# Patient Record
Sex: Male | Born: 1940 | Race: White | Hispanic: No | Smoking: Former smoker
Health system: Southern US, Community
[De-identification: ages and names within clinical notes are randomized; demographics above are authoritative.]

## PROBLEM LIST (undated history)

## (undated) DIAGNOSIS — I2781 Cor pulmonale (chronic): Secondary | ICD-10-CM

## (undated) DIAGNOSIS — I272 Pulmonary hypertension, unspecified: Secondary | ICD-10-CM

## (undated) DIAGNOSIS — K560 Paralytic ileus: Secondary | ICD-10-CM

## (undated) DIAGNOSIS — I4891 Unspecified atrial fibrillation: Secondary | ICD-10-CM

## (undated) DIAGNOSIS — K922 Gastrointestinal hemorrhage, unspecified: Secondary | ICD-10-CM

## (undated) DIAGNOSIS — G4730 Sleep apnea, unspecified: Secondary | ICD-10-CM

## (undated) DIAGNOSIS — Z7901 Long term (current) use of anticoagulants: Secondary | ICD-10-CM

## (undated) DIAGNOSIS — G4733 Obstructive sleep apnea (adult) (pediatric): Principal | ICD-10-CM

## (undated) DIAGNOSIS — K635 Polyp of colon: Secondary | ICD-10-CM

## (undated) DIAGNOSIS — E7800 Pure hypercholesterolemia, unspecified: Secondary | ICD-10-CM

## (undated) DIAGNOSIS — R0602 Shortness of breath: Secondary | ICD-10-CM

## (undated) DIAGNOSIS — I8393 Asymptomatic varicose veins of bilateral lower extremities: Secondary | ICD-10-CM

## (undated) DIAGNOSIS — K8020 Calculus of gallbladder without cholecystitis without obstruction: Secondary | ICD-10-CM

## (undated) DIAGNOSIS — M1990 Unspecified osteoarthritis, unspecified site: Secondary | ICD-10-CM

## (undated) DIAGNOSIS — F419 Anxiety disorder, unspecified: Secondary | ICD-10-CM

## (undated) DIAGNOSIS — I10 Essential (primary) hypertension: Secondary | ICD-10-CM

## (undated) DIAGNOSIS — K259 Gastric ulcer, unspecified as acute or chronic, without hemorrhage or perforation: Secondary | ICD-10-CM

## (undated) DIAGNOSIS — I50812 Chronic right heart failure: Secondary | ICD-10-CM

## (undated) DIAGNOSIS — K219 Gastro-esophageal reflux disease without esophagitis: Secondary | ICD-10-CM

## (undated) HISTORY — DX: Gastro-esophageal reflux disease without esophagitis: K21.9

## (undated) HISTORY — DX: Sleep apnea, unspecified: G47.30

## (undated) HISTORY — DX: Polyp of colon: K63.5

## (undated) HISTORY — PX: COLONOSCOPY: SHX174

## (undated) HISTORY — DX: Calculus of gallbladder without cholecystitis without obstruction: K80.20

## (undated) HISTORY — DX: Obstructive sleep apnea (adult) (pediatric): G47.33

## (undated) HISTORY — PX: MOUTH SURGERY: SHX715

## (undated) HISTORY — DX: Gastrointestinal hemorrhage, unspecified: K92.2

## (undated) HISTORY — PX: OTHER SURGICAL HISTORY: SHX169

## (undated) HISTORY — DX: Gastric ulcer, unspecified as acute or chronic, without hemorrhage or perforation: K25.9

## (undated) HISTORY — PX: CARDIOVASCULAR STRESS TEST: SHX262

## (undated) HISTORY — DX: Paralytic ileus: K56.0

---

## 2000-01-23 ENCOUNTER — Emergency Department (HOSPITAL_COMMUNITY): Admission: EM | Admit: 2000-01-23 | Discharge: 2000-01-23 | Payer: Self-pay | Admitting: Emergency Medicine

## 2000-02-23 ENCOUNTER — Encounter: Payer: Self-pay | Admitting: Emergency Medicine

## 2000-02-24 ENCOUNTER — Inpatient Hospital Stay (HOSPITAL_COMMUNITY): Admission: EM | Admit: 2000-02-24 | Discharge: 2000-02-27 | Payer: Self-pay | Admitting: Emergency Medicine

## 2000-02-24 ENCOUNTER — Encounter: Payer: Self-pay | Admitting: Surgery

## 2000-02-25 ENCOUNTER — Encounter: Payer: Self-pay | Admitting: Surgery

## 2003-04-25 ENCOUNTER — Emergency Department (HOSPITAL_COMMUNITY): Admission: EM | Admit: 2003-04-25 | Discharge: 2003-04-25 | Payer: Self-pay | Admitting: *Deleted

## 2003-04-25 ENCOUNTER — Encounter: Payer: Self-pay | Admitting: Emergency Medicine

## 2006-11-16 ENCOUNTER — Emergency Department (HOSPITAL_COMMUNITY): Admission: EM | Admit: 2006-11-16 | Discharge: 2006-11-17 | Payer: Self-pay | Admitting: Emergency Medicine

## 2006-11-16 ENCOUNTER — Ambulatory Visit: Payer: Self-pay | Admitting: Psychiatry

## 2006-11-17 ENCOUNTER — Inpatient Hospital Stay (HOSPITAL_COMMUNITY): Admission: AD | Admit: 2006-11-17 | Discharge: 2006-11-23 | Payer: Self-pay | Admitting: Psychiatry

## 2006-11-26 ENCOUNTER — Other Ambulatory Visit (HOSPITAL_COMMUNITY): Admission: RE | Admit: 2006-11-26 | Discharge: 2007-02-24 | Payer: Self-pay | Admitting: Psychiatry

## 2011-06-15 ENCOUNTER — Other Ambulatory Visit: Payer: Self-pay | Admitting: Cardiology

## 2011-06-15 DIAGNOSIS — I872 Venous insufficiency (chronic) (peripheral): Secondary | ICD-10-CM

## 2011-06-15 DIAGNOSIS — I83899 Varicose veins of unspecified lower extremities with other complications: Secondary | ICD-10-CM

## 2011-06-15 DIAGNOSIS — I8390 Asymptomatic varicose veins of unspecified lower extremity: Secondary | ICD-10-CM

## 2011-06-27 ENCOUNTER — Other Ambulatory Visit: Payer: Self-pay

## 2011-06-28 ENCOUNTER — Ambulatory Visit
Admission: RE | Admit: 2011-06-28 | Discharge: 2011-06-28 | Disposition: A | Discharge status: Home / Self Care | Payer: Medicare Other | Source: Ambulatory Visit | Attending: Cardiology | Admitting: Cardiology

## 2011-06-28 DIAGNOSIS — I83899 Varicose veins of unspecified lower extremities with other complications: Secondary | ICD-10-CM

## 2011-06-28 DIAGNOSIS — I8390 Asymptomatic varicose veins of unspecified lower extremity: Secondary | ICD-10-CM

## 2011-06-28 DIAGNOSIS — I872 Venous insufficiency (chronic) (peripheral): Secondary | ICD-10-CM

## 2011-06-28 HISTORY — DX: Morbid (severe) obesity due to excess calories: E66.01

## 2011-06-28 HISTORY — DX: Asymptomatic varicose veins of bilateral lower extremities: I83.93

## 2011-06-28 HISTORY — DX: Long term (current) use of anticoagulants: Z79.01

## 2011-06-28 HISTORY — DX: Pure hypercholesterolemia, unspecified: E78.00

## 2011-06-28 HISTORY — DX: Essential (primary) hypertension: I10

## 2011-06-28 HISTORY — DX: Unspecified atrial fibrillation: I48.91

## 2011-06-28 NOTE — Progress Notes (Signed)
Bilateral edema of lower extremities, Right greater than Left.  Denies pain.  Burning sensation.  Skin color changes.  Thickened nails & skin of lower extremities.    Right foot discomfort during ambulation.  Not currently exercising.

## 2012-05-16 HISTORY — PX: TRANSTHORACIC ECHOCARDIOGRAM: SHX275

## 2012-10-24 ENCOUNTER — Encounter: Payer: Medicare Other | Attending: Family Medicine | Admitting: Dietician

## 2012-10-24 VITALS — Ht 72.0 in | Wt 328.7 lb

## 2012-10-24 DIAGNOSIS — E119 Type 2 diabetes mellitus without complications: Secondary | ICD-10-CM | POA: Insufficient documentation

## 2012-10-24 DIAGNOSIS — Z713 Dietary counseling and surveillance: Secondary | ICD-10-CM | POA: Insufficient documentation

## 2012-10-26 ENCOUNTER — Encounter: Payer: Self-pay | Admitting: Dietician

## 2012-10-26 NOTE — Progress Notes (Signed)
  Patient was seen on 10/24/2012 for the first of a series of three diabetes self-management courses at the [facility]. The following learning objectives were met by the patient during this course: Current A1C: 6.0  10/02/2012  Defines the role of glucose and insulin  Identifies type of diabetes and pathophysiology  Defines the diagnostic criteria for diabetes and prediabetes  States the risk factors for Type 2 Diabetes  States the symptoms of Type 2 Diabetes  Defines Type 2 Diabetes treatment goals  Defines Type 2 Diabetes treatment options  States the rationale for glucose monitoring  Identifies A1C, glucose targets, and testing times  Identifies proper sharps disposal  Defines the purpose of a diabetes food plan  Identifies carbohydrate food groups  Defines effects of carbohydrate foods on glucose levels  Identifies carbohydrate choices/grams/food labels  States benefits of physical activity and effect on glucose  Review of suggested activity guidelines  Handouts given during class include:  Type 2 Diabetes: Basics Book  My Food Plan Book  Food and Activity Log  Patient has established the following initial goals:  Follow a diabetes Meal Plan  Increase exercise  Lose weight  Follow-Up Plan: Take DM Core Classes 2 & 3

## 2012-10-31 ENCOUNTER — Encounter: Payer: Medicare Other | Admitting: Dietician

## 2012-10-31 DIAGNOSIS — E119 Type 2 diabetes mellitus without complications: Secondary | ICD-10-CM

## 2012-10-31 NOTE — Progress Notes (Signed)
  Patient was seen on 10/31/2012 for the second of a series of three diabetes self-management courses at the [facility]. The following learning objectives were met by the patient during this course:   Explain basic nutrition maintenance and quality assurance  Describe causes, symptoms and treatment of hypoglycemia and hyperglycemia  Explain how to manage diabetes during illness  Describe the importance of good nutrition for health and healthy eating strategies  List strategies to follow meal plan when dining out  Describe the effects of alcohol on glucose and how to use it safely  Describe problem solving skills for day-to-day glucose challenges  Describe strategies to use when treatment plan needs to change  Identify important factors involved in successful weight loss  Describe ways to remain physically active  Describe the impact of regular activity on insulin resistance  Handouts given in class:  Refrigerator magnet for Sick Day Guidelines  [facility] Oral medication/insulin handout  Nutritional Strategies for Weight Loss with Diabetes  Follow-Up Plan: Patient will attend the final class of the ADA Diabetes Self-Care Education.

## 2012-11-14 ENCOUNTER — Encounter: Payer: Medicare Other | Admitting: *Deleted

## 2012-11-14 DIAGNOSIS — E119 Type 2 diabetes mellitus without complications: Secondary | ICD-10-CM

## 2012-11-14 NOTE — Patient Instructions (Signed)
Goals:  Follow Diabetes Meal Plan as instructed  Eat 3 meals and 2 snacks, every 3-5 hrs  Limit carbohydrate intake to 60 grams carbohydrate/meal  Limit carbohydrate intake to 30 grams carbohydrate/snack  Add lean protein foods to meals/snacks  Monitor glucose levels as instructed by your doctor  Aim for 30 mins of physical activity daily  Bring food record and glucose log to your next nutrition visit   

## 2012-11-14 NOTE — Progress Notes (Signed)
  Patient was seen on 11/14/12 for the third of a series of three diabetes self-management courses at the [facility]. The following learning objectives were met by the patient during this course:    Describe how diabetes changes over time   Identify diabetes complications and ways to prevent them   Describe strategies that can promote heart health including lowering blood pressure and cholesterol   Describe strategies to lower dietary fat and sodium in the diet   Identify physical activities that benefit cardiovascular health   Evaluate success in meeting personal goal   Describe the belief that they can live successfully with diabetes day to day   Establish 2-3 goals that they will plan to diligently work on until they return for the free 90-month follow-up visit  The following handouts were given in class:  3 Month Follow Up Visit handout  Goal setting handout  Class evaluation form  Your patient has established the following 3 month goals for diabetes self-care:  Count carbohydrates at most meals and snacks  Set a plan for exercise  Start testing glucose and look for patterns in record at least 8 days a month  Follow-Up Plan: Patient will attend a 3 month follow-up visit for diabetes self-management education.

## 2013-02-11 ENCOUNTER — Ambulatory Visit: Payer: Medicare Other | Admitting: Dietician

## 2013-02-13 ENCOUNTER — Ambulatory Visit: Payer: Medicare Other | Admitting: *Deleted

## 2013-02-18 ENCOUNTER — Ambulatory Visit: Payer: Medicare Other | Admitting: Dietician

## 2013-03-25 ENCOUNTER — Other Ambulatory Visit: Payer: Self-pay | Admitting: Orthopedic Surgery

## 2013-04-10 ENCOUNTER — Encounter (HOSPITAL_BASED_OUTPATIENT_CLINIC_OR_DEPARTMENT_OTHER): Payer: Self-pay | Admitting: *Deleted

## 2013-04-10 NOTE — Progress Notes (Signed)
REVIEWED PT CHART AND NOTE FROM CARDIOLOGIST, DR Jacinto Halim, FAXED FROM DR Simonne Come. ALSO, STARTED HX INTERVIEW VIA PHONE, PT IS 5'11" AND 360LB (PT STATES THIS WT DONE AT PCP THIS WEEK).  PT IS OVER OUR WT LIMIT PER POLICY AND SIG. CARDIAC ISSUES PT NEEDS TO BE DONE AT MAIN OR. CALL AND SPOKE W/ KAREN , OR SCHEDULER FOR DR APLINGTON, INFORMED OF THIS AND THAT PT STATED HE DID NOT HAVE LOVENOX RX AS DR Jacinto Halim HAD RECOMMENDED, KAREN STATES SHE WILL TAKE CARE OF THIS AND CALL PT BY THE END OF THE DAY.

## 2013-04-16 ENCOUNTER — Encounter (HOSPITAL_COMMUNITY): Payer: Self-pay | Admitting: Pharmacy Technician

## 2013-04-17 NOTE — Patient Instructions (Signed)
Edwin [name]  04/17/2013   Your procedure is scheduled on:  04/23/13    Report to [facility] at  1045  AM.  Call this number if you have problems the morning of surgery: [phone]   Remember:   Do not eat food or drink liquids after midnight.   Take these medicines the morning of surgery with A SIP OF WATER:    Do not wear jewelry,  Do not wear lotions, powders, or perfumes.  . Men may shave face and neck.  Do not bring valuables to the hospital.  Contacts, dentures or bridgework may not be worn into surgery.       Patients discharged the day of surgery will not be allowed to drive  home.  Name and phone number of your driver:    SEE CHG INSTRUCTION SHEET    Please read over the following fact sheets that you were given: MRSA Information, coughing and deep breathing exercises, leg exercises               Failure to comply with these instructions may result in cancellation of your surgery.                Patient Signature ____________________________              Nurse Signature _____________________________

## 2013-04-18 ENCOUNTER — Encounter (HOSPITAL_COMMUNITY): Payer: Self-pay

## 2013-04-18 ENCOUNTER — Ambulatory Visit (HOSPITAL_COMMUNITY)
Admission: RE | Admit: 2013-04-18 | Discharge: 2013-04-18 | Disposition: A | Discharge status: Home / Self Care | Payer: MEDICARE | Source: Ambulatory Visit | Attending: Orthopedic Surgery | Admitting: Orthopedic Surgery

## 2013-04-18 ENCOUNTER — Encounter (HOSPITAL_COMMUNITY)
Admission: RE | Admit: 2013-04-18 | Discharge: 2013-04-18 | Disposition: A | Discharge status: Home / Self Care | Payer: MEDICARE | Source: Ambulatory Visit | Attending: Orthopedic Surgery | Admitting: Orthopedic Surgery

## 2013-04-18 ENCOUNTER — Ambulatory Visit (HOSPITAL_BASED_OUTPATIENT_CLINIC_OR_DEPARTMENT_OTHER): Admit: 2013-04-18 | Payer: Medicare Other | Admitting: Orthopedic Surgery

## 2013-04-18 ENCOUNTER — Encounter (HOSPITAL_BASED_OUTPATIENT_CLINIC_OR_DEPARTMENT_OTHER): Payer: Self-pay

## 2013-04-18 DIAGNOSIS — I10 Essential (primary) hypertension: Secondary | ICD-10-CM | POA: Insufficient documentation

## 2013-04-18 DIAGNOSIS — R0602 Shortness of breath: Secondary | ICD-10-CM | POA: Insufficient documentation

## 2013-04-18 DIAGNOSIS — I517 Cardiomegaly: Secondary | ICD-10-CM | POA: Insufficient documentation

## 2013-04-18 DIAGNOSIS — E119 Type 2 diabetes mellitus without complications: Secondary | ICD-10-CM | POA: Insufficient documentation

## 2013-04-18 DIAGNOSIS — Z01818 Encounter for other preprocedural examination: Secondary | ICD-10-CM | POA: Insufficient documentation

## 2013-04-18 HISTORY — DX: Anxiety disorder, unspecified: F41.9

## 2013-04-18 HISTORY — DX: Unspecified osteoarthritis, unspecified site: M19.90

## 2013-04-18 HISTORY — DX: Chronic right heart failure: I50.812

## 2013-04-18 HISTORY — DX: Pulmonary hypertension, unspecified: I27.20

## 2013-04-18 HISTORY — DX: Cor pulmonale (chronic): I27.81

## 2013-04-18 HISTORY — DX: Shortness of breath: R06.02

## 2013-04-18 LAB — CBC
Hemoglobin: 12.9 g/dL — ABNORMAL LOW (ref 13.0–17.0)
MCH: 31.5 pg (ref 26.0–34.0)
MCHC: 33.8 g/dL (ref 30.0–36.0)
MCV: 93.4 fL (ref 78.0–100.0)

## 2013-04-18 LAB — BASIC METABOLIC PANEL
BUN: 9 mg/dL (ref 6–23)
Calcium: 8.9 mg/dL (ref 8.4–10.5)
Creatinine, Ser: 0.96 mg/dL (ref 0.50–1.35)
GFR calc non Af Amer: 82 mL/min — ABNORMAL LOW (ref >90)
Glucose, Bld: 196 mg/dL — ABNORMAL HIGH (ref 70–99)
Sodium: 134 mEq/L — ABNORMAL LOW (ref 135–145)

## 2013-04-18 LAB — PROTIME-INR: INR: 1.44 (ref 0.00–1.49)

## 2013-04-18 LAB — SURGICAL PCR SCREEN: Staphylococcus aureus: NEGATIVE

## 2013-04-18 SURGERY — ARTHROSCOPY, KNEE, WITH MEDIAL MENISCECTOMY
Anesthesia: General | Site: Knee | Laterality: Left

## 2013-04-18 NOTE — Progress Notes (Signed)
05/29/11 Carotid study on chart ECHO 05/16/12 on chart  Stress 07/19/12 on chart  01/13/13 Last office visit note with Dr Jacinto Halim on chart  EKG 01/13/13 on chart

## 2013-04-18 NOTE — Progress Notes (Signed)
Your patient has screened at an elevated risk for Obstructive Sleep Apnea using the STOP-Bang tool during a presurgical visit.  A score of 4 or greater is considered an elevated risk.  

## 2013-04-23 ENCOUNTER — Encounter (HOSPITAL_COMMUNITY)
Admission: RE | Disposition: A | Discharge status: Home / Self Care | Payer: Self-pay | Source: Ambulatory Visit | Attending: Orthopedic Surgery

## 2013-04-23 ENCOUNTER — Ambulatory Visit (HOSPITAL_COMMUNITY)
Admission: RE | Admit: 2013-04-23 | Discharge: 2013-04-23 | Disposition: A | Discharge status: Home / Self Care | Payer: MEDICARE | Source: Ambulatory Visit | Attending: Orthopedic Surgery | Admitting: Orthopedic Surgery

## 2013-04-23 ENCOUNTER — Ambulatory Visit (HOSPITAL_COMMUNITY): Payer: MEDICARE | Admitting: Anesthesiology

## 2013-04-23 ENCOUNTER — Encounter (HOSPITAL_COMMUNITY): Payer: Self-pay | Admitting: Anesthesiology

## 2013-04-23 ENCOUNTER — Encounter (HOSPITAL_COMMUNITY): Payer: Self-pay | Admitting: *Deleted

## 2013-04-23 DIAGNOSIS — Z7901 Long term (current) use of anticoagulants: Secondary | ICD-10-CM | POA: Insufficient documentation

## 2013-04-23 DIAGNOSIS — I10 Essential (primary) hypertension: Secondary | ICD-10-CM | POA: Insufficient documentation

## 2013-04-23 DIAGNOSIS — I509 Heart failure, unspecified: Secondary | ICD-10-CM | POA: Insufficient documentation

## 2013-04-23 DIAGNOSIS — Z9889 Other specified postprocedural states: Secondary | ICD-10-CM

## 2013-04-23 DIAGNOSIS — G4730 Sleep apnea, unspecified: Secondary | ICD-10-CM | POA: Insufficient documentation

## 2013-04-23 DIAGNOSIS — IMO0002 Reserved for concepts with insufficient information to code with codable children: Secondary | ICD-10-CM | POA: Insufficient documentation

## 2013-04-23 DIAGNOSIS — Z79899 Other long term (current) drug therapy: Secondary | ICD-10-CM | POA: Insufficient documentation

## 2013-04-23 DIAGNOSIS — X58XXXA Exposure to other specified factors, initial encounter: Secondary | ICD-10-CM | POA: Insufficient documentation

## 2013-04-23 DIAGNOSIS — E119 Type 2 diabetes mellitus without complications: Secondary | ICD-10-CM | POA: Insufficient documentation

## 2013-04-23 DIAGNOSIS — I4891 Unspecified atrial fibrillation: Secondary | ICD-10-CM | POA: Insufficient documentation

## 2013-04-23 HISTORY — PX: KNEE ARTHROSCOPY WITH MEDIAL MENISECTOMY: SHX5651

## 2013-04-23 LAB — GLUCOSE, CAPILLARY

## 2013-04-23 SURGERY — ARTHROSCOPY, KNEE, WITH MEDIAL MENISCECTOMY
Anesthesia: General | Site: Knee | Laterality: Left | Wound class: Clean

## 2013-04-23 MED ORDER — LACTATED RINGERS IR SOLN
Status: DC | PRN
  Administered 2013-04-23: 6000 mL

## 2013-04-23 MED ORDER — MORPHINE SULFATE 4 MG/ML IJ SOLN
INTRAMUSCULAR | Status: AC
  Filled 2013-04-23: qty 1

## 2013-04-23 MED ORDER — PROMETHAZINE HCL 25 MG/ML IJ SOLN: 6.2500 mg | INTRAMUSCULAR | Status: DC | PRN

## 2013-04-23 MED ORDER — POVIDONE-IODINE 7.5 % EX SOLN: Freq: Once | CUTANEOUS | Status: DC

## 2013-04-23 MED ORDER — NEOSTIGMINE METHYLSULFATE 1 MG/ML IJ SOLN
INTRAMUSCULAR | Status: DC | PRN
  Administered 2013-04-23: 3 mg via INTRAVENOUS

## 2013-04-23 MED ORDER — EPINEPHRINE HCL 1 MG/ML IJ SOLN
INTRAMUSCULAR | Status: AC
  Filled 2013-04-23: qty 1

## 2013-04-23 MED ORDER — EPINEPHRINE HCL 1 MG/ML IJ SOLN
INTRAMUSCULAR | Status: DC | PRN
  Administered 2013-04-23 (×2): 1 mg

## 2013-04-23 MED ORDER — PROPOFOL 10 MG/ML IV BOLUS
INTRAVENOUS | Status: DC | PRN
  Administered 2013-04-23: 310 mg via INTRAVENOUS

## 2013-04-23 MED ORDER — BUPIVACAINE-EPINEPHRINE 0.5% -1:200000 IJ SOLN
INTRAMUSCULAR | Status: AC
  Filled 2013-04-23: qty 1

## 2013-04-23 MED ORDER — KETOROLAC TROMETHAMINE 30 MG/ML IJ SOLN
INTRAMUSCULAR | Status: DC | PRN
  Administered 2013-04-23: 30 mg via INTRAVENOUS

## 2013-04-23 MED ORDER — MORPHINE SULFATE 4 MG/ML IJ SOLN
INTRAMUSCULAR | Status: DC | PRN
  Administered 2013-04-23: 4 mg via SUBCUTANEOUS

## 2013-04-23 MED ORDER — ROCURONIUM BROMIDE 100 MG/10ML IV SOLN
INTRAVENOUS | Status: DC | PRN
  Administered 2013-04-23: 20 mg via INTRAVENOUS

## 2013-04-23 MED ORDER — FENTANYL CITRATE 0.05 MG/ML IJ SOLN: 25.0000 ug | INTRAMUSCULAR | Status: DC | PRN

## 2013-04-23 MED ORDER — 0.9 % SODIUM CHLORIDE (POUR BTL) OPTIME
TOPICAL | Status: DC | PRN
  Administered 2013-04-23: 1000 mL

## 2013-04-23 MED ORDER — LIDOCAINE HCL (CARDIAC) 20 MG/ML IV SOLN
INTRAVENOUS | Status: DC | PRN
  Administered 2013-04-23: 100 mg via INTRAVENOUS

## 2013-04-23 MED ORDER — KETOROLAC TROMETHAMINE 30 MG/ML IJ SOLN: 15.0000 mg | Freq: Once | INTRAMUSCULAR | Status: DC | PRN

## 2013-04-23 MED ORDER — BUPIVACAINE-EPINEPHRINE 0.5% -1:200000 IJ SOLN
INTRAMUSCULAR | Status: DC | PRN
  Administered 2013-04-23: 20 mL

## 2013-04-23 MED ORDER — ONDANSETRON HCL 4 MG/2ML IJ SOLN
INTRAMUSCULAR | Status: DC | PRN
  Administered 2013-04-23: 4 mg via INTRAVENOUS

## 2013-04-23 MED ORDER — FENTANYL CITRATE 0.05 MG/ML IJ SOLN
INTRAMUSCULAR | Status: DC | PRN
  Administered 2013-04-23 (×2): 50 ug via INTRAVENOUS

## 2013-04-23 MED ORDER — HYDROCODONE-ACETAMINOPHEN 10-325 MG PO TABS
1.0000 | ORAL_TABLET | Freq: Four times a day (QID) | ORAL | Status: DC | PRN
  Administered 2013-04-23: 1 via ORAL
  Filled 2013-04-23: qty 1

## 2013-04-23 MED ORDER — HYDROCODONE-ACETAMINOPHEN 10-325 MG PO TABS: 1.0000 | ORAL_TABLET | Freq: Four times a day (QID) | ORAL | Status: DC | PRN

## 2013-04-23 MED ORDER — MIDAZOLAM HCL 5 MG/5ML IJ SOLN
INTRAMUSCULAR | Status: DC | PRN
  Administered 2013-04-23: 1 mg via INTRAVENOUS

## 2013-04-23 MED ORDER — LACTATED RINGERS IV SOLN
INTRAVENOUS | Status: DC
  Administered 2013-04-23: 1000 mL via INTRAVENOUS

## 2013-04-23 MED ORDER — GLYCOPYRROLATE 0.2 MG/ML IJ SOLN
INTRAMUSCULAR | Status: DC | PRN
  Administered 2013-04-23: .4 mg via INTRAVENOUS

## 2013-04-23 MED ORDER — SUCCINYLCHOLINE CHLORIDE 20 MG/ML IJ SOLN
INTRAMUSCULAR | Status: DC | PRN
  Administered 2013-04-23: 200 mg via INTRAVENOUS

## 2013-04-23 MED ORDER — DEXAMETHASONE SODIUM PHOSPHATE 10 MG/ML IJ SOLN
INTRAMUSCULAR | Status: DC | PRN
  Administered 2013-04-23: 10 mg via INTRAVENOUS

## 2013-04-23 SURGICAL SUPPLY — 34 items
BANDAGE ELASTIC 6 VELCRO ST LF (GAUZE/BANDAGES/DRESSINGS) ×2 IMPLANT
BANDAGE GAUZE ELAST BULKY 4 IN (GAUZE/BANDAGES/DRESSINGS) ×2 IMPLANT
BLADE 4.2CUDA (BLADE) IMPLANT
BLADE CUDA SHAVER 3.5 (BLADE) ×4 IMPLANT
CLOTH BEACON ORANGE TIMEOUT ST (SAFETY) ×2 IMPLANT
CUFF TOURN SGL QUICK 34 (TOURNIQUET CUFF) ×2
CUFF TRNQT CYL 34X4X40X1 (TOURNIQUET CUFF) ×1 IMPLANT
DRSG EMULSION OIL 3X3 NADH (GAUZE/BANDAGES/DRESSINGS) ×2 IMPLANT
DRSG PAD ABDOMINAL 8X10 ST (GAUZE/BANDAGES/DRESSINGS) ×2 IMPLANT
DURAPREP 26ML APPLICATOR (WOUND CARE) ×2 IMPLANT
ELECT REM PT RETURN 9FT ADLT (ELECTROSURGICAL) ×2
ELECTRODE REM PT RTRN 9FT ADLT (ELECTROSURGICAL) ×1 IMPLANT
GLOVE BIO SURGEON STRL SZ7.5 (GLOVE) ×2 IMPLANT
GLOVE BIO SURGEON STRL SZ8 (GLOVE) ×2 IMPLANT
GLOVE ECLIPSE 8.0 STRL XLNG CF (GLOVE) ×6 IMPLANT
GLOVE INDICATOR 8.0 STRL GRN (GLOVE) ×4 IMPLANT
GLOVE SURG SS PI 7.5 STRL IVOR (GLOVE) ×1 IMPLANT
GLOVE SURG SS PI 8.5 STRL IVOR (GLOVE) ×1
GLOVE SURG SS PI 8.5 STRL STRW (GLOVE) IMPLANT
GOWN STRL REIN 2XL XLG LVL4 (GOWN DISPOSABLE) ×1 IMPLANT
IV LACTATED RINGER IRRG 3000ML (IV SOLUTION) ×4
IV LR IRRIG 3000ML ARTHROMATIC (IV SOLUTION) IMPLANT
MANIFOLD NEPTUNE II (INSTRUMENTS) ×3 IMPLANT
NS IRRIG 1000ML POUR BTL (IV SOLUTION) ×1 IMPLANT
PACK ARTHROSCOPY WL (CUSTOM PROCEDURE TRAY) ×2 IMPLANT
PAD CAST 4YDX4 CTTN HI CHSV (CAST SUPPLIES) ×1 IMPLANT
PADDING CAST COTTON 4X4 STRL (CAST SUPPLIES) ×2
POSITIONER SURGICAL ARM (MISCELLANEOUS) ×2 IMPLANT
SET ARTHROSCOPY TUBING (MISCELLANEOUS) ×2
SET ARTHROSCOPY TUBING LN (MISCELLANEOUS) ×1 IMPLANT
SPONGE GAUZE 4X4 12PLY (GAUZE/BANDAGES/DRESSINGS) ×1 IMPLANT
SUT ETHILON 4 0 PS 2 18 (SUTURE) ×2 IMPLANT
WAND 90 DEG TURBOVAC W/CORD (SURGICAL WAND) ×1 IMPLANT
WRAP KNEE MAXI GEL POST OP (GAUZE/BANDAGES/DRESSINGS) ×2 IMPLANT

## 2013-04-23 NOTE — Anesthesia Preprocedure Evaluation (Addendum)
Anesthesia Evaluation  Patient identified by MRN, date of birth, ID band Patient awake    Reviewed: Allergy & Precautions, H&P , NPO status , Patient's Chart, lab work & pertinent test results  History of Anesthesia Complications (+) AWARENESS UNDER ANESTHESIA  Airway Mallampati: III TM Distance: <3 FB Neck ROM: Limited    Dental no notable dental hx.    Pulmonary sleep apnea ,  breath sounds clear to auscultation  + decreased breath sounds      Cardiovascular hypertension, Pt. on medications +CHF + dysrhythmias Atrial Fibrillation Rhythm:Irregular Rate:Normal     Neuro/Psych negative neurological ROS  negative psych ROS   GI/Hepatic negative GI ROS, Neg liver ROS,   Endo/Other  diabetes, Oral Hypoglycemic AgentsMorbid obesity  Renal/GU negative Renal ROS  negative genitourinary   Musculoskeletal negative musculoskeletal ROS (+)   Abdominal   Peds negative pediatric ROS (+)  Hematology negative hematology ROS (+)   Anesthesia Other Findings   Reproductive/Obstetrics negative OB ROS                        Anesthesia Physical Anesthesia Plan  ASA: III  Anesthesia Plan: General   Post-op Pain Management:    Induction: Intravenous  Airway Management Planned: Oral ETT  Additional Equipment:   Intra-op Plan:   Post-operative Plan: Extubation in OR  Informed Consent: I have reviewed the patients History and Physical, chart, labs and discussed the procedure including the risks, benefits and alternatives for the proposed anesthesia with the patient or authorized representative who has indicated his/her understanding and acceptance.   Dental advisory given  Plan Discussed with: CRNA and Surgeon  Anesthesia Plan Comments: (Fluid restrict)       Anesthesia Quick Evaluation

## 2013-04-23 NOTE — Anesthesia Postprocedure Evaluation (Signed)
  Anesthesia Post-op Note  Patient: Edwin [name]  Procedure(s) Performed: Procedure(s) (LRB): LEFT KNEE ARTHROSCOPY WITH PARTIAL MEDIAL MENISECTOMY (Left)  Patient Location: PACU  Anesthesia Type: General  Level of Consciousness: awake and alert   Airway and Oxygen Therapy: Patient Spontanous Breathing  Post-op Pain: mild  Post-op Assessment: Post-op Vital signs reviewed, Patient's Cardiovascular Status Stable, Respiratory Function Stable, Patent Airway and No signs of Nausea or vomiting  Last Vitals:  Filed Vitals:   04/23/13 1710  BP: 144/77  Pulse: 58  Temp: 36.7 C  Resp: 16    Post-op Vital Signs: stable   Complications: No apparent anesthesia complications

## 2013-04-23 NOTE — H&P (Signed)
Edwin [name] is an 72 y.o. male.   Chief Complaintpainful lt knee HPI:MRI demonstrates a torn medial meniscus  Past Medical History  Diagnosis Date  . Hypertension     benign essential hypertension  . Diabetes mellitus     NIDDM  . Morbid obesity   . Pure hypercholesterolemia   . Long-term (current) use of anticoagulants   . Varicose veins of legs   . Pulmonary hypertension MODERATE  . Cor pulmonale, chronic   . Chronic right-sided CHF (congestive heart failure)   . Atrial fibrillation CARDIOLOGIST-- DR Jacinto Halim  . Anxiety   . Shortness of breath     with exertion   . Arthritis     Past Surgical History  Procedure Laterality Date  . Cardiovascular stress test  07-19-2012  DR Jacinto Halim    PROMINENT DIAPHRAGMATIC ATTENUATION/ NORMAL LVEF/ LOW RISK STUDY  . Transthoracic echocardiogram  05-16-2012    LOW NORMAL LVEF/ MOD. RV/ MILD HYPOKINESIS/ MOD. PULMONARY HTN/ CHRONIC COR PUMONALE/ NO SIG. CHANGE FROM 12-01-2010  . Mouth surgery    . Benign mole removal from nose       Family History  Problem Relation Age of Onset  . Cancer Paternal Grandfather   . Hypertension Paternal Grandfather   . Stroke Paternal Grandfather    Social History:  reports that he quit smoking about 28 years ago. His smoking use included Cigarettes. He smoked 1.00 pack per day. He has never used smokeless tobacco. He reports that  drinks alcohol. He reports that he does not use illicit drugs.  Allergies:  Allergies  Allergen Reactions  . Lisinopril Swelling    Tongue swelling    Medications Prior to Admission  Medication Sig Dispense Refill  . furosemide (LASIX) 40 MG tablet Take 80 mg by mouth 2 (two) times daily.       Marland Kitchen losartan (COZAAR) 50 MG tablet Take 50 mg by mouth every morning.       . metFORMIN (GLUCOPHAGE) 500 MG tablet Take 500 mg by mouth daily after supper.       . metoprolol succinate (TOPROL-XL) 25 MG 24 hr tablet Take 12.5 mg by mouth 2 (two) times daily.       . potassium chloride  SA (K-DUR,KLOR-CON) 20 MEQ tablet Take 20 mEq by mouth every morning.       . pravastatin (PRAVACHOL) 20 MG tablet Take 20 mg by mouth daily after supper.       Marland Kitchen spironolactone (ALDACTONE) 25 MG tablet Take 25 mg by mouth daily as needed (For fluid retention.).       Marland Kitchen acetaminophen (TYLENOL) 325 MG tablet Take 325 mg by mouth every 6 (six) hours as needed for pain.      Marland Kitchen enoxaparin (LOVENOX) 150 MG/ML injection Inject 150 mg into the skin daily.      . sildenafil (VIAGRA) 100 MG tablet Take 100 mg by mouth daily as needed for erectile dysfunction.       Marland Kitchen warfarin (COUMADIN) 5 MG tablet Take 2.5-5 mg by mouth daily after supper. He takes one tablet on Thursday and Sunday and half a tablet the rest of the week.        Results for orders placed during the hospital encounter of 04/23/13 (from the past 48 hour(s))  GLUCOSE, CAPILLARY     Status: Abnormal   Collection Time    04/23/13 11:18 AM      Result Value Range   Glucose-Capillary 182 (*) 70 - 99 mg/dL  No results found.  ROS  Blood pressure 170/79, pulse 63, temperature 97.4 F (36.3 C), temperature source Oral, resp. rate 18, SpO2 96.00%. Physical Exam  Constitutional: He is oriented to person, place, and time. He appears well-developed and well-nourished.  HENT:  Head: Normocephalic and atraumatic.  Right Ear: External ear normal.  Left Ear: External ear normal.  Nose: Nose normal.  Mouth/Throat: Oropharynx is clear and moist.  Eyes: Conjunctivae and EOM are normal. Pupils are equal, round, and reactive to light.  Neck: Normal range of motion. Neck supple.  Cardiovascular: Normal rate, regular rhythm, normal heart sounds and intact distal pulses.   Respiratory: Effort normal and breath sounds normal.  GI: Soft. Bowel sounds are normal.  Musculoskeletal: Normal range of motion. He exhibits tenderness.  Tender medial joint line left knee  Neurological: He is alert and oriented to person, place, and time. He has normal  reflexes.  Skin: Skin is warm and dry.  Psychiatric: He has a normal mood and affect. His behavior is normal. Judgment and thought content normal.     Assessment/Plan Torn medial meniscus lt knee Lt knee arthroscopy with partial medial meniscectomy  Edwin [name],[place] P 04/23/2013, 2:06 PM

## 2013-04-23 NOTE — Preoperative (Signed)
Beta Blockers   Reason not to administer Beta Blockers:Metroprolol taken at 0600 04-23-13

## 2013-04-23 NOTE — Transfer of Care (Signed)
Immediate Anesthesia Transfer of Care Note  Patient: Edwin [name]  Procedure(s) Performed: Procedure(s): LEFT KNEE ARTHROSCOPY WITH PARTIAL MEDIAL MENISECTOMY (Left)  Patient Location: PACU  Anesthesia Type:General  Level of Consciousness: awake, alert , oriented and patient cooperative  Airway & Oxygen Therapy: Patient Spontanous Breathing and Patient connected to face mask oxygen  Post-op Assessment: Report given to PACU RN, Post -op Vital signs reviewed and stable and Patient moving all extremities  Post vital signs: Reviewed and stable  Complications: No apparent anesthesia complications

## 2013-04-24 ENCOUNTER — Encounter (HOSPITAL_COMMUNITY): Payer: Self-pay | Admitting: Orthopedic Surgery

## 2013-04-24 NOTE — Op Note (Signed)
NAMELAMONTA, CYPRESS NO.:  [id number]  MEDICAL RECORD NO.:  [id number]  LOCATION:  [facility]                         FACILITY:  [facility]  PHYSICIAN:  Marlowe Kays, M.D.  DATE OF BIRTH:  October 09, 1941  DATE OF PROCEDURE:  04/23/2013 DATE OF DISCHARGE:  04/23/2013                              OPERATIVE REPORT   PREOPERATIVE DIAGNOSES: 1. Torn medial meniscus, left knee. 2. Morbid obesity.  POSTOPERATIVE DIAGNOSES: 1. Torn medial meniscus, left knee. 2. Morbid obesity.  OPERATION:  Left knee arthroscopy with partial medial meniscectomy.  SURGEON:  Marlowe Kays, M.D.  ASSISTANT:  Nurse.  ANESTHESIA:  General.  PATHOLOGY AND JUSTIFICATION FOR PROCEDURE:  He has had pain strictly medially.  MRI had shown a stress fracture of the medial femoral condyle, which asymptomatically has gone on to heal.  He has had continued pain over the medial joint line.  He has been cleared by the cardiologist and he will go back on his Coumadin tonight and has been on Lovenox until the last 24 hours.  At surgery, he had tears of both the anterior third of the medial meniscus and the posterior curve, which I shaved the anterior third and resected the posterior tear.  Remainder of his knee joint looked normal with minimal wear on the medial femoral condyle.  PROCEDURE:  Satisfactory general anesthesia.  Ace wrap, and knee support to right lower extremity.  Left lower extremity, pneumatic tourniquet applied and the leg Esmarch out non-sterilely and tourniquet inflated to 400 mmHg.  Time-out was performed.  Thigh stabilizer applied.  The left leg was then prepped with DuraPrep from stabilizer to ankle and draped in sterile field.  Superior medial saline inflow.  First, through an anterolateral portal, medial compartment knee joint was evaluated.  In the anterior joint, he had a good bit of reactive synovitis, which I started cleaning up with 3.5 shaver and found that he had scuffed  down the anterior third of the medial meniscus, which I shaved and trimmed back with arthroscopic scissors.  ACL was intact.  He had minimal wear of the medial femoral condyle.  Looking posteriorly, which was done with some difficulty because of the size.  The tear of the posterior curve medial meniscus was identified and resected back to stable rim with a combination of baskets and 3.5 shaver.  Looking at the medial gutter and suprapatellar area, he had minimal wear of the patella and nothing need to be treated there.  I then reversed portals, lateral joint looked normal.  Knee joint was then irrigated to clear and all fluids were possibly removed.  I closed the two entry portals with 4-0 nylon and then injected 20 mL of 0.5% Marcaine with adrenaline, 4 mg of morphine through the inflow apparatus, which was removed.  This portal was closed with 4-0 nylon as well.  Betadine, Adaptic, and dry sterile dressing were applied.  Tourniquet was released.  He tolerated the procedure well and was taken to the recovery room in satisfied condition with no known complications.          ______________________________ Marlowe Kays, M.D.     JA/MEDQ  D:  04/23/2013  T:  04/24/2013  Job:  [id number]

## 2013-06-23 ENCOUNTER — Inpatient Hospital Stay (HOSPITAL_COMMUNITY)
Admission: EM | Admit: 2013-06-23 | Discharge: 2013-07-08 | DRG: 871 | Disposition: A | Discharge status: Home / Self Care | Payer: MEDICARE | Attending: Internal Medicine | Admitting: Internal Medicine

## 2013-06-23 ENCOUNTER — Emergency Department (HOSPITAL_COMMUNITY): Payer: MEDICARE

## 2013-06-23 ENCOUNTER — Encounter (HOSPITAL_COMMUNITY): Payer: Self-pay | Admitting: Emergency Medicine

## 2013-06-23 DIAGNOSIS — N179 Acute kidney failure, unspecified: Secondary | ICD-10-CM | POA: Diagnosis not present

## 2013-06-23 DIAGNOSIS — I4891 Unspecified atrial fibrillation: Secondary | ICD-10-CM

## 2013-06-23 DIAGNOSIS — Z79899 Other long term (current) drug therapy: Secondary | ICD-10-CM

## 2013-06-23 DIAGNOSIS — E876 Hypokalemia: Secondary | ICD-10-CM | POA: Diagnosis present

## 2013-06-23 DIAGNOSIS — I498 Other specified cardiac arrhythmias: Secondary | ICD-10-CM | POA: Diagnosis present

## 2013-06-23 DIAGNOSIS — I509 Heart failure, unspecified: Secondary | ICD-10-CM

## 2013-06-23 DIAGNOSIS — E119 Type 2 diabetes mellitus without complications: Secondary | ICD-10-CM | POA: Diagnosis present

## 2013-06-23 DIAGNOSIS — I5033 Acute on chronic diastolic (congestive) heart failure: Secondary | ICD-10-CM | POA: Diagnosis present

## 2013-06-23 DIAGNOSIS — L0390 Cellulitis, unspecified: Secondary | ICD-10-CM

## 2013-06-23 DIAGNOSIS — R509 Fever, unspecified: Secondary | ICD-10-CM

## 2013-06-23 DIAGNOSIS — L0291 Cutaneous abscess, unspecified: Secondary | ICD-10-CM

## 2013-06-23 DIAGNOSIS — L02419 Cutaneous abscess of limb, unspecified: Secondary | ICD-10-CM | POA: Diagnosis present

## 2013-06-23 DIAGNOSIS — I5032 Chronic diastolic (congestive) heart failure: Secondary | ICD-10-CM

## 2013-06-23 DIAGNOSIS — K56609 Unspecified intestinal obstruction, unspecified as to partial versus complete obstruction: Secondary | ICD-10-CM | POA: Diagnosis present

## 2013-06-23 DIAGNOSIS — R6510 Systemic inflammatory response syndrome (SIRS) of non-infectious origin without acute organ dysfunction: Secondary | ICD-10-CM | POA: Diagnosis present

## 2013-06-23 DIAGNOSIS — R197 Diarrhea, unspecified: Secondary | ICD-10-CM

## 2013-06-23 DIAGNOSIS — K560 Paralytic ileus: Secondary | ICD-10-CM | POA: Diagnosis not present

## 2013-06-23 DIAGNOSIS — I2789 Other specified pulmonary heart diseases: Secondary | ICD-10-CM | POA: Diagnosis present

## 2013-06-23 DIAGNOSIS — E785 Hyperlipidemia, unspecified: Secondary | ICD-10-CM | POA: Diagnosis present

## 2013-06-23 DIAGNOSIS — L03119 Cellulitis of unspecified part of limb: Secondary | ICD-10-CM | POA: Diagnosis present

## 2013-06-23 DIAGNOSIS — Z7901 Long term (current) use of anticoagulants: Secondary | ICD-10-CM

## 2013-06-23 DIAGNOSIS — R933 Abnormal findings on diagnostic imaging of other parts of digestive tract: Secondary | ICD-10-CM | POA: Diagnosis present

## 2013-06-23 DIAGNOSIS — E7800 Pure hypercholesterolemia, unspecified: Secondary | ICD-10-CM | POA: Diagnosis present

## 2013-06-23 DIAGNOSIS — I10 Essential (primary) hypertension: Secondary | ICD-10-CM | POA: Diagnosis present

## 2013-06-23 DIAGNOSIS — A419 Sepsis, unspecified organism: Principal | ICD-10-CM | POA: Diagnosis present

## 2013-06-23 LAB — PRO B NATRIURETIC PEPTIDE: Pro B Natriuretic peptide (BNP): 2292 pg/mL — ABNORMAL HIGH (ref 0–125)

## 2013-06-23 LAB — URINALYSIS, ROUTINE W REFLEX MICROSCOPIC
Bilirubin Urine: NEGATIVE
Leukocytes, UA: NEGATIVE
Nitrite: NEGATIVE
Specific Gravity, Urine: 1.02 (ref 1.005–1.030)
pH: 5 (ref 5.0–8.0)

## 2013-06-23 LAB — PROTIME-INR: INR: 1.5 — ABNORMAL HIGH (ref 0.00–1.49)

## 2013-06-23 LAB — CBC WITH DIFFERENTIAL/PLATELET
Basophils Absolute: 0 10*3/uL (ref 0.0–0.1)
HCT: 36.6 % — ABNORMAL LOW (ref 39.0–52.0)
Hemoglobin: 12 g/dL — ABNORMAL LOW (ref 13.0–17.0)
Lymphocytes Relative: 3 % — ABNORMAL LOW (ref 12–46)
Monocytes Absolute: 0.9 10*3/uL (ref 0.1–1.0)
Monocytes Relative: 4 % (ref 3–12)
Neutro Abs: 21.3 10*3/uL — ABNORMAL HIGH (ref 1.7–7.7)
Neutrophils Relative %: 93 % — ABNORMAL HIGH (ref 43–77)
RDW: 14.8 % (ref 11.5–15.5)
WBC: 22.9 10*3/uL — ABNORMAL HIGH (ref 4.0–10.5)

## 2013-06-23 LAB — COMPREHENSIVE METABOLIC PANEL
ALT: 26 U/L (ref 0–53)
AST: 47 U/L — ABNORMAL HIGH (ref 0–37)
CO2: 26 mEq/L (ref 19–32)
Calcium: 8.8 mg/dL (ref 8.4–10.5)
Creatinine, Ser: 0.94 mg/dL (ref 0.50–1.35)
GFR calc non Af Amer: 82 mL/min — ABNORMAL LOW (ref >90)
Sodium: 132 mEq/L — ABNORMAL LOW (ref 135–145)
Total Protein: 7.4 g/dL (ref 6.0–8.3)

## 2013-06-23 LAB — URINE MICROSCOPIC-ADD ON

## 2013-06-23 LAB — ETHANOL: Alcohol, Ethyl (B): <11 mg/dL (ref 0–11)

## 2013-06-23 MED ORDER — HYDROCODONE-ACETAMINOPHEN 10-325 MG PO TABS
1.0000 | ORAL_TABLET | Freq: Four times a day (QID) | ORAL | Status: DC | PRN
  Administered 2013-06-25 – 2013-07-07 (×13): 1 via ORAL
  Filled 2013-06-23 (×14): qty 1

## 2013-06-23 MED ORDER — PIPERACILLIN-TAZOBACTAM 3.375 G IVPB 30 MIN
3.3750 g | Freq: Once | INTRAVENOUS | Status: AC
  Administered 2013-06-23: 3.375 g via INTRAVENOUS
  Filled 2013-06-23: qty 50

## 2013-06-23 MED ORDER — PIPERACILLIN-TAZOBACTAM 3.375 G IVPB
3.3750 g | Freq: Three times a day (TID) | INTRAVENOUS | Status: DC
  Administered 2013-06-24 – 2013-06-26 (×6): 3.375 g via INTRAVENOUS
  Filled 2013-06-23 (×8): qty 50

## 2013-06-23 MED ORDER — SODIUM CHLORIDE 0.9 % IJ SOLN
3.0000 mL | Freq: Two times a day (BID) | INTRAMUSCULAR | Status: DC
  Administered 2013-07-06: 3 mL via INTRAVENOUS

## 2013-06-23 MED ORDER — VANCOMYCIN HCL 10 G IV SOLR
2000.0000 mg | Freq: Once | INTRAVENOUS | Status: AC
  Administered 2013-06-23: 2000 mg via INTRAVENOUS
  Filled 2013-06-23: qty 2000

## 2013-06-23 MED ORDER — INSULIN ASPART 100 UNIT/ML ~~LOC~~ SOLN
0.0000 [IU] | Freq: Three times a day (TID) | SUBCUTANEOUS | Status: DC
  Administered 2013-06-24: 2 [IU] via SUBCUTANEOUS

## 2013-06-23 MED ORDER — ONDANSETRON HCL 4 MG/2ML IJ SOLN
4.0000 mg | Freq: Four times a day (QID) | INTRAMUSCULAR | Status: DC | PRN
  Administered 2013-07-01: 4 mg via INTRAVENOUS
  Filled 2013-06-23: qty 2

## 2013-06-23 MED ORDER — ACETAMINOPHEN 650 MG RE SUPP: 650.0000 mg | Freq: Four times a day (QID) | RECTAL | Status: DC | PRN

## 2013-06-23 MED ORDER — ACETAMINOPHEN 325 MG PO TABS
650.0000 mg | ORAL_TABLET | Freq: Four times a day (QID) | ORAL | Status: DC | PRN
  Administered 2013-06-30 – 2013-07-01 (×2): 650 mg via ORAL
  Filled 2013-06-23 (×2): qty 2

## 2013-06-23 MED ORDER — VANCOMYCIN HCL 10 G IV SOLR
1500.0000 mg | Freq: Two times a day (BID) | INTRAVENOUS | Status: DC
  Administered 2013-06-24 – 2013-06-28 (×8): 1500 mg via INTRAVENOUS
  Filled 2013-06-23 (×10): qty 1500

## 2013-06-23 MED ORDER — LOSARTAN POTASSIUM 50 MG PO TABS
50.0000 mg | ORAL_TABLET | Freq: Every morning | ORAL | Status: DC
  Administered 2013-06-24 – 2013-06-30 (×7): 50 mg via ORAL
  Filled 2013-06-23 (×8): qty 1

## 2013-06-23 MED ORDER — SIMVASTATIN 10 MG PO TABS
10.0000 mg | ORAL_TABLET | Freq: Every day | ORAL | Status: DC
  Administered 2013-06-24 – 2013-06-26 (×3): 10 mg via ORAL
  Filled 2013-06-23 (×4): qty 1

## 2013-06-23 MED ORDER — SODIUM CHLORIDE 0.9 % IV BOLUS (SEPSIS)
2000.0000 mL | Freq: Once | INTRAVENOUS | Status: AC
  Administered 2013-06-23: 2000 mL via INTRAVENOUS

## 2013-06-23 MED ORDER — ONDANSETRON HCL 4 MG PO TABS
4.0000 mg | ORAL_TABLET | Freq: Four times a day (QID) | ORAL | Status: DC | PRN
  Administered 2013-06-30: 4 mg via ORAL
  Filled 2013-06-23: qty 1

## 2013-06-23 MED ORDER — METOPROLOL SUCCINATE 12.5 MG HALF TABLET
12.5000 mg | ORAL_TABLET | Freq: Two times a day (BID) | ORAL | Status: DC
  Administered 2013-06-24 – 2013-06-27 (×8): 12.5 mg via ORAL
  Filled 2013-06-23 (×10): qty 1

## 2013-06-23 MED ORDER — SODIUM CHLORIDE 0.9 % IJ SOLN
3.0000 mL | Freq: Two times a day (BID) | INTRAMUSCULAR | Status: DC
  Administered 2013-06-24 – 2013-07-08 (×14): 3 mL via INTRAVENOUS

## 2013-06-23 NOTE — ED Notes (Signed)
[facility] EDP made aware of CG4-Lactic results. 

## 2013-06-23 NOTE — Progress Notes (Signed)
ANTIBIOTIC CONSULT NOTE - INITIAL  Pharmacy Consult for Vancomycin and Zosyn Indication: rule out sepsis  Allergies  Allergen Reactions  . Lisinopril Swelling    Tongue swelling    Patient Measurements: Height: 5\' 11"  (180.3 cm) Weight: 363 lb (164.656 kg) IBW/kg (Calculated) : 75.3 Adjusted Body Weight: 103 kg  Vital Signs: Temp: 101.9 F (38.8 C) (08/04 1828) Temp src: Oral (08/04 1828) BP: 116/81 mmHg (08/04 1828) Pulse Rate: 79 (08/04 1915) Intake/Output from previous day:   Intake/Output from this shift:    Labs:  Recent Labs  06/23/13 1848  WBC 22.9*  HGB 12.0*  PLT 192   Estimated Creatinine Clearance: 110.9 ml/min (by C-G formula based on Cr of 0.96). No results found for this basename: VANCOTROUGH, VANCOPEAK, VANCORANDOM, GENTTROUGH, GENTPEAK, GENTRANDOM, TOBRATROUGH, TOBRAPEAK, TOBRARND, AMIKACINPEAK, AMIKACINTROU, AMIKACIN,  in the last 72 hours   Microbiology: No results found for this or any previous visit (from the past 720 hour(s)).  Medical History: Past Medical History  Diagnosis Date  . Hypertension     benign essential hypertension  . Diabetes mellitus     NIDDM  . Morbid obesity   . Pure hypercholesterolemia   . Long-term (current) use of anticoagulants   . Varicose veins of legs   . Pulmonary hypertension MODERATE  . Cor pulmonale, chronic   . Chronic right-sided CHF (congestive heart failure)   . Atrial fibrillation CARDIOLOGIST-- DR Jacinto Halim  . Anxiety   . Shortness of breath     with exertion   . Arthritis     Medications:  Anti-infectives   Start     Dose/Rate Route Frequency Ordered Stop   06/24/13 1000  vancomycin (VANCOCIN) 1,500 mg in sodium chloride 0.9 % 500 mL IVPB     1,500 mg 250 mL/hr over 120 Minutes Intravenous Every 12 hours 06/23/13 1959     06/24/13 0600  piperacillin-tazobactam (ZOSYN) IVPB 3.375 g     3.375 g 12.5 mL/hr over 240 Minutes Intravenous 3 times per day 06/23/13 1949     06/23/13 2200   vancomycin (VANCOCIN) 2,000 mg in sodium chloride 0.9 % 500 mL IVPB     2,000 mg 250 mL/hr over 120 Minutes Intravenous  Once 06/23/13 1959     06/23/13 2000  piperacillin-tazobactam (ZOSYN) IVPB 3.375 g     3.375 g 100 mL/hr over 30 Minutes Intravenous  Once 06/23/13 1949       Assessment: 72yo obese M admitted from home with with fever, SOB(h/o CHF), and body aches. Pharmacy is asked to dose empiric abx for possible sepsis. SCr wnl, CrCl 19ml/min/1.73m2.  Goal of Therapy:  Vancomycin trough level 15-20 mcg/ml  Plan:   Vancomycin 2g loading dose then 1500mg  IV q12h.  Zosyn 3.375g IV Q8H infused over 4hrs.  Measure Vanc trough at steady state.  Follow up renal fxn and culture results.  Charolotte Eke, PharmD, pager [phone]. 06/23/2013,8:00 PM.

## 2013-06-23 NOTE — ED Notes (Signed)
Blood cultures collected above IV site per EDP. IV fluids cut off for ten minutes prior to collection.

## 2013-06-23 NOTE — H&P (Signed)
[facility] History and Physical  Edwin [name] ZOX:[id number] DOB: 17-Dec-1940 DOA: 06/23/2013  Referring physician: ER physician. PCP: Aura Dials, MD  Specialists: Dr. Jacinto Halim. Cardiologist.  Chief Complaint: Fever.  HPI: Edwin [name] is a 72 y.o. male history of CHF, diabetes mellitus type 2, hyperlipidemia, A. fib on Coumadin presented with complaints of having fever chills and feeling weak and short of breath. Patient has been having these symptoms for last 2 days. Denies any chest pain or productive cough. Last 2 days patient has had diarrhea 3-4 episodes. Denies any vomiting. In the ER patient was found to have right lower extremity erythema actually up to mid thigh. Patient was found to be febrile and lactic acid was found to be elevated with elevated leukocyte counts. Patient was initially given 2 L normal saline bolus and started on vancomycin and Zosyn for cellulitis. Patient admitted with for further management.  Review of Systems: As presented in the history of presenting illness, rest negative.  Past Medical History  Diagnosis Date  . Hypertension     benign essential hypertension  . Diabetes mellitus     NIDDM  . Morbid obesity   . Pure hypercholesterolemia   . Long-term (current) use of anticoagulants   . Varicose veins of legs   . Pulmonary hypertension MODERATE  . Cor pulmonale, chronic   . Chronic right-sided CHF (congestive heart failure)   . Atrial fibrillation CARDIOLOGIST-- DR Jacinto Halim  . Anxiety   . Shortness of breath     with exertion   . Arthritis    Past Surgical History  Procedure Laterality Date  . Cardiovascular stress test  07-19-2012  DR Jacinto Halim    PROMINENT DIAPHRAGMATIC ATTENUATION/ NORMAL LVEF/ LOW RISK STUDY  . Transthoracic echocardiogram  05-16-2012    LOW NORMAL LVEF/ MOD. RV/ MILD HYPOKINESIS/ MOD. PULMONARY HTN/ CHRONIC COR PUMONALE/ NO SIG. CHANGE FROM 12-01-2010  . Mouth surgery    . Benign mole removal from nose     . Knee  arthroscopy with medial menisectomy Left 04/23/2013    Procedure: LEFT KNEE ARTHROSCOPY WITH PARTIAL MEDIAL MENISECTOMY;  Surgeon: Drucilla Schmidt, MD;  Location: [facility];  Service: Orthopedics;  Laterality: Left;   Social History:  reports that he quit smoking about 29 years ago. His smoking use included Cigarettes. He smoked 1.00 pack per day. He has never used smokeless tobacco. He reports that  drinks alcohol. He reports that he does not use illicit drugs. Home. where does patient live-- Can do ADLs. Can patient participate in ADLs?  Allergies  Allergen Reactions  . Lisinopril Swelling    Tongue swelling    Family History  Problem Relation Age of Onset  . Cancer Paternal Grandfather   . Hypertension Paternal Grandfather   . Stroke Paternal Grandfather       Prior to Admission medications   Medication Sig Start Date End Date Taking? Authorizing Provider  furosemide (LASIX) 40 MG tablet Take 80 mg by mouth 2 (two) times daily.    Yes Historical Provider, MD  losartan (COZAAR) 50 MG tablet Take 50 mg by mouth every morning.    Yes Historical Provider, MD  metFORMIN (GLUCOPHAGE) 500 MG tablet Take 500 mg by mouth daily after supper.    Yes Historical Provider, MD  metoprolol succinate (TOPROL-XL) 25 MG 24 hr tablet Take 12.5 mg by mouth 2 (two) times daily.    Yes Historical Provider, MD  potassium chloride SA (K-DUR,KLOR-CON) 20 MEQ tablet Take 20 mEq by mouth every morning.  Yes Historical Provider, MD  pravastatin (PRAVACHOL) 20 MG tablet Take 20 mg by mouth daily after supper.    Yes Historical Provider, MD  spironolactone (ALDACTONE) 25 MG tablet Take 25 mg by mouth daily as needed (For fluid retention.).    Yes Historical Provider, MD  warfarin (COUMADIN) 5 MG tablet Take 2.5-5 mg by mouth 2 (two) times daily. Take 2.5 mg daily for tues, thurs,sat   And  5mg  sun, mon,wed,fri   Yes Historical Provider, MD  acetaminophen (TYLENOL) 160 MG/5ML liquid Take 15 mg/kg by mouth every 4  (four) hours as needed for fever.    Historical Provider, MD  acetaminophen (TYLENOL) 325 MG tablet Take 325 mg by mouth every 6 (six) hours as needed for pain.    Historical Provider, MD  HYDROcodone-acetaminophen (NORCO) 10-325 MG per tablet Take 1 tablet by mouth every 6 (six) hours as needed for pain. 04/23/13   Illene Labrador Aplington, MD  sildenafil (VIAGRA) 100 MG tablet Take 100 mg by mouth daily as needed for erectile dysfunction.     Historical Provider, MD   Physical Exam: Filed Vitals:   06/23/13 2000 06/23/13 2100 06/23/13 2250 06/23/13 2251  BP: 111/56 113/62 101/57 101/57  Pulse: 75 76 71 77  Temp:    100.4 F (38 C)  TempSrc:    Oral  Resp: 29 22 26 20   Height:      Weight:      SpO2: 95% 95% 98% 100%     General:  Well-developed well-nourished.  Eyes: Anicteric no pallor.  ENT: No discharge from the ears eyes nose or mouth.  Neck: No mass felt.  Cardiovascular: S1-S2 heard.  Respiratory: No rhonchi or crepitations.  Abdomen: Soft nontender bowel sounds present.  Skin: Erythema extending from the right lower extremity up to mid thigh. Chronic skin changes in the lower extremity.  Musculoskeletal: Bilateral lower extremity edema. Appears chronic.  Psychiatric: Appears normal.  Neurologic: Alert awake oriented to time place and person. Moves all extremities.  Labs on Admission:  Basic Metabolic Panel:  Recent Labs Lab 06/23/13 1848  NA 132*  K 3.7  CL 90*  CO2 26  GLUCOSE 270*  BUN 11  CREATININE 0.94  CALCIUM 8.8   Liver Function Tests:  Recent Labs Lab 06/23/13 1848  AST 47*  ALT 26  ALKPHOS 58  BILITOT 1.4*  PROT 7.4  ALBUMIN 3.1*   No results found for this basename: LIPASE, AMYLASE,  in the last 168 hours No results found for this basename: AMMONIA,  in the last 168 hours CBC:  Recent Labs Lab 06/23/13 1848  WBC 22.9*  NEUTROABS 21.3*  HGB 12.0*  HCT 36.6*  MCV 96.8  PLT 192   Cardiac Enzymes:  Recent Labs Lab  06/23/13 1848  TROPONINI <0.30    BNP (last 3 results)  Recent Labs  06/23/13 1832  PROBNP 2292.0*   CBG: No results found for this basename: GLUCAP,  in the last 168 hours  Radiological Exams on Admission: Dg Chest 2 View  06/23/2013   *RADIOLOGY REPORT*  Clinical Data: Shortness of breath, weakness, fever  CHEST - 2 VIEW  Comparison: 04/18/2013  Findings: Cardiomegaly evident with central vascular congestion. No superimposed pneumonia or edema.  No effusion or pneumothorax. Trachea is midline.  Mild thoracic degenerative change.  No significant interval change.  IMPRESSION: Stable cardiomegaly without CHF or pneumonia   Original Report Authenticated By: Judie Petit. Miles Costain, M.D.     Assessment/Plan Principal Problem:   Fever  Active Problems:   Cellulitis   CHF (congestive heart failure)   Atrial fibrillation   Diarrhea   HTN (hypertension)   1. SIRS - probable source is right lower extremity cellulitis. Patient also has had diarrhea so check for C. difficile. Continue with vancomycin and Zosyn. Patient did receive 2 L normal saline bolus. Since patient has CHF and is on large doses of diuretics we will hold off further fluids but at the same time hold off diuretics. Closely observe intake output and metabolic panel. Recheck lactic acid levels in a.m. Follow cultures. Check Dopplers to rule out DVT. 2. CHF - presently holding off diuretics due to #1. 3. Atrial fibrillation - rate controlled. Continue Coumadin per pharmacy. 4. Diabetes mellitus type 2 - closely follow CBGs with sliding-scale. 5. Hypertension - continue home medications except diuretics.    Code Status: Full code.  Family Communication: Patient's son at the bedside.  Disposition Plan: Admit to inpatient.    Vittoria Noreen N. [facility] Pager [phone].  If 7PM-7AM, please contact night-coverage [email or web address] Password [facility] 06/23/2013, 11:46 PM

## 2013-06-23 NOTE — ED Notes (Addendum)
Per EMS-pt c/o of fever (103.9 tymp), generalized body aches, chills, weakness that started yesterday. Hx CHF. Associated with diarrhea. Chronic ETOH

## 2013-06-23 NOTE — ED Notes (Signed)
Unable to given report.  Rn to return call.

## 2013-06-23 NOTE — ED Notes (Signed)
ZOX:WRUE<AV> Expected date:<BR> Expected time:<BR> Means of arrival:<BR> Comments:<BR> SOB-CHF

## 2013-06-23 NOTE — ED Provider Notes (Signed)
CSN: [id number]     Arrival date & time 06/23/13  1818 History     First MD Initiated Contact with Patient 06/23/13 1824     Chief Complaint  Patient presents with  . Fever  . Shortness of Breath  . Generalized Body Aches   (Consider location/radiation/quality/duration/timing/severity/associated sxs/prior Treatment) Patient is a 72 y.o. male presenting with fever and shortness of breath.  Fever Shortness of Breath Associated symptoms: fever    Pt with multiple medical problems brought to the ED via EMS from home. Reports last night he began to run a fever and feel more SOB. He has had some nausea today but no vomiting. Occasional diarrhea and increased urination but no dysuria or hematuria. He denies cough or CP. No recent URI symptoms. He has been in his normal state of health recently.   Past Medical History  Diagnosis Date  . Hypertension     benign essential hypertension  . Diabetes mellitus     NIDDM  . Morbid obesity   . Pure hypercholesterolemia   . Long-term (current) use of anticoagulants   . Varicose veins of legs   . Pulmonary hypertension MODERATE  . Cor pulmonale, chronic   . Chronic right-sided CHF (congestive heart failure)   . Atrial fibrillation CARDIOLOGIST-- DR Jacinto Halim  . Anxiety   . Shortness of breath     with exertion   . Arthritis    Past Surgical History  Procedure Laterality Date  . Cardiovascular stress test  07-19-2012  DR Jacinto Halim    PROMINENT DIAPHRAGMATIC ATTENUATION/ NORMAL LVEF/ LOW RISK STUDY  . Transthoracic echocardiogram  05-16-2012    LOW NORMAL LVEF/ MOD. RV/ MILD HYPOKINESIS/ MOD. PULMONARY HTN/ CHRONIC COR PUMONALE/ NO SIG. CHANGE FROM 12-01-2010  . Mouth surgery    . Benign mole removal from nose     . Knee arthroscopy with medial menisectomy Left 04/23/2013    Procedure: LEFT KNEE ARTHROSCOPY WITH PARTIAL MEDIAL MENISECTOMY;  Surgeon: Drucilla Schmidt, MD;  Location: [facility];  Service: Orthopedics;  Laterality: Left;   Family History   Problem Relation Age of Onset  . Cancer Paternal Grandfather   . Hypertension Paternal Grandfather   . Stroke Paternal Grandfather    History  Substance Use Topics  . Smoking status: Former Smoker -- 1.00 packs/day    Types: Cigarettes    Quit date: 06/27/1984  . Smokeless tobacco: Never Used  . Alcohol Use: Yes     Comment: 28 drinks of vodka per week     Review of Systems  Constitutional: Positive for fever.  Respiratory: Positive for shortness of breath.    All other systems reviewed and are negative except as noted in HPI.   Allergies  Lisinopril  Home Medications   Current Outpatient Rx  Name  Route  Sig  Dispense  Refill  . acetaminophen (TYLENOL) 325 MG tablet   Oral   Take 325 mg by mouth every 6 (six) hours as needed for pain.         Marland Kitchen enoxaparin (LOVENOX) 150 MG/ML injection   Subcutaneous   Inject 150 mg into the skin daily.         . furosemide (LASIX) 40 MG tablet   Oral   Take 80 mg by mouth 2 (two) times daily.          Marland Kitchen HYDROcodone-acetaminophen (NORCO) 10-325 MG per tablet   Oral   Take 1 tablet by mouth every 6 (six) hours as needed for pain.  30 tablet   0   . losartan (COZAAR) 50 MG tablet   Oral   Take 50 mg by mouth every morning.          . metFORMIN (GLUCOPHAGE) 500 MG tablet   Oral   Take 500 mg by mouth daily after supper.          . metoprolol succinate (TOPROL-XL) 25 MG 24 hr tablet   Oral   Take 12.5 mg by mouth 2 (two) times daily.          . potassium chloride SA (K-DUR,KLOR-CON) 20 MEQ tablet   Oral   Take 20 mEq by mouth every morning.          . pravastatin (PRAVACHOL) 20 MG tablet   Oral   Take 20 mg by mouth daily after supper.          . sildenafil (VIAGRA) 100 MG tablet   Oral   Take 100 mg by mouth daily as needed for erectile dysfunction.          Marland Kitchen spironolactone (ALDACTONE) 25 MG tablet   Oral   Take 25 mg by mouth daily as needed (For fluid retention.).          Marland Kitchen warfarin  (COUMADIN) 5 MG tablet   Oral   Take 2.5-5 mg by mouth daily after supper. He takes one tablet on Thursday and Sunday and half a tablet the rest of the week.          BP 116/81  Pulse 90  Temp(Src) 101.9 F (38.8 C) (Oral)  Resp 29  SpO2 95% Physical Exam  Nursing note and vitals reviewed. Constitutional: He is oriented to person, place, and time. He appears well-developed and well-nourished.  HENT:  Head: Normocephalic and atraumatic.  Eyes: EOM are normal. Pupils are equal, round, and reactive to light.  Neck: Normal range of motion. Neck supple.  Cardiovascular: Normal rate, normal heart sounds and intact distal pulses.   Pulmonary/Chest: Effort normal. He has no wheezes. He has rales (diffusely).  Abdominal: Bowel sounds are normal. He exhibits no distension. There is no tenderness.  Musculoskeletal: He exhibits edema (woody edema of bilateral LE, unchanged per patient). He exhibits no tenderness.  Neurological: He is alert and oriented to person, place, and time. He has normal strength. No cranial nerve deficit or sensory deficit.  Skin: Skin is warm and dry. No rash noted.  Psychiatric: He has a normal mood and affect.    ED Course   Procedures (including critical care time)  Labs Reviewed  CBC WITH DIFFERENTIAL - Abnormal; Notable for the following:    WBC 22.9 (*)    RBC 3.78 (*)    Hemoglobin 12.0 (*)    HCT 36.6 (*)    Neutrophils Relative % 93 (*)    Neutro Abs 21.3 (*)    Lymphocytes Relative 3 (*)    All other components within normal limits  URINALYSIS, ROUTINE W REFLEX MICROSCOPIC - Abnormal; Notable for the following:    Color, Urine AMBER (*)    Glucose, UA 250 (*)    Hgb urine dipstick TRACE (*)    All other components within normal limits  PROTIME-INR - Abnormal; Notable for the following:    Prothrombin Time 17.7 (*)    INR 1.50 (*)    All other components within normal limits  PRO B NATRIURETIC PEPTIDE - Abnormal; Notable for the following:     Pro B Natriuretic peptide (BNP) 2292.0 (*)  All other components within normal limits  COMPREHENSIVE METABOLIC PANEL - Abnormal; Notable for the following:    Sodium 132 (*)    Chloride 90 (*)    Glucose, Bld 270 (*)    Albumin 3.1 (*)    AST 47 (*)    Total Bilirubin 1.4 (*)    GFR calc non Af Amer 82 (*)    All other components within normal limits  COMPREHENSIVE METABOLIC PANEL - Abnormal; Notable for the following:    Sodium 131 (*)    Potassium 2.7 (*)    Chloride 93 (*)    Glucose, Bld 222 (*)    Calcium 8.0 (*)    Albumin 2.7 (*)    Total Bilirubin 1.3 (*)    GFR calc non Af Amer 84 (*)    All other components within normal limits  CBC WITH DIFFERENTIAL - Abnormal; Notable for the following:    WBC 18.7 (*)    RBC 3.51 (*)    Hemoglobin 11.4 (*)    HCT 34.2 (*)    Neutrophils Relative % 91 (*)    Neutro Abs 17.0 (*)    Lymphocytes Relative 4 (*)    All other components within normal limits  GLUCOSE, CAPILLARY - Abnormal; Notable for the following:    Glucose-Capillary 183 (*)    All other components within normal limits  GLUCOSE, CAPILLARY - Abnormal; Notable for the following:    Glucose-Capillary 198 (*)    All other components within normal limits  HEMOGLOBIN A1C - Abnormal; Notable for the following:    Hemoglobin A1C 7.9 (*)    Mean Plasma Glucose 180 (*)    All other components within normal limits  GLUCOSE, CAPILLARY - Abnormal; Notable for the following:    Glucose-Capillary 207 (*)    All other components within normal limits  CG4 I-STAT (LACTIC ACID) - Abnormal; Notable for the following:    Lactic Acid, Venous 4.96 (*)    All other components within normal limits  CLOSTRIDIUM DIFFICILE BY PCR  URINE CULTURE  CULTURE, BLOOD (ROUTINE X 2)  CULTURE, BLOOD (ROUTINE X 2)  TROPONIN I  APTT  ETHANOL  URINE MICROSCOPIC-ADD ON  LACTIC ACID, PLASMA  TROPONIN I  TSH  PROTIME-INR  BASIC METABOLIC PANEL  CBC   Dg Chest 2 View  06/23/2013    *RADIOLOGY REPORT*  Clinical Data: Shortness of breath, weakness, fever  CHEST - 2 VIEW  Comparison: 04/18/2013  Findings: Cardiomegaly evident with central vascular congestion. No superimposed pneumonia or edema.  No effusion or pneumothorax. Trachea is midline.  Mild thoracic degenerative change.  No significant interval change.  IMPRESSION: Stable cardiomegaly without CHF or pneumonia   Original Report Authenticated By: Judie Petit. Miles Costain, M.D.   Dg Chest Port 1 View  06/24/2013   *RADIOLOGY REPORT*  Clinical Data: Dyspnea and wheezing  PORTABLE CHEST - 1 VIEW  Comparison: 06/23/2013  Findings: The cardiac silhouette is mildly enlarged. No mediastinal or hilar masses are noted.  The lungs are clear.  No pleural effusion or pneumothorax.  The bony thorax is intact.  IMPRESSION: No acute cardiopulmonary disease.  No change from the previous day's study.   Original Report Authenticated By: Amie Portland, M.D.   1. Cellulitis   2. Severe sepsis   3. Fever   4. Atrial fibrillation   5. Diarrhea   6. Diabetes mellitus   7. CHF (congestive heart failure), chronic, diastolic     MDM  No definite source for fever on  labs and imaging. Has leukocytosis and lactic acidosis but no hypotension or tachycardia.  Pt given gentle hydration due to CHF history and reported hypoxia by EMS. He has some erythema on RLE which is new per family, this may be a newly developing cellulitis to account for his fever. Admit for Abx and further eval.   CRITICAL CARE Performed by: Pollyann Savoy. Total critical care time: 40 Critical care time was exclusive of separately billable procedures and treating other patients. Critical care was necessary to treat or prevent imminent or life-threatening deterioration. Critical care was time spent personally by me on the following activities: development of treatment plan with patient and/or surrogate as well as nursing, discussions with consultants, evaluation of patient's response to  treatment, examination of patient, obtaining history from patient or surrogate, ordering and performing treatments and interventions, ordering and review of laboratory studies, ordering and review of radiographic studies, pulse oximetry and re-evaluation of patient's condition.   Charles B. Bernette Mayers, MD 06/24/13 1940

## 2013-06-24 ENCOUNTER — Inpatient Hospital Stay (HOSPITAL_COMMUNITY): Payer: MEDICARE

## 2013-06-24 ENCOUNTER — Encounter (HOSPITAL_COMMUNITY): Payer: Self-pay

## 2013-06-24 DIAGNOSIS — E785 Hyperlipidemia, unspecified: Secondary | ICD-10-CM | POA: Diagnosis present

## 2013-06-24 DIAGNOSIS — E119 Type 2 diabetes mellitus without complications: Secondary | ICD-10-CM

## 2013-06-24 DIAGNOSIS — I5032 Chronic diastolic (congestive) heart failure: Secondary | ICD-10-CM

## 2013-06-24 DIAGNOSIS — R0602 Shortness of breath: Secondary | ICD-10-CM

## 2013-06-24 LAB — CBC WITH DIFFERENTIAL/PLATELET
Basophils Relative: 0 % (ref 0–1)
Eosinophils Absolute: 0 10*3/uL (ref 0.0–0.7)
Hemoglobin: 11.4 g/dL — ABNORMAL LOW (ref 13.0–17.0)
Lymphs Abs: 0.8 10*3/uL (ref 0.7–4.0)
Monocytes Relative: 5 % (ref 3–12)
Neutro Abs: 17 10*3/uL — ABNORMAL HIGH (ref 1.7–7.7)
Neutrophils Relative %: 91 % — ABNORMAL HIGH (ref 43–77)
Platelets: 170 10*3/uL (ref 150–400)
RBC: 3.51 MIL/uL — ABNORMAL LOW (ref 4.22–5.81)
WBC: 18.7 10*3/uL — ABNORMAL HIGH (ref 4.0–10.5)

## 2013-06-24 LAB — COMPREHENSIVE METABOLIC PANEL
BUN: 11 mg/dL (ref 6–23)
CO2: 29 mEq/L (ref 19–32)
Calcium: 8 mg/dL — ABNORMAL LOW (ref 8.4–10.5)
Chloride: 93 mEq/L — ABNORMAL LOW (ref 96–112)
Creatinine, Ser: 0.89 mg/dL (ref 0.50–1.35)
GFR calc Af Amer: >90 mL/min (ref >90)
GFR calc non Af Amer: 84 mL/min — ABNORMAL LOW (ref >90)
Glucose, Bld: 222 mg/dL — ABNORMAL HIGH (ref 70–99)
Total Bilirubin: 1.3 mg/dL — ABNORMAL HIGH (ref 0.3–1.2)

## 2013-06-24 LAB — LACTIC ACID, PLASMA: Lactic Acid, Venous: 1.8 mmol/L (ref 0.5–2.2)

## 2013-06-24 LAB — GLUCOSE, CAPILLARY: Glucose-Capillary: 180 mg/dL — ABNORMAL HIGH (ref 70–99)

## 2013-06-24 LAB — TROPONIN I: Troponin I: <0.3 ng/mL (ref <0.30)

## 2013-06-24 LAB — TSH: TSH: 0.933 u[IU]/mL (ref 0.350–4.500)

## 2013-06-24 MED ORDER — INSULIN ASPART 100 UNIT/ML ~~LOC~~ SOLN
0.0000 [IU] | Freq: Three times a day (TID) | SUBCUTANEOUS | Status: DC
Start: 2013-06-24 — End: 2013-07-08
  Administered 2013-06-24: 5 [IU] via SUBCUTANEOUS
  Administered 2013-06-24 – 2013-06-26 (×5): 3 [IU] via SUBCUTANEOUS
  Administered 2013-06-26: 12:00:00 via SUBCUTANEOUS
  Administered 2013-06-26: 3 [IU] via SUBCUTANEOUS
  Administered 2013-06-27 (×2): 5 [IU] via SUBCUTANEOUS
  Administered 2013-06-27 – 2013-06-28 (×2): 3 [IU] via SUBCUTANEOUS
  Administered 2013-06-28: 2 [IU] via SUBCUTANEOUS
  Administered 2013-06-28 – 2013-06-29 (×2): 3 [IU] via SUBCUTANEOUS
  Administered 2013-06-29 – 2013-06-30 (×4): 2 [IU] via SUBCUTANEOUS
  Administered 2013-06-30: 3 [IU] via SUBCUTANEOUS
  Administered 2013-07-01 (×3): 5 [IU] via SUBCUTANEOUS
  Administered 2013-07-02: 3 [IU] via SUBCUTANEOUS
  Administered 2013-07-02: 5 [IU] via SUBCUTANEOUS
  Administered 2013-07-02: 3 [IU] via SUBCUTANEOUS
  Administered 2013-07-03 – 2013-07-04 (×5): 2 [IU] via SUBCUTANEOUS
  Administered 2013-07-05 (×2): 3 [IU] via SUBCUTANEOUS
  Administered 2013-07-05: 2 [IU] via SUBCUTANEOUS
  Administered 2013-07-06: 3 [IU] via SUBCUTANEOUS
  Administered 2013-07-06: 2 [IU] via SUBCUTANEOUS
  Administered 2013-07-06: 3 [IU] via SUBCUTANEOUS
  Administered 2013-07-07: 2 [IU] via SUBCUTANEOUS
  Administered 2013-07-07: 3 [IU] via SUBCUTANEOUS
  Administered 2013-07-07 – 2013-07-08 (×2): 2 [IU] via SUBCUTANEOUS

## 2013-06-24 MED ORDER — WARFARIN - PHARMACIST DOSING INPATIENT: Freq: Every day | Status: DC

## 2013-06-24 MED ORDER — ALBUTEROL SULFATE (5 MG/ML) 0.5% IN NEBU
2.5000 mg | INHALATION_SOLUTION | RESPIRATORY_TRACT | Status: DC | PRN
  Administered 2013-06-24: 2.5 mg via RESPIRATORY_TRACT
  Filled 2013-06-24: qty 0.5

## 2013-06-24 MED ORDER — ZOLPIDEM TARTRATE 5 MG PO TABS
5.0000 mg | ORAL_TABLET | Freq: Once | ORAL | Status: AC
  Administered 2013-06-25: 5 mg via ORAL
  Filled 2013-06-24: qty 1

## 2013-06-24 MED ORDER — POTASSIUM CHLORIDE 10 MEQ/100ML IV SOLN
10.0000 meq | INTRAVENOUS | Status: AC
  Administered 2013-06-24 (×4): 10 meq via INTRAVENOUS
  Filled 2013-06-24 (×4): qty 100

## 2013-06-24 MED ORDER — ZOLPIDEM TARTRATE 5 MG PO TABS
5.0000 mg | ORAL_TABLET | Freq: Once | ORAL | Status: AC
  Administered 2013-06-24: 5 mg via ORAL
  Filled 2013-06-24: qty 1

## 2013-06-24 MED ORDER — POTASSIUM CHLORIDE CRYS ER 20 MEQ PO TBCR
60.0000 meq | EXTENDED_RELEASE_TABLET | Freq: Once | ORAL | Status: AC
  Administered 2013-06-24: 60 meq via ORAL
  Filled 2013-06-24: qty 3

## 2013-06-24 MED ORDER — FUROSEMIDE 80 MG PO TABS
80.0000 mg | ORAL_TABLET | Freq: Two times a day (BID) | ORAL | Status: DC
  Administered 2013-06-24 – 2013-06-25 (×3): 80 mg via ORAL
  Filled 2013-06-24 (×5): qty 1

## 2013-06-24 MED ORDER — WARFARIN SODIUM 5 MG PO TABS
5.0000 mg | ORAL_TABLET | Freq: Once | ORAL | Status: AC
  Administered 2013-06-24: 5 mg via ORAL
  Filled 2013-06-24: qty 1

## 2013-06-24 MED ORDER — WARFARIN SODIUM 2.5 MG PO TABS
2.5000 mg | ORAL_TABLET | Freq: Once | ORAL | Status: AC
  Administered 2013-06-24: 2.5 mg via ORAL
  Filled 2013-06-24: qty 1

## 2013-06-24 MED ORDER — POTASSIUM CHLORIDE CRYS ER 20 MEQ PO TBCR
40.0000 meq | EXTENDED_RELEASE_TABLET | Freq: Two times a day (BID) | ORAL | Status: DC
  Administered 2013-06-24: 40 meq via ORAL
  Filled 2013-06-24 (×3): qty 2

## 2013-06-24 NOTE — Progress Notes (Signed)
ANTICOAGULATION CONSULT NOTE - Initial Consult  Pharmacy Consult for Warfarin Indication: atrial fibrillation  Allergies  Allergen Reactions  . Lisinopril Swelling    Tongue swelling    Patient Measurements: Height: 5\' 11"  (180.3 cm) Weight: 359 lb 4.8 oz (162.977 kg) IBW/kg (Calculated) : 75.3   Vital Signs: Temp: 99.6 F (37.6 C) (08/05 0030) Temp src: Oral (08/05 0030) BP: 136/70 mmHg (08/05 0030) Pulse Rate: 79 (08/05 0030)  Labs:  Recent Labs  06/23/13 1848  HGB 12.0*  HCT 36.6*  PLT 192  APTT 34  LABPROT 17.7*  INR 1.50*  CREATININE 0.94  TROPONINI <0.30    Estimated Creatinine Clearance: 112.6 ml/min (by C-G formula based on Cr of 0.94).   Medical History: Past Medical History  Diagnosis Date  . Hypertension     benign essential hypertension  . Diabetes mellitus     NIDDM  . Morbid obesity   . Pure hypercholesterolemia   . Long-term (current) use of anticoagulants   . Varicose veins of legs   . Pulmonary hypertension MODERATE  . Cor pulmonale, chronic   . Chronic right-sided CHF (congestive heart failure)   . Atrial fibrillation CARDIOLOGIST-- DR Jacinto Halim  . Anxiety   . Shortness of breath     with exertion   . Arthritis     Medications:  Scheduled:  . insulin aspart  0-9 Units Subcutaneous TID WC  . losartan  50 mg Oral q morning - 10a  . metoprolol succinate  12.5 mg Oral BID  . piperacillin-tazobactam (ZOSYN)  IV  3.375 g Intravenous Q8H  . simvastatin  10 mg Oral q1800  . sodium chloride  3 mL Intravenous Q12H  . sodium chloride  3 mL Intravenous Q12H  . vancomycin  1,500 mg Intravenous Q12H  . warfarin  5 mg Oral Once  . Warfarin - Pharmacist Dosing Inpatient   Does not apply q1800   Infusions:    Assessment: 72 yo admitted with fever/probable sepsis on chronic coumadin for A-fib.   Goal of Therapy:  INR 2-3    Plan:   Warfarin 5mg  x1 this am.  Daily PT/INR  Education  Susanne Greenhouse R 06/24/2013,1:40 AM

## 2013-06-24 NOTE — Progress Notes (Signed)
Patient was not using the urinal to void.  Educated patient on importance of monitoring intake and output and encouraged patient to use the urinal when voiding.  Patient also audibly wheezing even when at rest with some dyspnea.  Dr. Jomarie Longs notified, stat CXR and nebulizer treatments ordered. Will continue to monitor.

## 2013-06-24 NOTE — Progress Notes (Addendum)
[facility] PROGRESS NOTE  Edwin [name] ZOX:[id number] DOB: October 21, 1941 DOA: 06/23/2013 PCP: Aura Dials, MD  Assessment/Plan: 1. SIRS/Right lower extremity cellulitis.  -suspect diarrhea is from SIRS -agree with checking Cdiff -Continue Vancomycin and Zosyn -received fluid bolus in ER x2L will stop IVF -FU blood Cx -dopplers negative for DVT  2. CHF - likely diastolic -clinically compensated but given 2-3plus lower extremity edema, will resume Po Lasix at home dose  3. Atrial fibrillation - rate controlled. Continue Coumadin per pharmacy.  4. Diabetes mellitus type 2 - closely follow CBGs with sliding-scale, hold metformin, check AIC  5. Hypertension - continue home medications  6.  Hypokalemia-replace       -bmet in am  7.   Morbid obesity  DVT proph: on coumadin   Code Status: FuLL Family Communication: none at bedside Disposition Plan:home in 2-3days if stable   HPI/Subjective: Feels ok, no further fevers Objective: Filed Vitals:   06/24/13 0441  BP: 140/57  Pulse: 76  Temp: 99.5 F (37.5 C)  Resp: 24    Intake/Output Summary (Last 24 hours) at 06/24/13 1150 Last data filed at 06/24/13 0747  Gross per 24 hour  Intake    320 ml  Output      0 ml  Net    320 ml   Filed Weights   06/23/13 1949 06/24/13 0030 06/24/13 0441  Weight: 164.656 kg (363 lb) 162.977 kg (359 lb 4.8 oz) 161.5 kg (356 lb 0.7 oz)    Exam:   General: AAOx3, morbidly obese  Cardiovascular: S1S2/RRR  Respiratory:CTAB  Abdomen: soft, Nt, BS present  Musculoskeletal: erythema and tenderness of RLE tracking up medical thigh  2plus edema with chronic venous stasis changes superimposed  Data Reviewed: Basic Metabolic Panel:  Recent Labs Lab 06/23/13 1848 06/24/13 0410  NA 132* 131*  K 3.7 2.7*  CL 90* 93*  CO2 26 29  GLUCOSE 270* 222*  BUN 11 11  CREATININE 0.94 0.89  CALCIUM 8.8 8.0*   Liver Function Tests:  Recent Labs Lab 06/23/13 1848 06/24/13 0410   AST 47* 32  ALT 26 21  ALKPHOS 58 46  BILITOT 1.4* 1.3*  PROT 7.4 6.2  ALBUMIN 3.1* 2.7*   No results found for this basename: LIPASE, AMYLASE,  in the last 168 hours No results found for this basename: AMMONIA,  in the last 168 hours CBC:  Recent Labs Lab 06/23/13 1848 06/24/13 0410  WBC 22.9* 18.7*  NEUTROABS 21.3* 17.0*  HGB 12.0* 11.4*  HCT 36.6* 34.2*  MCV 96.8 97.4  PLT 192 170   Cardiac Enzymes:  Recent Labs Lab 06/23/13 1848 06/24/13 0410  TROPONINI <0.30 <0.30   BNP (last 3 results)  Recent Labs  06/23/13 1832  PROBNP 2292.0*   CBG:  Recent Labs Lab 06/24/13 0656 06/24/13 1139  GLUCAP 183* 198*    Recent Results (from the past 240 hour(s))  CLOSTRIDIUM DIFFICILE BY PCR     Status: None   Collection Time    06/24/13  1:44 AM      Result Value Range Status   C difficile by pcr NEGATIVE  NEGATIVE Final   Comment: Performed at [facility]     Studies: Dg Chest 2 View  06/23/2013   *RADIOLOGY REPORT*  Clinical Data: Shortness of breath, weakness, fever  CHEST - 2 VIEW  Comparison: 04/18/2013  Findings: Cardiomegaly evident with central vascular congestion. No superimposed pneumonia or edema.  No effusion or pneumothorax. Trachea is midline.  Mild thoracic  degenerative change.  No significant interval change.  IMPRESSION: Stable cardiomegaly without CHF or pneumonia   Original Report Authenticated By: Judie Petit. Shick, M.D.    Scheduled Meds: . furosemide  80 mg Oral BID  . insulin aspart  0-9 Units Subcutaneous TID WC  . losartan  50 mg Oral q morning - 10a  . metoprolol succinate  12.5 mg Oral BID  . piperacillin-tazobactam (ZOSYN)  IV  3.375 g Intravenous Q8H  . potassium chloride  10 mEq Intravenous Q1 Hr x 4  . potassium chloride  40 mEq Oral BID  . simvastatin  10 mg Oral q1800  . sodium chloride  3 mL Intravenous Q12H  . sodium chloride  3 mL Intravenous Q12H  . vancomycin  1,500 mg Intravenous Q12H  . warfarin  2.5 mg Oral ONCE-1800   . Warfarin - Pharmacist Dosing Inpatient   Does not apply q1800   Continuous Infusions:   Principal Problem:   Fever Active Problems:   Cellulitis   CHF (congestive heart failure)   Atrial fibrillation   Diarrhea   HTN (hypertension)   Diabetes mellitus   Hyperlipidemia    Time spent:    [facility]  [facility] Pager [phone]. If 7PM-7AM, please contact night-coverage at [email or web address], password [facility] 06/24/2013, 11:50 AM  LOS: 1 day

## 2013-06-24 NOTE — Progress Notes (Signed)
critiCRITICAL VALUE ALERT  Critical value received: K 2.7  Date of notification:  06/24/13  Time of notification:  0530  Critical value read back:yes  Nurse who received alert:  Joya Gaskins RN  MD notified (1st page):  K Schorr  Time of first page:  0532  MD notified (2nd page):  Time of second page:  Responding MD: KSchorr  Time MD responded:  [phone]

## 2013-06-24 NOTE — Progress Notes (Signed)
Inpatient Diabetes Program Recommendations  AACE/ADA: New Consensus Statement on Inpatient Glycemic Control (2013)  Target Ranges:  Prepandial:   less than 140 mg/dL      Peak postprandial:   less than 180 mg/dL (1-2 hours)      Critically ill patients:  140 - 180 mg/dL   Reason for Visit: Hyperglycemia  Results for TIEN, SPOONER (MRN [id number]) as of 06/24/2013 10:27  Ref. Range 06/23/2013 18:48 06/23/2013 18:56 06/24/2013 04:10  Sodium Latest Range: 135-145 mEq/L 132 (L)  131 (L)  Potassium Latest Range: 3.5-5.1 mEq/L 3.7  2.7 (LL)  Chloride Latest Range: 96-112 mEq/L 90 (L)  93 (L)  CO2 Latest Range: 19-32 mEq/L 26  29  BUN Latest Range: 6-23 mg/dL 11  11  Creatinine Latest Range: 0.50-1.35 mg/dL 5.78  4.69  Calcium Latest Range: 8.4-10.5 mg/dL 8.8  8.0 (L)  GFR calc non Af Amer Latest Range: >90 mL/min 82 (L)  84 (L)  GFR calc Af Amer Latest Range: >90 mL/min >90  >90  Glucose Latest Range: 70-99 mg/dL 629 (H)  528 (H)    Inpatient Diabetes Program Recommendations Correction (SSI): Increase Novolog to moderate tidwc and hs HgbA1C: Check HgbA1C to assess glycemic control prior to hospitalization  Note: Will follow.  Thank you. Ailene Ards, RD, LDN, CDE Inpatient Diabetes Coordinator [phone]

## 2013-06-24 NOTE — Progress Notes (Signed)
ANTICOAGULATION CONSULT NOTE - Follow up   Pharmacy Consult for Warfarin Indication: atrial fibrillation  Allergies  Allergen Reactions  . Lisinopril Swelling    Tongue swelling    Patient Measurements: Height: 5\' 11"  (180.3 cm) Weight: 356 lb 0.7 oz (161.5 kg) IBW/kg (Calculated) : 75.3   Vital Signs: Temp: 99.5 F (37.5 C) (08/05 0441) Temp src: Oral (08/05 0441) BP: 140/57 mmHg (08/05 0441) Pulse Rate: 76 (08/05 0441)  Labs:  Recent Labs  06/23/13 1848 06/24/13 0410  HGB 12.0* 11.4*  HCT 36.6* 34.2*  PLT 192 170  APTT 34  --   LABPROT 17.7*  --   INR 1.50*  --   CREATININE 0.94 0.89  TROPONINI <0.30 <0.30    Estimated Creatinine Clearance: 118.2 ml/min (by C-G formula based on Cr of 0.89).  Medications:  Scheduled:  . insulin aspart  0-9 Units Subcutaneous TID WC  . losartan  50 mg Oral q morning - 10a  . metoprolol succinate  12.5 mg Oral BID  . piperacillin-tazobactam (ZOSYN)  IV  3.375 g Intravenous Q8H  . potassium chloride  10 mEq Intravenous Q1 Hr x 4  . potassium chloride  60 mEq Oral Once  . simvastatin  10 mg Oral q1800  . sodium chloride  3 mL Intravenous Q12H  . sodium chloride  3 mL Intravenous Q12H  . vancomycin  1,500 mg Intravenous Q12H  . Warfarin - Pharmacist Dosing Inpatient   Does not apply q1800   Infusions:    Assessment: 72 yo admitted with fever/probable sepsis on chronic warfarin for A-fib.  Pharmacy asked to dose warfarin inpatient.  INR subtherapeutic at 1.5 on admission 8/4  Home warfarin dose 2.5mg  Tues/Thur/Sat and 5mg  Sun/Mon/Wed/Fri  Hgb 11.4, Plt 170  Pt given first inpatient warfarin dose ~0600 on 8/5 (missed dose on 8/4), next dose will be given on 8/5 pm.  Goal of Therapy:  INR 2-3  Plan:   Warfarin 2.5mg  PO tonight at 1800  Daily INR  Lynann Beaver PharmD, BCPS Pager [phone] 06/24/2013 7:54 AM

## 2013-06-24 NOTE — Progress Notes (Signed)
Bilateral lower extremity venous duplex:  No evidence of DVT, superficial thrombosis, or Baker's Cyst.   

## 2013-06-25 ENCOUNTER — Inpatient Hospital Stay (HOSPITAL_COMMUNITY): Payer: MEDICARE

## 2013-06-25 ENCOUNTER — Other Ambulatory Visit: Payer: Self-pay

## 2013-06-25 LAB — BASIC METABOLIC PANEL
BUN: 9 mg/dL (ref 6–23)
Chloride: 93 mEq/L — ABNORMAL LOW (ref 96–112)
GFR calc Af Amer: >90 mL/min (ref >90)
Potassium: 3 mEq/L — ABNORMAL LOW (ref 3.5–5.1)

## 2013-06-25 LAB — URINE CULTURE
Colony Count: NO GROWTH
Culture: NO GROWTH

## 2013-06-25 LAB — GLUCOSE, CAPILLARY
Glucose-Capillary: 188 mg/dL — ABNORMAL HIGH (ref 70–99)
Glucose-Capillary: 184 mg/dL — ABNORMAL HIGH (ref 70–99)

## 2013-06-25 LAB — PROTIME-INR: Prothrombin Time: 17.2 seconds — ABNORMAL HIGH (ref 11.6–15.2)

## 2013-06-25 LAB — CBC
HCT: 34.8 % — ABNORMAL LOW (ref 39.0–52.0)
Hemoglobin: 11.8 g/dL — ABNORMAL LOW (ref 13.0–17.0)
MCHC: 33.9 g/dL (ref 30.0–36.0)
RBC: 3.63 MIL/uL — ABNORMAL LOW (ref 4.22–5.81)

## 2013-06-25 MED ORDER — WARFARIN SODIUM 10 MG PO TABS
10.0000 mg | ORAL_TABLET | Freq: Once | ORAL | Status: AC
  Administered 2013-06-25: 10 mg via ORAL
  Filled 2013-06-25: qty 1

## 2013-06-25 MED ORDER — MORPHINE SULFATE 4 MG/ML IJ SOLN
4.0000 mg | Freq: Once | INTRAMUSCULAR | Status: AC
  Administered 2013-06-26: 4 mg via INTRAVENOUS
  Filled 2013-06-25: qty 1

## 2013-06-25 MED ORDER — FUROSEMIDE 10 MG/ML IJ SOLN
80.0000 mg | Freq: Two times a day (BID) | INTRAMUSCULAR | Status: DC
  Administered 2013-06-25 – 2013-06-30 (×11): 80 mg via INTRAVENOUS
  Filled 2013-06-25 (×16): qty 8

## 2013-06-25 MED ORDER — ENOXAPARIN SODIUM 150 MG/ML ~~LOC~~ SOLN
165.0000 mg | Freq: Two times a day (BID) | SUBCUTANEOUS | Status: DC
  Administered 2013-06-25 – 2013-06-28 (×7): 165 mg via SUBCUTANEOUS
  Filled 2013-06-25 (×9): qty 2

## 2013-06-25 MED ORDER — POTASSIUM CHLORIDE CRYS ER 20 MEQ PO TBCR
40.0000 meq | EXTENDED_RELEASE_TABLET | Freq: Three times a day (TID) | ORAL | Status: DC
  Administered 2013-06-25 – 2013-06-27 (×7): 40 meq via ORAL
  Filled 2013-06-25 (×9): qty 2

## 2013-06-25 MED ORDER — ZOLPIDEM TARTRATE 5 MG PO TABS
5.0000 mg | ORAL_TABLET | Freq: Every evening | ORAL | Status: DC | PRN
  Administered 2013-06-26 – 2013-07-07 (×5): 5 mg via ORAL
  Filled 2013-06-25 (×5): qty 1

## 2013-06-25 MED ORDER — SIMETHICONE 80 MG PO CHEW
80.0000 mg | CHEWABLE_TABLET | Freq: Four times a day (QID) | ORAL | Status: AC | PRN
  Administered 2013-06-25 – 2013-06-26 (×2): 80 mg via ORAL
  Filled 2013-06-25 (×2): qty 1

## 2013-06-25 NOTE — Progress Notes (Signed)
ANTICOAGULATION CONSULT NOTE  Pharmacy Consult for Lovenox, Warfarin Indication: atrial fibrillation  Allergies  Allergen Reactions  . Lisinopril Swelling    Tongue swelling    Patient Measurements: Height: 5\' 11"  (180.3 cm) Weight: 361 lb 15.9 oz (164.2 kg) IBW/kg (Calculated) : 75.3  Vital Signs: Temp: 99.5 F (37.5 C) (08/06 0505) Temp src: Oral (08/06 0505) BP: 152/78 mmHg (08/06 0505) Pulse Rate: 77 (08/06 0505)  Labs:  Recent Labs  06/23/13 1848 06/24/13 0410 06/25/13 0340  HGB 12.0* 11.4* 11.8*  HCT 36.6* 34.2* 34.8*  PLT 192 170 147*  APTT 34  --   --   LABPROT 17.7*  --  17.2*  INR 1.50*  --  1.44  CREATININE 0.94 0.89 0.90  TROPONINI <0.30 <0.30  --     Estimated Creatinine Clearance: 118.1 ml/min (by C-G formula based on Cr of 0.9).  Medications:  Scheduled:  . furosemide  80 mg Intravenous BID  . insulin aspart  0-15 Units Subcutaneous TID WC  . losartan  50 mg Oral q morning - 10a  . metoprolol succinate  12.5 mg Oral BID  . piperacillin-tazobactam (ZOSYN)  IV  3.375 g Intravenous Q8H  . potassium chloride  40 mEq Oral TID  . simvastatin  10 mg Oral q1800  . sodium chloride  3 mL Intravenous Q12H  . sodium chloride  3 mL Intravenous Q12H  . vancomycin  1,500 mg Intravenous Q12H  . Warfarin - Pharmacist Dosing Inpatient   Does not apply q1800   Infusions:    Assessment: 72 yo admitted with fever/probable sepsis on chronic warfarin for A-fib. Pharmacy asked to dose warfarin inpatient.  Pharmacy asked to add Lovenox bridge on 8/6 d/t subtherapeutic INR and active Afib. INR subtherapeutic at 1.44, despite boosted dose 8/5. Hgb 11.8, Plt decreased to147 SCr stable, CrCl > 100 ml/min   Goal of Therapy:  INR 2-3 Anti-Xa level 0.6-1.2 units/ml 4hrs after LMWH dose given Monitor platelets by anticoagulation protocol: Yes   Plan:   Lovenox 165mg  SQ BID  Warfarin 10mg  PO today at 1200.  Daily INR, CBC  Follow up Anti-Xa level as  needed.  Lynann Beaver PharmD, BCPS Pager [phone] 06/25/2013 10:02 AM

## 2013-06-25 NOTE — Progress Notes (Signed)
*  PRELIMINARY RESULTS* Echocardiogram 2D Echocardiogram has been performed.  Jeryl Columbia 06/25/2013, 1:10 PM

## 2013-06-25 NOTE — Progress Notes (Signed)
Patient complaining of abdominal distention and states he 'feels bloated all of a sudden'.  Patient's upper stomach is slightly distended. Paged Dr. Jomarie Longs twice with no response as of yet.  Patient requesting medication for gas.  Patient currently in bed for 2D Echo.  Will continue to monitor.

## 2013-06-25 NOTE — Progress Notes (Signed)
[facility] PROGRESS NOTE  Edwin [name] ZOX:[id number] DOB: 1941/05/23 DOA: 06/23/2013 PCP: Aura Dials, MD  Assessment/Plan: 1. SIRS/Right lower extremity cellulitis.  -suspect diarrhea is from SIRS, improving -Cdiff PCR negative -Continue Vancomycin and Zosyn -stop IVF  -FU blood Cx -dopplers negative for DVT  2. CHF - acute on chronic likely diastolic - IV lasix 80mg  q12, add KCl - check 2D ECHO, unable to get acurate I/Os  3. Atrial fibrillation - rate controlled. Continue Coumadin per pharmacy.  4. Diabetes mellitus type 2 - closely follow CBGs with sliding-scale, hold metformin,  AIC 7.9  5. Hypertension - continue home medications  6.  Hypokalemia-replace       -bmet in am  7.   Morbid obesity  DVT proph: on coumadin   Code Status: FuLL Family Communication: none at bedside Disposition Plan:home in 2-3days if stable   HPI/Subjective: Feels ok, no further fevers, continues to have leg swelling Objective: Filed Vitals:   06/25/13 0505  BP: 152/78  Pulse: 77  Temp: 99.5 F (37.5 C)  Resp: 22    Intake/Output Summary (Last 24 hours) at 06/25/13 1248 Last data filed at 06/25/13 1100  Gross per 24 hour  Intake   1680 ml  Output    400 ml  Net   1280 ml   Filed Weights   06/24/13 0030 06/24/13 0441 06/25/13 0505  Weight: 162.977 kg (359 lb 4.8 oz) 161.5 kg (356 lb 0.7 oz) 164.2 kg (361 lb 15.9 oz)    Exam:   General: AAOx3, morbidly obese  Cardiovascular: S1S2/RRR  Respiratory:CTAB  Abdomen: soft, Nt, BS present  Musculoskeletal: erythema and tenderness of RLE tracking up medical thigh  2plus edema with chronic venous stasis changes superimposed  Data Reviewed: Basic Metabolic Panel:  Recent Labs Lab 06/23/13 1848 06/24/13 0410 06/25/13 0340  NA 132* 131* 129*  K 3.7 2.7* 3.0*  CL 90* 93* 93*  CO2 26 29 27   GLUCOSE 270* 222* 198*  BUN 11 11 9   CREATININE 0.94 0.89 0.90  CALCIUM 8.8 8.0* 8.0*   Liver Function  Tests:  Recent Labs Lab 06/23/13 1848 06/24/13 0410  AST 47* 32  ALT 26 21  ALKPHOS 58 46  BILITOT 1.4* 1.3*  PROT 7.4 6.2  ALBUMIN 3.1* 2.7*   No results found for this basename: LIPASE, AMYLASE,  in the last 168 hours No results found for this basename: AMMONIA,  in the last 168 hours CBC:  Recent Labs Lab 06/23/13 1848 06/24/13 0410 06/25/13 0340  WBC 22.9* 18.7* 11.1*  NEUTROABS 21.3* 17.0*  --   HGB 12.0* 11.4* 11.8*  HCT 36.6* 34.2* 34.8*  MCV 96.8 97.4 95.9  PLT 192 170 147*   Cardiac Enzymes:  Recent Labs Lab 06/23/13 1848 06/24/13 0410  TROPONINI <0.30 <0.30   BNP (last 3 results)  Recent Labs  06/23/13 1832  PROBNP 2292.0*   CBG:  Recent Labs Lab 06/24/13 1139 06/24/13 1708 06/24/13 2126 06/25/13 0752 06/25/13 1231  GLUCAP 198* 207* 180* 188* 192*    Recent Results (from the past 240 hour(s))  CULTURE, BLOOD (ROUTINE X 2)     Status: None   Collection Time    06/23/13  8:45 PM      Result Value Range Status   Specimen Description BLOOD RIGHT ARM   Final   Special Requests BOTTLES DRAWN AEROBIC AND ANAEROBIC 5CC   Final   Culture  Setup Time     Final   Value: 06/24/2013 02:51  Performed at [facility]     Final   Value:        BLOOD CULTURE RECEIVED NO GROWTH TO DATE CULTURE WILL BE HELD FOR 5 DAYS BEFORE ISSUING A FINAL NEGATIVE REPORT     Performed at [facility]   Report Status PENDING   Incomplete  URINE CULTURE     Status: None   Collection Time    06/23/13  8:51 PM      Result Value Range Status   Specimen Description URINE, CLEAN CATCH   Final   Special Requests NONE   Final   Culture  Setup Time     Final   Value: 06/24/2013 03:32     Performed at [facility] Count     Final   Value: NO GROWTH     Performed at [facility]   Culture     Final   Value: NO GROWTH     Performed at [facility]   Report Status 06/25/2013 FINAL   Final  CLOSTRIDIUM  DIFFICILE BY PCR     Status: None   Collection Time    06/24/13  1:44 AM      Result Value Range Status   C difficile by pcr NEGATIVE  NEGATIVE Final   Comment: Performed at [facility]     Studies: Dg Chest 2 View  06/23/2013   *RADIOLOGY REPORT*  Clinical Data: Shortness of breath, weakness, fever  CHEST - 2 VIEW  Comparison: 04/18/2013  Findings: Cardiomegaly evident with central vascular congestion. No superimposed pneumonia or edema.  No effusion or pneumothorax. Trachea is midline.  Mild thoracic degenerative change.  No significant interval change.  IMPRESSION: Stable cardiomegaly without CHF or pneumonia   Original Report Authenticated By: Judie Petit. Miles Costain, M.D.   Dg Chest Port 1 View  06/24/2013   *RADIOLOGY REPORT*  Clinical Data: Dyspnea and wheezing  PORTABLE CHEST - 1 VIEW  Comparison: 06/23/2013  Findings: The cardiac silhouette is mildly enlarged. No mediastinal or hilar masses are noted.  The lungs are clear.  No pleural effusion or pneumothorax.  The bony thorax is intact.  IMPRESSION: No acute cardiopulmonary disease.  No change from the previous day's study.   Original Report Authenticated By: Amie Portland, M.D.    Scheduled Meds: . enoxaparin (LOVENOX) injection  165 mg Subcutaneous BID  . furosemide  80 mg Intravenous BID  . insulin aspart  0-15 Units Subcutaneous TID WC  . losartan  50 mg Oral q morning - 10a  . metoprolol succinate  12.5 mg Oral BID  . piperacillin-tazobactam (ZOSYN)  IV  3.375 g Intravenous Q8H  . potassium chloride  40 mEq Oral TID  . simvastatin  10 mg Oral q1800  . sodium chloride  3 mL Intravenous Q12H  . sodium chloride  3 mL Intravenous Q12H  . vancomycin  1,500 mg Intravenous Q12H  . warfarin  10 mg Oral Once  . Warfarin - Pharmacist Dosing Inpatient   Does not apply q1800   Continuous Infusions:   Principal Problem:   Fever Active Problems:   Cellulitis   CHF (congestive heart failure)   Atrial fibrillation   Diarrhea   HTN  (hypertension)   Diabetes mellitus   Hyperlipidemia    Time spent:    [facility]  [facility] Pager [phone]. If 7PM-7AM, please contact night-coverage at [email or web address], password [facility] 06/25/2013, 12:48 PM  LOS: 2 days

## 2013-06-26 DIAGNOSIS — R6520 Severe sepsis without septic shock: Secondary | ICD-10-CM

## 2013-06-26 DIAGNOSIS — I509 Heart failure, unspecified: Secondary | ICD-10-CM

## 2013-06-26 DIAGNOSIS — I5033 Acute on chronic diastolic (congestive) heart failure: Secondary | ICD-10-CM

## 2013-06-26 DIAGNOSIS — A419 Sepsis, unspecified organism: Principal | ICD-10-CM

## 2013-06-26 LAB — GLUCOSE, CAPILLARY
Glucose-Capillary: 195 mg/dL — ABNORMAL HIGH (ref 70–99)
Glucose-Capillary: 201 mg/dL — ABNORMAL HIGH (ref 70–99)
Glucose-Capillary: 187 mg/dL — ABNORMAL HIGH (ref 70–99)

## 2013-06-26 LAB — BASIC METABOLIC PANEL
BUN: 12 mg/dL (ref 6–23)
Calcium: 8.3 mg/dL — ABNORMAL LOW (ref 8.4–10.5)
GFR calc non Af Amer: 69 mL/min — ABNORMAL LOW (ref >90)
Glucose, Bld: 209 mg/dL — ABNORMAL HIGH (ref 70–99)
Potassium: 2.9 mEq/L — ABNORMAL LOW (ref 3.5–5.1)

## 2013-06-26 LAB — CBC
Hemoglobin: 12.8 g/dL — ABNORMAL LOW (ref 13.0–17.0)
MCH: 31.8 pg (ref 26.0–34.0)
MCHC: 33.3 g/dL (ref 30.0–36.0)

## 2013-06-26 LAB — VANCOMYCIN, TROUGH: Vancomycin Tr: 14.8 ug/mL (ref 10.0–20.0)

## 2013-06-26 MED ORDER — ALUM & MAG HYDROXIDE-SIMETH 200-200-20 MG/5ML PO SUSP
15.0000 mL | ORAL | Status: DC | PRN
  Administered 2013-06-26 – 2013-07-07 (×7): 15 mL via ORAL
  Filled 2013-06-26 (×7): qty 30

## 2013-06-26 MED ORDER — PANTOPRAZOLE SODIUM 40 MG IV SOLR
40.0000 mg | INTRAVENOUS | Status: DC
  Administered 2013-06-26 – 2013-07-05 (×10): 40 mg via INTRAVENOUS
  Filled 2013-06-26 (×11): qty 40

## 2013-06-26 MED ORDER — WARFARIN SODIUM 10 MG PO TABS
10.0000 mg | ORAL_TABLET | Freq: Once | ORAL | Status: AC
  Administered 2013-06-26: 10 mg via ORAL
  Filled 2013-06-26: qty 1

## 2013-06-26 MED ORDER — POLYETHYLENE GLYCOL 3350 17 G PO PACK
17.0000 g | PACK | Freq: Two times a day (BID) | ORAL | Status: DC
  Administered 2013-06-26 – 2013-06-27 (×2): 17 g via ORAL
  Filled 2013-06-26 (×4): qty 1

## 2013-06-26 MED ORDER — POTASSIUM CHLORIDE 10 MEQ/100ML IV SOLN
10.0000 meq | INTRAVENOUS | Status: AC
  Administered 2013-06-26 (×5): 10 meq via INTRAVENOUS
  Filled 2013-06-26 (×5): qty 100

## 2013-06-26 NOTE — Progress Notes (Signed)
Pt began to complain of increasing SOB/difficulty breathing from the tightness and pressure in his lower abdomen.  Pt taking short shallow breaths. O2 sats in mid 90's on 2L [place].  Some expiratory wheezes on auscultation of lungs.  Pt c/o pain in abdomen 7 out of 10.  Pt's sons mostly worried about pt's breathing and pain.  This RN notified NP on call, K. Schorr.  She instructed pt to avoid a lot of carbonated drinks and food- bowel rest.  Also received orders for one time Morphine dose.  Will continue to monitor pt throughout the night.

## 2013-06-26 NOTE — Progress Notes (Signed)
Pt sleeping at this time and states that pain in abdomen has improved.  Breathing less labored and shallow.  Pt's O2 sats remain in the mid to high 90's. Will continue to monitor pt.

## 2013-06-26 NOTE — Progress Notes (Signed)
Pt's BM have been watery. Pt has had 4 today.

## 2013-06-26 NOTE — Progress Notes (Addendum)
[facility] PROGRESS NOTE  Edwin [name] UJW:[id number] DOB: 1941/08/24 DOA: 06/23/2013 PCP: Aura Dials, MD Brief narrative Chief Complaint: Fever.  HPI: Edwin [name] is a 72 y.o. male history of CHF, diabetes mellitus type 2, hyperlipidemia, A. fib on Coumadin presented with complaints of having fever chills and feeling weak. In the ER patient was found to have right lower extremity erythema actually up to mid thigh. Patient was found to be febrile and lactic acid was found to be elevated with elevated leukocyte counts. Patient was initially given 2 L normal saline bolus and started on vancomycin and Zosyn for cellulitis. His R leg cellulitis has much improved, subsequently has been having issues with volume overload, currently being diuresed and today with ileus.  Assessment/Plan: 1. SIRS/Right lower extremity cellulitis.  -significant improvement now -Continue Vancomycin Day3, DC zosyn -could change to PO Abx by tomorrow -blood Cx NGTD -dopplers negative for DVT  2. CHF - acute on chronic likely diastolic - continue IV lasix 80mg  q12, add KCl - 2D ECHO done and pending - unable to get acurate I/Os  3. Atrial fibrillation - rate controlled. Continue Coumadin per pharmacy, INR sub-therapeutic, being bridged with lovenox per pharmacy  4. Diabetes mellitus type 2 - closely follow CBGs with sliding-scale, hold metformin,  AIC 7.9  5. Hypertension - continue home medications  6.  Hypokalemia-replace       -bmet in am  7.   Morbid obesity  8. Ileus: abd distension/bloating from last pm     -asymptomatic with no N/V or pain today     -likely from narcotics, hypokalemia     -last BM yesterday     -add miralax BID, replace K IV and PO     -repeat KUB in am     -had diarrhea 2 days ago, resolved, Cdiff negative     -if worsens will need to downgrade diet, NGT etc  DVT proph: on coumadin/lovenox  Code Status: FuLL Family Communication: none at bedside Disposition Plan:home  in 2-3days if stable   HPI/Subjective: Feels ok, belly feels ok, no vomiting, tolerating diet, was bloated and distended last pm,  no further fevers, continues to have leg swelling Objective: Filed Vitals:   06/26/13 0525  BP: 120/77  Pulse: 69  Temp: 97.6 F (36.4 C)  Resp: 20    Intake/Output Summary (Last 24 hours) at 06/26/13 1124 Last data filed at 06/26/13 0900  Gross per 24 hour  Intake   2540 ml  Output    100 ml  Net   2440 ml   Filed Weights   06/24/13 0441 06/25/13 0505 06/26/13 0525  Weight: 161.5 kg (356 lb 0.7 oz) 164.2 kg (361 lb 15.9 oz) 162.7 kg (358 lb 11 oz)    Exam:   General: AAOx3, morbidly obese  Cardiovascular: S1S2/RRR  Respiratory:CTAB  Abdomen: soft, Nt, BS present  Musculoskeletal: erythema and tenderness of RLE tracking up medical thigh  2plus edema with chronic venous stasis changes superimposed  Data Reviewed: Basic Metabolic Panel:  Recent Labs Lab 06/23/13 1848 06/24/13 0410 06/25/13 0340 06/26/13 0433  NA 132* 131* 129* 131*  K 3.7 2.7* 3.0* 2.9*  CL 90* 93* 93* 94*  CO2 26 29 27 26   GLUCOSE 270* 222* 198* 209*  BUN 11 11 9 12   CREATININE 0.94 0.89 0.90 1.05  CALCIUM 8.8 8.0* 8.0* 8.3*   Liver Function Tests:  Recent Labs Lab 06/23/13 1848 06/24/13 0410  AST 47* 32  ALT 26 21  ALKPHOS 58  46  BILITOT 1.4* 1.3*  PROT 7.4 6.2  ALBUMIN 3.1* 2.7*   No results found for this basename: LIPASE, AMYLASE,  in the last 168 hours No results found for this basename: AMMONIA,  in the last 168 hours CBC:  Recent Labs Lab 06/23/13 1848 06/24/13 0410 06/25/13 0340 06/26/13 0433  WBC 22.9* 18.7* 11.1* 9.6  NEUTROABS 21.3* 17.0*  --   --   HGB 12.0* 11.4* 11.8* 12.8*  HCT 36.6* 34.2* 34.8* 38.4*  MCV 96.8 97.4 95.9 95.3  PLT 192 170 147* 193   Cardiac Enzymes:  Recent Labs Lab 06/23/13 1848 06/24/13 0410  TROPONINI <0.30 <0.30   BNP (last 3 results)  Recent Labs  06/23/13 1832  PROBNP 2292.0*    CBG:  Recent Labs Lab 06/25/13 0752 06/25/13 1231 06/25/13 1647 06/25/13 2128 06/26/13 0758  GLUCAP 188* 192* 184* 204* 195*    Recent Results (from the past 240 hour(s))  CULTURE, BLOOD (ROUTINE X 2)     Status: None   Collection Time    06/23/13  8:45 PM      Result Value Range Status   Specimen Description BLOOD RIGHT ARM   Final   Special Requests BOTTLES DRAWN AEROBIC AND ANAEROBIC 5CC   Final   Culture  Setup Time     Final   Value: 06/24/2013 02:51     Performed at [facility]   Culture     Final   Value:        BLOOD CULTURE RECEIVED NO GROWTH TO DATE CULTURE WILL BE HELD FOR 5 DAYS BEFORE ISSUING A FINAL NEGATIVE REPORT     Performed at [facility]   Report Status PENDING   Incomplete  URINE CULTURE     Status: None   Collection Time    06/23/13  8:51 PM      Result Value Range Status   Specimen Description URINE, CLEAN CATCH   Final   Special Requests NONE   Final   Culture  Setup Time     Final   Value: 06/24/2013 03:32     Performed at [facility] Count     Final   Value: NO GROWTH     Performed at [facility]   Culture     Final   Value: NO GROWTH     Performed at [facility]   Report Status 06/25/2013 FINAL   Final  CLOSTRIDIUM DIFFICILE BY PCR     Status: None   Collection Time    06/24/13  1:44 AM      Result Value Range Status   C difficile by pcr NEGATIVE  NEGATIVE Final   Comment: Performed at [facility]     Studies: Dg Abd 1 View  06/25/2013   *RADIOLOGY REPORT*  Clinical Data: Abdominal pain, distension  ABDOMEN - 1 VIEW  Comparison: None.  Findings: Diffuse gaseous distension of small and large bowel. This appearance favors adynamic ileus over small bowel obstruction.  Degenerative changes of the visualized thoracolumbar spine and bilateral hips.  IMPRESSION: Diffuse gaseous distension of small and large bowel.  This appearance favors adynamic ileus over small bowel obstruction.    Original Report Authenticated By: Charline Bills, M.D.   Dg Chest Port 1 View  06/24/2013   *RADIOLOGY REPORT*  Clinical Data: Dyspnea and wheezing  PORTABLE CHEST - 1 VIEW  Comparison: 06/23/2013  Findings: The cardiac silhouette is mildly enlarged. No mediastinal or hilar  masses are noted.  The lungs are clear.  No pleural effusion or pneumothorax.  The bony thorax is intact.  IMPRESSION: No acute cardiopulmonary disease.  No change from the previous day's study.   Original Report Authenticated By: Amie Portland, M.D.    Scheduled Meds: . enoxaparin (LOVENOX) injection  165 mg Subcutaneous BID  . furosemide  80 mg Intravenous BID  . insulin aspart  0-15 Units Subcutaneous TID WC  . losartan  50 mg Oral q morning - 10a  . metoprolol succinate  12.5 mg Oral BID  . polyethylene glycol  17 g Oral BID  . potassium chloride  10 mEq Intravenous Q1 Hr x 5  . potassium chloride  40 mEq Oral TID  . simvastatin  10 mg Oral q1800  . sodium chloride  3 mL Intravenous Q12H  . sodium chloride  3 mL Intravenous Q12H  . vancomycin  1,500 mg Intravenous Q12H  . warfarin  10 mg Oral ONCE-1800  . Warfarin - Pharmacist Dosing Inpatient   Does not apply q1800   Continuous Infusions:   Principal Problem:   Fever Active Problems:   Cellulitis   CHF (congestive heart failure)   Atrial fibrillation   Diarrhea   HTN (hypertension)   Diabetes mellitus   Hyperlipidemia    Time spent:    [name]  [facility] Pager [phone]. If 7PM-7AM, please contact night-coverage at [email or web address], password [facility] 06/26/2013, 11:24 AM  LOS: 3 days

## 2013-06-26 NOTE — Progress Notes (Signed)
ANTIBIOTIC CONSULT NOTE  Pharmacy Consult for Vancomycin and Zosyn Indication: rule out sepsis, cellulitis  Allergies  Allergen Reactions  . Lisinopril Swelling    Tongue swelling    Patient Measurements: Height: 5\' 11"  (180.3 cm) Weight: 358 lb 11 oz (162.7 kg) IBW/kg (Calculated) : 75.3 Adjusted Body Weight: 103 kg  Vital Signs: Temp: 97.6 F (36.4 C) (08/07 0525) Temp src: Oral (08/07 0525) BP: 120/77 mmHg (08/07 0525) Pulse Rate: 69 (08/07 0525) Intake/Output from previous day: 08/06 0701 - 08/07 0700 In: 3020 [P.O.:1320; IV Piggyback:1700] Out: -  Labs:  Recent Labs  06/24/13 0410 06/25/13 0340 06/26/13 0433  WBC 18.7* 11.1* 9.6  HGB 11.4* 11.8* 12.8*  PLT 170 147* 193  CREATININE 0.89 0.90 1.05   Estimated Creatinine Clearance: 100.7 ml/min (by C-G formula based on Cr of 1.05).  Recent Labs  06/26/13 1055  VANCOTROUGH 14.8     Medications:  Anti-infectives   Start     Dose/Rate Route Frequency Ordered Stop   06/24/13 1000  vancomycin (VANCOCIN) 1,500 mg in sodium chloride 0.9 % 500 mL IVPB     1,500 mg 250 mL/hr over 120 Minutes Intravenous Every 12 hours 06/23/13 1959     06/24/13 0600  piperacillin-tazobactam (ZOSYN) IVPB 3.375 g  Status:  Discontinued     3.375 g 12.5 mL/hr over 240 Minutes Intravenous 3 times per day 06/23/13 1949 06/26/13 0830   06/23/13 2200  vancomycin (VANCOCIN) 2,000 mg in sodium chloride 0.9 % 500 mL IVPB     2,000 mg 250 mL/hr over 120 Minutes Intravenous  Once 06/23/13 1959 06/24/13 0005   06/23/13 2000  piperacillin-tazobactam (ZOSYN) IVPB 3.375 g     3.375 g 100 mL/hr over 30 Minutes Intravenous  Once 06/23/13 1949 06/23/13 2322     Assessment: 72 yo obese M admitted from home with with fever, SOB(h/o CHF), and body aches. Pharmacy is asked to dose empiric abx for possible sepsis, cellulitis.  Day #4 Vancomycin (Zosyn d/c after 4 days)  SCr remains stable, CrCl ~66 ml/min (N)  Vancomycin trough level (14.8) is  therapeutic.  Goal of Therapy:  Vancomycin trough level 15-20 mcg/ml  Plan:   Continue Vancomycin 1500mg  IV q12h.  Follow up renal fxn and culture results.   Lynann Beaver PharmD, BCPS Pager [phone] 06/26/2013 12:45 PM

## 2013-06-26 NOTE — Evaluation (Addendum)
Physical Therapy Evaluation Patient Details Name: Edwin [name] MRN: [id number] DOB: 04-28-1941 Today's Date: 06/26/2013 Time: 4098-1191 PT Time Calculation (min): 25 min  PT Assessment / Plan / Recommendation History of Present Illness   Edwin [name] is a 72 y.o. male history of CHF, diabetes mellitus type 2, hyperlipidemia, A. fib on Coumadin presented with complaints of having fever chills and feeling weak and short of breath. Patient has been having these symptoms for last 2 days. Denies any chest pain or productive cough. Last 2 days patient has had diarrhea 3-4 episodes. Denies any vomiting. In the ER patient was found to have right lower extremity erythema actually up to mid thigh. Patient was found to be febrile and lactic acid was found to be elevated with elevated leukocyte counts. Patient was initially given 2 L normal saline bolus and started on vancomycin and Zosyn for cellulitis. Patient admitted with for further management.  Clinical Impression  Pt will benefit from PT to address deficits below;     PT Assessment  Patient needs continued PT services    Follow Up Recommendations  No PT follow up    Does the patient have the potential to tolerate intense rehabilitation      Barriers to Discharge        Equipment Recommendations       Recommendations for Other Services     Frequency Min 3X/week    Precautions / Restrictions Precautions Precautions: Fall Restrictions Weight Bearing Restrictions: No   Pertinent Vitals/Pain Before>during >after-- amb Sats 97%-->98%-->96% on RA HR 62-->76-->76  Pt denies pain         Mobility  Bed Mobility Bed Mobility: Not assessed Details for Bed Mobility Assistance: pt sleeps in recliner at home Transfers Transfers: Sit to Stand;Stand to Sit Sit to Stand: 4: Min assist;4: Min guard;Without upper extremity assist;From chair/3-in-1 Stand to Sit: 4: Min assist;4: Min guard;To chair/3-in-1 Details for Transfer Assistance:  cues for hand placement and to control descent Ambulation/Gait Ambulation/Gait Assistance: 4: Min guard Ambulation Distance (Feet): 60 Feet Assistive device: Rolling walker Ambulation/Gait Assistance Details: cues for RW distance from self Gait Pattern: Step-through pattern;Decreased hip/knee flexion - left;Decreased hip/knee flexion - right;Decreased dorsiflexion - right;Decreased dorsiflexion - left Gait velocity: decr General Gait Details: one standing break ~30seconds to monitor sats    Exercises General Exercises - Lower Extremity Ankle Circles/Pumps: AROM;Both;15 reps Quad Sets: AROM;Both;15 reps Hip ABduction/ADduction: AROM;15 reps (isometric ADDuction)   PT Diagnosis: Difficulty walking  PT Problem List: Decreased activity tolerance;Decreased mobility;Cardiopulmonary status limiting activity PT Treatment Interventions: Gait training;Functional mobility training;Therapeutic activities;Therapeutic exercise;Patient/family education; stair training     PT Goals(Current goals can be found in the care plan section) Acute Rehab PT Goals Patient Stated Goal: none stated PT Goal Formulation: With patient Time For Goal Achievement: 07/10/13 Potential to Achieve Goals: Good  Visit Information  Last PT Received On: 06/26/13 Assistance Needed:  (safety, lines, chair ) History of Present Illness:  Edwin [name] is a 72 y.o. male history of CHF, diabetes mellitus type 2, hyperlipidemia, A. fib on Coumadin presented with complaints of having fever chills and feeling weak and short of breath. Patient has been having these symptoms for last 2 days. Denies any chest pain or productive cough. Last 2 days patient has had diarrhea 3-4 episodes. Denies any vomiting. In the ER patient was found to have right lower extremity erythema actually up to mid thigh. Patient was found to be febrile and lactic acid was found to be elevated with elevated  leukocyte counts. Patient was initially given 2 L normal  saline bolus and started on vancomycin and Zosyn for cellulitis. Patient admitted with for further management.       Prior Functioning  Home Living Family/patient expects to be discharged to:: Private residence Living Arrangements: Spouse/significant other Type of Home: House Home Access: Stairs to enter Home Layout: Two level Alternate Level Stairs-Number of Steps: sleeps in recliner  Home Equipment: Cane - single point;Walker - 2 wheels Additional Comments: pt goes up stairs once a day to shower Prior Function Level of Independence: Independent Communication Communication: No difficulties    Cognition  Cognition Arousal/Alertness: Awake/alert (sleepy) Behavior During Therapy: WFL for tasks assessed/performed Overall Cognitive Status: Within Functional Limits for tasks assessed    Extremity/Trunk Assessment Upper Extremity Assessment Upper Extremity Assessment: Overall WFL for tasks assessed Lower Extremity Assessment Lower Extremity Assessment: Overall WFL for tasks assessed (pt limited by edema/cellulitis/soft tissue approximation)   Balance    End of Session PT - End of Session Equipment Utilized During Treatment: Gait belt Activity Tolerance: Patient tolerated treatment well Patient left: in chair;with call bell/phone within reach  GP     [facility] 06/26/2013, 12:38 PM

## 2013-06-26 NOTE — Progress Notes (Signed)
ANTICOAGULATION CONSULT NOTE  Pharmacy Consult for Lovenox, Warfarin Indication: atrial fibrillation  Allergies  Allergen Reactions  . Lisinopril Swelling    Tongue swelling    Patient Measurements: Height: 5\' 11"  (180.3 cm) Weight: 358 lb 11 oz (162.7 kg) IBW/kg (Calculated) : 75.3  Vital Signs: Temp: 97.6 F (36.4 C) (08/07 0525) Temp src: Oral (08/07 0525) BP: 120/77 mmHg (08/07 0525) Pulse Rate: 69 (08/07 0525)  Labs:  Recent Labs  06/23/13 1848 06/24/13 0410 06/25/13 0340 06/26/13 0433  HGB 12.0* 11.4* 11.8* 12.8*  HCT 36.6* 34.2* 34.8* 38.4*  PLT 192 170 147* 193  APTT 34  --   --   --   LABPROT 17.7*  --  17.2* 18.6*  INR 1.50*  --  1.44 1.60*  CREATININE 0.94 0.89 0.90 1.05  TROPONINI <0.30 <0.30  --   --     Estimated Creatinine Clearance: 100.7 ml/min (by C-G formula based on Cr of 1.05).  Medications:  Scheduled:  . enoxaparin (LOVENOX) injection  165 mg Subcutaneous BID  . furosemide  80 mg Intravenous BID  . insulin aspart  0-15 Units Subcutaneous TID WC  . losartan  50 mg Oral q morning - 10a  . metoprolol succinate  12.5 mg Oral BID  . polyethylene glycol  17 g Oral BID  . potassium chloride  10 mEq Intravenous Q1 Hr x 5  . potassium chloride  40 mEq Oral TID  . simvastatin  10 mg Oral q1800  . sodium chloride  3 mL Intravenous Q12H  . sodium chloride  3 mL Intravenous Q12H  . vancomycin  1,500 mg Intravenous Q12H  . Warfarin - Pharmacist Dosing Inpatient   Does not apply q1800   Infusions:    Assessment: 72 yo admitted with fever/probable sepsis on chronic warfarin for A-fib. Pharmacy asked to dose warfarin inpatient.  Pharmacy asked to add Lovenox bridge on 8/6 d/t subtherapeutic INR and active Afib. INR subtherapeutic (1.6) but increasing.  Repeat boosted dose again today. Hgb and Plt improved.   CrCl ~ 62 ml/min (N)   Goal of Therapy:  INR 2-3 Anti-Xa level 0.6-1.2 units/ml 4hrs after LMWH dose given Monitor platelets by  anticoagulation protocol: Yes   Plan:   Repeat Warfarin 10mg  PO today at 1800.  Continue Lovenox 165mg  SQ BID.  Daily INR, CBC.  Follow up Anti-Xa level as needed.  Lynann Beaver PharmD, BCPS Pager [phone] 06/26/2013 10:24 AM

## 2013-06-26 NOTE — Progress Notes (Signed)
Inpatient Diabetes Program Recommendations  AACE/ADA: New Consensus Statement on Inpatient Glycemic Control (2013)  Target Ranges:  Prepandial:   less than 140 mg/dL      Peak postprandial:   less than 180 mg/dL (1-2 hours)      Critically ill patients:  140 - 180 mg/dL   Reason for Visit: *Hyperglycemia Results for KALMEN, LOLLAR (MRN [id number]) as of 06/26/2013 11:56  Ref. Range 06/25/2013 12:31 06/25/2013 16:47 06/25/2013 21:28 06/26/2013 07:58 06/26/2013 11:27  Glucose-Capillary Latest Range: 70-99 mg/dL 409 (H) 811 (H) 914 (H) 195 (H) 201 (H)   Recommendation:  Add Lantus 10 units QHS and uptitrate until FBS<150 mg/dL Add HS correction.  Will continue to follow.  Thank you. Ailene Ards, RD, LDN, CDE Inpatient Diabetes Coordinator [phone]

## 2013-06-27 ENCOUNTER — Inpatient Hospital Stay (HOSPITAL_COMMUNITY): Payer: MEDICARE

## 2013-06-27 DIAGNOSIS — K560 Paralytic ileus: Secondary | ICD-10-CM

## 2013-06-27 LAB — CBC
MCH: 32.3 pg (ref 26.0–34.0)
MCHC: 34.1 g/dL (ref 30.0–36.0)
Platelets: 197 10*3/uL (ref 150–400)
RBC: 4.06 MIL/uL — ABNORMAL LOW (ref 4.22–5.81)
RDW: 14.8 % (ref 11.5–15.5)

## 2013-06-27 LAB — BASIC METABOLIC PANEL
BUN: 16 mg/dL (ref 6–23)
Calcium: 8.7 mg/dL (ref 8.4–10.5)
Creatinine, Ser: 1.2 mg/dL (ref 0.50–1.35)
GFR calc non Af Amer: 59 mL/min — ABNORMAL LOW (ref >90)
Glucose, Bld: 220 mg/dL — ABNORMAL HIGH (ref 70–99)
Sodium: 131 mEq/L — ABNORMAL LOW (ref 135–145)

## 2013-06-27 LAB — GLUCOSE, CAPILLARY
Glucose-Capillary: 218 mg/dL — ABNORMAL HIGH (ref 70–99)
Glucose-Capillary: 192 mg/dL — ABNORMAL HIGH (ref 70–99)

## 2013-06-27 LAB — MAGNESIUM: Magnesium: 1.8 mg/dL (ref 1.5–2.5)

## 2013-06-27 MED ORDER — METOPROLOL TARTRATE 1 MG/ML IV SOLN
5.0000 mg | Freq: Three times a day (TID) | INTRAVENOUS | Status: DC
  Filled 2013-06-27: qty 5

## 2013-06-27 MED ORDER — MAGNESIUM SULFATE 40 MG/ML IJ SOLN
2.0000 g | Freq: Once | INTRAMUSCULAR | Status: AC
  Administered 2013-06-27: 2 g via INTRAVENOUS
  Filled 2013-06-27: qty 50

## 2013-06-27 MED ORDER — WARFARIN SODIUM 7.5 MG PO TABS
7.5000 mg | ORAL_TABLET | Freq: Once | ORAL | Status: AC
  Administered 2013-06-27: 7.5 mg via ORAL
  Filled 2013-06-27: qty 1

## 2013-06-27 MED ORDER — POTASSIUM CHLORIDE 10 MEQ/100ML IV SOLN
10.0000 meq | INTRAVENOUS | Status: AC
  Administered 2013-06-27 (×5): 10 meq via INTRAVENOUS
  Filled 2013-06-27 (×5): qty 100

## 2013-06-27 NOTE — Progress Notes (Signed)
After soap suds enema, patient went to restroom and said he had a BM. Patient had already flushed prior to me visualizing BM but he stated that it was "watery, yellow, brown color." Will continue to monitor and instructed patient to allow RN to view prior to flushing.

## 2013-06-27 NOTE — Progress Notes (Signed)
Patient had two pauses, less than 3 seconds, while sleeping. Patient HR dropped to 30s unsustained and very brief.  MD made aware. No new orders received at this time.  Will continue to monitor.

## 2013-06-27 NOTE — Progress Notes (Signed)
[facility] PROGRESS NOTE  Edwin [name] ZOX:[id number] DOB: 04-Feb-1941 DOA: 06/23/2013 PCP: Aura Dials, MD Brief narrative Chief Complaint: Fever.  HPI: Edwin [name] is a 72 y.o. male history of CHF, diabetes mellitus type 2, hyperlipidemia, A. fib on Coumadin presented with complaints of having fever chills and feeling weak. In the ER patient was found to have right lower extremity erythema actually up to mid thigh. Patient was found to be febrile and lactic acid was found to be elevated with elevated leukocyte counts. Patient was initially given 2 L normal saline bolus and started on vancomycin and Zosyn for cellulitis. His R leg cellulitis has much improved, subsequently has been having issues with volume overload, currently being diuresed and today with ileus.  Assessment/Plan: 1. SIRS/Right lower extremity cellulitis.  -significant improvement now -Continue Vancomycin Day4 -could change to PO Abx once ileus resolves. -blood Cx NGTD -dopplers negative for DVT  2. 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 No wall motion abnormalities. Left atrium is severely dilated. - unable to get acurate I/Os  3. Atrial fibrillation - rate controlled. Continue Coumadin per pharmacy, INR sub-therapeutic, being bridged with lovenox per pharmacy Will hold beta blocker today secondary to bradycardia. May need to resume a low dose if needed for heart rate.  4. Diabetes mellitus type 2 - closely follow CBGs with sliding-scale, hold metformin,  AIC 7.9  5. Hypertension - DC Lopressor secondary to bradycardia.  6.  Hypokalemia-replace       -bmet in am  7.   Morbid obesity  #8 asymptomatic bradycardia Patient not noted to have bradycardia in the 30s while sleeping however improve on awakening. Per nursing patient with some 2 second pauses. Will discontinue patient's Lopressor. Follow.  9. Ileus: abd distension/bloating from  last pm     -asymptomatic with no N/V or pain today     -likely from narcotics, hypokalemia     -Patient with some loose stools per nursing.     -d/c miralax BID, replace K IV and PO     -repeat KUB in am      Cdiff negative     -Abdominal films with no significant improvement. Will make patient n.p.o. Mobilize. Replace electrolytes. Will try an enema. Follow.  DVT proph: on coumadin/lovenox  Code Status: FuLL Family Communication: none at bedside Disposition Plan:home in 2-3days if stable   HPI/Subjective: Feels ok, belly feels ok, no vomiting, tolerating diet, was bloated and distended last pm,  no further fevers. Patient has been having some loose stool. Objective: Filed Vitals:   06/27/13 0558  BP: 133/83  Pulse: 63  Temp: 97.8 F (36.6 C)  Resp: 22    Intake/Output Summary (Last 24 hours) at 06/27/13 1050 Last data filed at 06/27/13 0900  Gross per 24 hour  Intake   1060 ml  Output    150 ml  Net    910 ml   Filed Weights   06/25/13 0505 06/26/13 0525 06/27/13 0558  Weight: 164.2 kg (361 lb 15.9 oz) 162.7 kg (358 lb 11 oz) 162.9 kg (359 lb 2.1 oz)    Exam:   General: AAOx3, morbidly obese  Cardiovascular: S1S2/RRR  Respiratory:CTAB  Abdomen:  Nt, BS present, distended mildly tight  Musculoskeletal: erythema and tenderness of RLE tracking up medical thigh  2plus edema with chronic venous stasis changes superimposed  Data Reviewed: Basic Metabolic Panel:  Recent Labs Lab 06/23/13 1848 06/24/13  0410 06/25/13 0340 06/26/13 0433 06/27/13 0420  NA 132* 131* 129* 131* 131*  K 3.7 2.7* 3.0* 2.9* 3.5  CL 90* 93* 93* 94* 97  CO2 26 29 27 26 22   GLUCOSE 270* 222* 198* 209* 220*  BUN 11 11 9 12 16   CREATININE 0.94 0.89 0.90 1.05 1.20  CALCIUM 8.8 8.0* 8.0* 8.3* 8.7  MG  --   --   --   --  1.8   Liver Function Tests:  Recent Labs Lab 06/23/13 1848 06/24/13 0410  AST 47* 32  ALT 26 21  ALKPHOS 58 46  BILITOT 1.4* 1.3*  PROT 7.4 6.2  ALBUMIN  3.1* 2.7*   No results found for this basename: LIPASE, AMYLASE,  in the last 168 hours No results found for this basename: AMMONIA,  in the last 168 hours CBC:  Recent Labs Lab 06/23/13 1848 06/24/13 0410 06/25/13 0340 06/26/13 0433 06/27/13 0420  WBC 22.9* 18.7* 11.1* 9.6 12.0*  NEUTROABS 21.3* 17.0*  --   --   --   HGB 12.0* 11.4* 11.8* 12.8* 13.1  HCT 36.6* 34.2* 34.8* 38.4* 38.4*  MCV 96.8 97.4 95.9 95.3 94.6  PLT 192 170 147* 193 197   Cardiac Enzymes:  Recent Labs Lab 06/23/13 1848 06/24/13 0410  TROPONINI <0.30 <0.30   BNP (last 3 results)  Recent Labs  06/23/13 1832  PROBNP 2292.0*   CBG:  Recent Labs Lab 06/26/13 0758 06/26/13 1127 06/26/13 1705 06/26/13 2150 06/27/13 0743  GLUCAP 195* 201* 187* 173* 209*    Recent Results (from the past 240 hour(s))  CULTURE, BLOOD (ROUTINE X 2)     Status: None   Collection Time    06/23/13  8:45 PM      Result Value Range Status   Specimen Description BLOOD RIGHT ARM   Final   Special Requests BOTTLES DRAWN AEROBIC AND ANAEROBIC 5CC   Final   Culture  Setup Time     Final   Value: 06/24/2013 02:51     Performed at [facility]   Culture     Final   Value:        BLOOD CULTURE RECEIVED NO GROWTH TO DATE CULTURE WILL BE HELD FOR 5 DAYS BEFORE ISSUING A FINAL NEGATIVE REPORT     Performed at [facility]   Report Status PENDING   Incomplete  URINE CULTURE     Status: None   Collection Time    06/23/13  8:51 PM      Result Value Range Status   Specimen Description URINE, CLEAN CATCH   Final   Special Requests NONE   Final   Culture  Setup Time     Final   Value: 06/24/2013 03:32     Performed at [facility] Count     Final   Value: NO GROWTH     Performed at [facility]   Culture     Final   Value: NO GROWTH     Performed at [facility]   Report Status 06/25/2013 FINAL   Final  CLOSTRIDIUM DIFFICILE BY PCR     Status: None   Collection Time     06/24/13  1:44 AM      Result Value Range Status   C difficile by pcr NEGATIVE  NEGATIVE Final   Comment: Performed at [facility]     Studies: Dg Abd 1 View  06/27/2013   *RADIOLOGY REPORT*  Clinical Data: Nausea, ileus  ABDOMEN - 1 VIEW  Comparison: June 25, 2013  Findings: There are air-filled dilated small and large bowel loops in the abdomen and pelvis. The caliber of the small bowel loops are slightly increased compared prior exam.  There is no free air.  IMPRESSION: Persistent dilated small large bowel loops.  The small bowel loop caliber are slightly increased compared prior exam.  Developing small bowel obstruction is not excluded.   Original Report Authenticated By: Sherian Rein, M.D.   Dg Abd 1 View  06/25/2013   *RADIOLOGY REPORT*  Clinical Data: Abdominal pain, distension  ABDOMEN - 1 VIEW  Comparison: None.  Findings: Diffuse gaseous distension of small and large bowel. This appearance favors adynamic ileus over small bowel obstruction.  Degenerative changes of the visualized thoracolumbar spine and bilateral hips.  IMPRESSION: Diffuse gaseous distension of small and large bowel.  This appearance favors adynamic ileus over small bowel obstruction.   Original Report Authenticated By: Charline Bills, M.D.    Scheduled Meds: . enoxaparin (LOVENOX) injection  165 mg Subcutaneous BID  . furosemide  80 mg Intravenous BID  . insulin aspart  0-15 Units Subcutaneous TID WC  . losartan  50 mg Oral q morning - 10a  . magnesium sulfate 1 - 4 g bolus IVPB  2 g Intravenous Once  . metoprolol succinate  12.5 mg Oral BID  . pantoprazole (PROTONIX) IV  40 mg Intravenous Q24H  . potassium chloride  10 mEq Intravenous Q1 Hr x 5  . simvastatin  10 mg Oral q1800  . sodium chloride  3 mL Intravenous Q12H  . sodium chloride  3 mL Intravenous Q12H  . vancomycin  1,500 mg Intravenous Q12H  . warfarin  7.5 mg Oral ONCE-1800  . Warfarin - Pharmacist Dosing Inpatient   Does not apply q1800    Continuous Infusions:   Principal Problem:   Fever Active Problems:   Cellulitis   CHF (congestive heart failure)   Atrial fibrillation   Diarrhea   HTN (hypertension)   Diabetes mellitus   Hyperlipidemia    Time spent:    [facility]  [facility] Pager [phone]. If 7PM-7AM, please contact night-coverage at [email or web address], password [email or web address] 06/27/2013, 10:50 AM  LOS: 4 days

## 2013-06-27 NOTE — Progress Notes (Signed)
PT Cancellation Note  ___Treatment cancelled today due to medical issues with patient which prohibited therapy  ___ Treatment cancelled today due to patient receiving   ___ Treatment cancelled today due to patient's refusal to participate   _X_ Treatment cancelled today due to pt amb in hallway earlier "the full unit" and now receiving a soap suds enema.  Felecia Shelling  PTA [facility]  Acute  Rehab Pager      [phone]

## 2013-06-27 NOTE — Progress Notes (Signed)
ANTICOAGULATION CONSULT NOTE  Pharmacy Consult for Lovenox, Warfarin Indication: atrial fibrillation  Allergies  Allergen Reactions  . Lisinopril Swelling    Tongue swelling    Patient Measurements: Height: 5\' 11"  (180.3 cm) Weight: 359 lb 2.1 oz (162.9 kg) IBW/kg (Calculated) : 75.3  Vital Signs: Temp: 97.8 F (36.6 C) (08/08 0558) Temp src: Oral (08/08 0558) BP: 133/83 mmHg (08/08 0558) Pulse Rate: 63 (08/08 0558)  Labs:  Recent Labs  06/25/13 0340 06/26/13 0433 06/27/13 0420  HGB 11.8* 12.8* 13.1  HCT 34.8* 38.4* 38.4*  PLT 147* 193 197  LABPROT 17.2* 18.6* 22.0*  INR 1.44 1.60* 1.99*  CREATININE 0.90 1.05 1.20    Estimated Creatinine Clearance: 88.1 ml/min (by C-G formula based on Cr of 1.2).  Medications:  Scheduled:  . enoxaparin (LOVENOX) injection  165 mg Subcutaneous BID  . furosemide  80 mg Intravenous BID  . insulin aspart  0-15 Units Subcutaneous TID WC  . losartan  50 mg Oral q morning - 10a  . metoprolol succinate  12.5 mg Oral BID  . pantoprazole (PROTONIX) IV  40 mg Intravenous Q24H  . polyethylene glycol  17 g Oral BID  . potassium chloride  40 mEq Oral TID  . simvastatin  10 mg Oral q1800  . sodium chloride  3 mL Intravenous Q12H  . sodium chloride  3 mL Intravenous Q12H  . vancomycin  1,500 mg Intravenous Q12H  . Warfarin - Pharmacist Dosing Inpatient   Does not apply q1800   Infusions:    Assessment: 72 yo admitted with fever/probable sepsis on chronic warfarin for A-fib. Pharmacy asked to dose warfarin inpatient.  Pharmacy asked to add Lovenox bridge on 8/6 d/t subtherapeutic INR and active Afib. INR (1.99) increasing to near therapeutic level.   Hgb and Plt improved.   CrCl ~ 57 ml/min (N)   Goal of Therapy:  INR 2-3 Anti-Xa level 0.6-1.2 units/ml 4hrs after LMWH dose given Monitor platelets by anticoagulation protocol: Yes   Plan:   Warfarin 7.5 mg PO today at 1800.  Continue Lovenox 165mg  SQ BID.  Daily INR,  CBC.  Follow up Anti-Xa level as needed.  Lynann Beaver PharmD, BCPS Pager [phone] 06/27/2013 7:44 AM

## 2013-06-27 NOTE — Progress Notes (Signed)
Patient had a very brief brady episode unsustained into the 30s while sleeping. MD paged and made aware. Orders were written for Magnesium level. Will continue to monitor.

## 2013-06-28 ENCOUNTER — Inpatient Hospital Stay (HOSPITAL_COMMUNITY): Payer: MEDICARE

## 2013-06-28 LAB — BASIC METABOLIC PANEL
BUN: 16 mg/dL (ref 6–23)
CO2: 29 mEq/L (ref 19–32)
Calcium: 8.9 mg/dL (ref 8.4–10.5)
Creatinine, Ser: 1.33 mg/dL (ref 0.50–1.35)
GFR calc non Af Amer: 52 mL/min — ABNORMAL LOW (ref >90)
Glucose, Bld: 180 mg/dL — ABNORMAL HIGH (ref 70–99)
Sodium: 135 mEq/L (ref 135–145)

## 2013-06-28 LAB — CBC
MCH: 32.2 pg (ref 26.0–34.0)
MCHC: 34.1 g/dL (ref 30.0–36.0)
MCV: 94.4 fL (ref 78.0–100.0)
Platelets: 221 10*3/uL (ref 150–400)
RBC: 4.1 MIL/uL — ABNORMAL LOW (ref 4.22–5.81)
RDW: 14.8 % (ref 11.5–15.5)

## 2013-06-28 LAB — GLUCOSE, CAPILLARY: Glucose-Capillary: 149 mg/dL — ABNORMAL HIGH (ref 70–99)

## 2013-06-28 LAB — MAGNESIUM: Magnesium: 2.2 mg/dL (ref 1.5–2.5)

## 2013-06-28 MED ORDER — WARFARIN SODIUM 2.5 MG PO TABS
2.5000 mg | ORAL_TABLET | Freq: Once | ORAL | Status: AC
  Administered 2013-06-28: 2.5 mg via ORAL
  Filled 2013-06-28: qty 1

## 2013-06-28 MED ORDER — CEFAZOLIN SODIUM 1-5 GM-% IV SOLN
1.0000 g | Freq: Three times a day (TID) | INTRAVENOUS | Status: DC
  Administered 2013-06-28 – 2013-07-04 (×19): 1 g via INTRAVENOUS
  Filled 2013-06-28 (×21): qty 50

## 2013-06-28 NOTE — Progress Notes (Signed)
ANTICOAGULATION CONSULT NOTE  Pharmacy Consult for Lovenox, Warfarin Indication: atrial fibrillation  Allergies  Allergen Reactions  . Lisinopril Swelling    Tongue swelling    Patient Measurements: Height: 5\' 11"  (180.3 cm) Weight: 357 lb 9.6 oz (162.206 kg) IBW/kg (Calculated) : 75.3  Vital Signs: Temp: 97.5 F (36.4 C) (08/09 0551) Temp src: Oral (08/09 0551) BP: 123/67 mmHg (08/09 0551) Pulse Rate: 70 (08/09 0551)  Labs:  Recent Labs  06/26/13 0433 06/27/13 0420 06/28/13 0513  HGB 12.8* 13.1 13.2  HCT 38.4* 38.4* 38.7*  PLT 193 197 221  LABPROT 18.6* 22.0* 28.3*  INR 1.60* 1.99* 2.77*  CREATININE 1.05 1.20 1.33    Estimated Creatinine Clearance: 79.3 ml/min (by C-G formula based on Cr of 1.33).  Medications:  Scheduled:  . furosemide  80 mg Intravenous BID  . insulin aspart  0-15 Units Subcutaneous TID WC  . losartan  50 mg Oral q morning - 10a  . pantoprazole (PROTONIX) IV  40 mg Intravenous Q24H  . sodium chloride  3 mL Intravenous Q12H  . sodium chloride  3 mL Intravenous Q12H  . vancomycin  1,500 mg Intravenous Q12H  . Warfarin - Pharmacist Dosing Inpatient   Does not apply q1800   Infusions:    Assessment: 72 yo chronic anticoagulation with warfarin for A-fib. Pharmacy asked to dose warfarin inpatient.  Pharmacy asked to add Lovenox bridge on 8/6 d/t subtherapeutic INR and active Afib. INR is now therapeutic with significant increase from yesterday 1.99 -- > 2.77 Hgb and Plt okay   Goal of Therapy:  INR 2-3 Monitor platelets by anticoagulation protocol: Yes   Plan:   Warfarin 2.5 mg PO today at 1800.  Discontinue Lovenox 165mg  SQ BID.  Daily PT/INR  Clydene Fake PharmD Pager #: [phone] 3:30 PM 06/28/2013

## 2013-06-28 NOTE — Progress Notes (Signed)
In to obtain consent and place PICC. Patient stated he would rather "just get an IV". 20g PIV started in R arm on 1 attempt.

## 2013-06-28 NOTE — Progress Notes (Signed)
[facility] PROGRESS NOTE  Edwin [name] EXB:[id number] DOB: December 17, 1940 DOA: 06/23/2013 PCP: Aura Dials, MD Brief narrative Chief Complaint: Fever.  HPI: Edwin [name] is a 72 y.o. male history of CHF, diabetes mellitus type 2, hyperlipidemia, A. fib on Coumadin presented with complaints of having fever chills and feeling weak. In the ER patient was found to have right lower extremity erythema actually up to mid thigh. Patient was found to be febrile and lactic acid was found to be elevated with elevated leukocyte counts. Patient was initially given 2 L normal saline bolus and started on vancomycin and Zosyn for cellulitis. His R leg cellulitis has much improved, subsequently has been having issues with volume overload, currently being diuresed and today with ileus.  Assessment/Plan: 1. SIRS/Right lower extremity cellulitis.  -significant improvement now -Continue Vancomycin Day5, change to IV ancef. -could change to PO Abx once ileus resolves. -blood Cx NGTD -dopplers negative for DVT  2. 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 No wall motion abnormalities. Left atrium is severely dilated. - unable to get acurate I/Os  3. Atrial fibrillation - rate controlled currently. Continue Coumadin per pharmacy, INR therapeutic, DC Lovenox. Beta blocker on hold secondary to bradycardia. May need to resume a low dose if needed for heart rate.  4. Diabetes mellitus type 2 - closely follow CBGs with sliding-scale, hold metformin,  AIC 7.9  5. Hypertension - DC Lopressor secondary to bradycardia. Blood pressure stable follow  6.  Hypokalemia-replace       -bmet in am  7.   Morbid obesity  #8 asymptomatic bradycardia Patient not noted to have bradycardia in the 30s while sleeping however improve on awakening. Per nursing patient with some 2 second pauses. Bradycardia improved off Lopressor. Follow.  9. Ileus: abd  distension/bloating from last pm     -asymptomatic with no N/V or pain today     -likely from narcotics, hypokalemia     -Patient with some loose stools per nursing.     -d/c miralax BID, replace K IV and PO     -repeat KUB in am      Cdiff negative     -Abdominal films with no significant improvement. Continue n.p.o. Mobilize. Replace electrolytes. Will try an enema. Follow.  DVT proph: on coumadin/lovenox  Code Status: FuLL Family Communication: Updated patient and son at bedside Disposition Plan:home in 2-3days if ileus resolves   HPI/Subjective: Feels ok, belly feels ok, no vomiting, tolerating diet, was bloated and distended last pm,  no further fevers. Patient with no significant bowel movement. Patient states saw lots of liquid. Objective: Filed Vitals:   06/28/13 0551  BP: 123/67  Pulse: 70  Temp: 97.5 F (36.4 C)  Resp: 21    Intake/Output Summary (Last 24 hours) at 06/28/13 1052 Last data filed at 06/27/13 2047  Gross per 24 hour  Intake   1050 ml  Output   1300 ml  Net   -250 ml   Filed Weights   06/26/13 0525 06/27/13 0558 06/28/13 0551  Weight: 162.7 kg (358 lb 11 oz) 162.9 kg (359 lb 2.1 oz) 162.206 kg (357 lb 9.6 oz)    Exam:   General: AAOx3, morbidly obese  Cardiovascular: S1S2/RRR  Respiratory:CTAB  Abdomen:  Nt, BS present, distended mildly tight  Musculoskeletal: erythema and tenderness of RLE tracking up medical thigh  2plus edema with chronic venous stasis changes superimposed  Data Reviewed: Basic  Metabolic Panel:  Recent Labs Lab 06/24/13 0410 06/25/13 0340 06/26/13 0433 06/27/13 0420 06/28/13 0513  NA 131* 129* 131* 131* 135  K 2.7* 3.0* 2.9* 3.5 4.2  CL 93* 93* 94* 97 98  CO2 29 27 26 22 29   GLUCOSE 222* 198* 209* 220* 180*  BUN 11 9 12 16 16   CREATININE 0.89 0.90 1.05 1.20 1.33  CALCIUM 8.0* 8.0* 8.3* 8.7 8.9  MG  --   --   --  1.8 2.2   Liver Function Tests:  Recent Labs Lab 06/23/13 1848 06/24/13 0410  AST  47* 32  ALT 26 21  ALKPHOS 58 46  BILITOT 1.4* 1.3*  PROT 7.4 6.2  ALBUMIN 3.1* 2.7*   No results found for this basename: LIPASE, AMYLASE,  in the last 168 hours No results found for this basename: AMMONIA,  in the last 168 hours CBC:  Recent Labs Lab 06/23/13 1848 06/24/13 0410 06/25/13 0340 06/26/13 0433 06/27/13 0420 06/28/13 0513  WBC 22.9* 18.7* 11.1* 9.6 12.0* 10.3  NEUTROABS 21.3* 17.0*  --   --   --   --   HGB 12.0* 11.4* 11.8* 12.8* 13.1 13.2  HCT 36.6* 34.2* 34.8* 38.4* 38.4* 38.7*  MCV 96.8 97.4 95.9 95.3 94.6 94.4  PLT 192 170 147* 193 197 221   Cardiac Enzymes:  Recent Labs Lab 06/23/13 1848 06/24/13 0410  TROPONINI <0.30 <0.30   BNP (last 3 results)  Recent Labs  06/23/13 1832  PROBNP 2292.0*   CBG:  Recent Labs Lab 06/27/13 0743 06/27/13 1105 06/27/13 1632 06/27/13 2152 06/28/13 0809  GLUCAP 209* 218* 192* 175* 166*    Recent Results (from the past 240 hour(s))  CULTURE, BLOOD (ROUTINE X 2)     Status: None   Collection Time    06/23/13  8:45 PM      Result Value Range Status   Specimen Description BLOOD RIGHT ARM   Final   Special Requests BOTTLES DRAWN AEROBIC AND ANAEROBIC 5CC   Final   Culture  Setup Time     Final   Value: 06/24/2013 02:51     Performed at [facility]   Culture     Final   Value:        BLOOD CULTURE RECEIVED NO GROWTH TO DATE CULTURE WILL BE HELD FOR 5 DAYS BEFORE ISSUING A FINAL NEGATIVE REPORT     Performed at [facility]   Report Status PENDING   Incomplete  URINE CULTURE     Status: None   Collection Time    06/23/13  8:51 PM      Result Value Range Status   Specimen Description URINE, CLEAN CATCH   Final   Special Requests NONE   Final   Culture  Setup Time     Final   Value: 06/24/2013 03:32     Performed at [facility] Count     Final   Value: NO GROWTH     Performed at [facility]   Culture     Final   Value: NO GROWTH     Performed at [facility]   Report Status 06/25/2013 FINAL   Final  CLOSTRIDIUM DIFFICILE BY PCR     Status: None   Collection Time    06/24/13  1:44 AM      Result Value Range Status   C difficile by pcr NEGATIVE  NEGATIVE Final   Comment: Performed at [facility]     Studies: Dg Abd 1 View  06/28/2013   *RADIOLOGY REPORT*  Clinical Data: Follow up ileus  ABDOMEN - 1 VIEW  Comparison: Prior radiographs 06/27/2013  Findings: Persistent diffuse gaseous distension dilatation of both small and large bowel.  No significant interval change over the last 24 hours.  Relative paucity of gas overlying the rectum.  No large free air on this supine series.  No acute osseous abnormality.  IMPRESSION: Persistent ileus pattern.   Original Report Authenticated By: Malachy Moan, M.D.   Dg Abd 1 View  06/27/2013   *RADIOLOGY REPORT*  Clinical Data: Nausea, ileus  ABDOMEN - 1 VIEW  Comparison: June 25, 2013  Findings: There are air-filled dilated small and large bowel loops in the abdomen and pelvis. The caliber of the small bowel loops are slightly increased compared prior exam.  There is no free air.  IMPRESSION: Persistent dilated small large bowel loops.  The small bowel loop caliber are slightly increased compared prior exam.  Developing small bowel obstruction is not excluded.   Original Report Authenticated By: Sherian Rein, M.D.    Scheduled Meds: . enoxaparin (LOVENOX) injection  165 mg Subcutaneous BID  . furosemide  80 mg Intravenous BID  . insulin aspart  0-15 Units Subcutaneous TID WC  . losartan  50 mg Oral q morning - 10a  . pantoprazole (PROTONIX) IV  40 mg Intravenous Q24H  . sodium chloride  3 mL Intravenous Q12H  . sodium chloride  3 mL Intravenous Q12H  . vancomycin  1,500 mg Intravenous Q12H  . Warfarin - Pharmacist Dosing Inpatient   Does not apply q1800   Continuous Infusions:   Principal Problem:   Adynamic ileus Active Problems:   Fever   Cellulitis   CHF (congestive heart  failure)   Atrial fibrillation   Diarrhea   HTN (hypertension)   Diabetes mellitus   Hyperlipidemia    Time spent:    [facility]  [facility] Pager [phone]. If 7PM-7AM, please contact night-coverage at [email or web address], password [facility] 06/28/2013, 10:52 AM  LOS: 5 days

## 2013-06-28 NOTE — Progress Notes (Signed)
Patient's HR went down to as low as 30's non sustaining while asleep, asymptomatic. HR is mostly at high 60's when he is awake. Will continue to monitor patient.

## 2013-06-28 NOTE — Progress Notes (Signed)
Unable to place peripheral IV on this patient. He missed the Vancomycin IV at 12 midnight. IV team came up to evaluate and recommended PICC line insertion for IV antibiotic therapy. Patient is frequently receiving potassium IV runs and with IV peripheral, patient always end up complaining of burning sensation on every IV site. Talked to T. Claiborne Billings NP and made him aware about this.

## 2013-06-29 ENCOUNTER — Inpatient Hospital Stay (HOSPITAL_COMMUNITY): Payer: MEDICARE

## 2013-06-29 LAB — BASIC METABOLIC PANEL
BUN: 15 mg/dL (ref 6–23)
CO2: 30 mEq/L (ref 19–32)
Chloride: 99 mEq/L (ref 96–112)
Creatinine, Ser: 1.27 mg/dL (ref 0.50–1.35)
Creatinine, Ser: 1.24 mg/dL (ref 0.50–1.35)
GFR calc Af Amer: 64 mL/min — ABNORMAL LOW (ref >90)
GFR calc Af Amer: 66 mL/min — ABNORMAL LOW (ref >90)
GFR calc non Af Amer: 57 mL/min — ABNORMAL LOW (ref >90)
Sodium: 138 mEq/L (ref 135–145)

## 2013-06-29 LAB — GLUCOSE, CAPILLARY
Glucose-Capillary: 146 mg/dL — ABNORMAL HIGH (ref 70–99)
Glucose-Capillary: 154 mg/dL — ABNORMAL HIGH (ref 70–99)
Glucose-Capillary: 133 mg/dL — ABNORMAL HIGH (ref 70–99)
Glucose-Capillary: 177 mg/dL — ABNORMAL HIGH (ref 70–99)

## 2013-06-29 LAB — PROTIME-INR
INR: 3.19 — ABNORMAL HIGH (ref 0.00–1.49)
Prothrombin Time: 31.5 seconds — ABNORMAL HIGH (ref 11.6–15.2)

## 2013-06-29 MED ORDER — SIMETHICONE 80 MG PO CHEW
160.0000 mg | CHEWABLE_TABLET | Freq: Four times a day (QID) | ORAL | Status: DC
  Administered 2013-06-29 – 2013-07-08 (×30): 160 mg via ORAL
  Filled 2013-06-29 (×39): qty 2

## 2013-06-29 MED ORDER — POTASSIUM CHLORIDE 10 MEQ/100ML IV SOLN
10.0000 meq | INTRAVENOUS | Status: AC
  Administered 2013-06-29 (×4): 10 meq via INTRAVENOUS
  Filled 2013-06-29 (×4): qty 100

## 2013-06-29 NOTE — Progress Notes (Signed)
[facility] PROGRESS NOTE  Edwin [name] ZOX:[id number] DOB: 1940-12-23 DOA: 06/23/2013 PCP: Aura Dials, MD Brief narrative Chief Complaint: Fever.  HPI: Edwin [name] is a 72 y.o. male history of CHF, diabetes mellitus type 2, hyperlipidemia, A. fib on Coumadin presented with complaints of having fever chills and feeling weak. In the ER patient was found to have right lower extremity erythema actually up to mid thigh. Patient was found to be febrile and lactic acid was found to be elevated with elevated leukocyte counts. Patient was initially given 2 L normal saline bolus and started on vancomycin and Zosyn for cellulitis. His R leg cellulitis has much improved, subsequently has been having issues with volume overload, currently being diuresed and today with ileus.  Assessment/Plan: 1. SIRS/Right lower extremity cellulitis.  -significant improvement now - Vancomycin discontinued after 5 days. Continue IV Ancef antibiotic day 6/7-10 -could change to PO Abx once ileus resolves. -blood Cx NGTD -dopplers negative for DVT  2. 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 No wall motion abnormalities. Left atrium is severely dilated. - unable to get acurate I/Os  3. Atrial fibrillation - rate controlled currently. Continue Coumadin per pharmacy, INR therapeutic. Beta blocker on hold secondary to bradycardia. May need to resume a low dose if needed for heart rate.  4. Diabetes mellitus type 2 - closely follow CBGs with sliding-scale, hold metformin,  AIC 7.9  5. Hypertension - DC Lopressor secondary to bradycardia. Blood pressure stable follow  6.  Hypokalemia-replace       -bmet in am  7.   Morbid obesity  #8 asymptomatic bradycardia Patient noted to have bradycardia in the 30s while sleeping however improve on awakening. Per nursing patient with some 2 second pauses. Bradycardia improved off Lopressor. Follow.  9.  Ileus: abd distension/bloating from last pm     -asymptomatic with no N/V or pain today     -likely from narcotics, hypokalemia     -Patient with some loose stools per nursing. KUB with improving ileus.      Cdiff negative     - Continue Mobilize. Replace electrolytes. Will try an enema. Will place on clears and follow.  DVT proph: on coumadin  Code Status: FuLL Family Communication: Updated patient at bedside Disposition Plan:home in 2-3days if ileus resolves   HPI/Subjective: Feels ok, belly feels ok, no vomiting. Patient with no significant bowel movement. Patient states saw lots of liquid. Objective: Filed Vitals:   06/29/13 0448  BP: 122/71  Pulse: 60  Temp: 97.5 F (36.4 C)  Resp: 18    Intake/Output Summary (Last 24 hours) at 06/29/13 1155 Last data filed at 06/29/13 0342  Gross per 24 hour  Intake    550 ml  Output   1950 ml  Net  -1400 ml   Filed Weights   06/27/13 0558 06/28/13 0551 06/29/13 0448  Weight: 162.9 kg (359 lb 2.1 oz) 162.206 kg (357 lb 9.6 oz) 159.8 kg (352 lb 4.7 oz)    Exam:   General: AAOx3, morbidly obese  Cardiovascular: S1S2/RRR  Respiratory:CTAB  Abdomen:  Nt, BS present, distended mildly tight  Musculoskeletal: erythema and tenderness of RLE tracking up medical thigh  2plus edema with chronic venous stasis changes superimposed  Data Reviewed: Basic Metabolic Panel:  Recent Labs Lab 06/25/13 0340 06/26/13 0433 06/27/13 0420 06/28/13 0513 06/29/13 0420  NA 129* 131* 131* 135 138  K 3.0* 2.9* 3.5 4.2  3.4*  CL 93* 94* 97 98 99  CO2 27 26 22 29 30   GLUCOSE 198* 209* 220* 180* 170*  BUN 9 12 16 16 16   CREATININE 0.90 1.05 1.20 1.33 1.27  CALCIUM 8.0* 8.3* 8.7 8.9 9.0  MG  --   --  1.8 2.2  --    Liver Function Tests:  Recent Labs Lab 06/23/13 1848 06/24/13 0410  AST 47* 32  ALT 26 21  ALKPHOS 58 46  BILITOT 1.4* 1.3*  PROT 7.4 6.2  ALBUMIN 3.1* 2.7*   No results found for this basename: LIPASE, AMYLASE,  in  the last 168 hours No results found for this basename: AMMONIA,  in the last 168 hours CBC:  Recent Labs Lab 06/23/13 1848 06/24/13 0410 06/25/13 0340 06/26/13 0433 06/27/13 0420 06/28/13 0513  WBC 22.9* 18.7* 11.1* 9.6 12.0* 10.3  NEUTROABS 21.3* 17.0*  --   --   --   --   HGB 12.0* 11.4* 11.8* 12.8* 13.1 13.2  HCT 36.6* 34.2* 34.8* 38.4* 38.4* 38.7*  MCV 96.8 97.4 95.9 95.3 94.6 94.4  PLT 192 170 147* 193 197 221   Cardiac Enzymes:  Recent Labs Lab 06/23/13 1848 06/24/13 0410  TROPONINI <0.30 <0.30   BNP (last 3 results)  Recent Labs  06/23/13 1832  PROBNP 2292.0*   CBG:  Recent Labs Lab 06/28/13 0809 06/28/13 1231 06/28/13 1719 06/28/13 2130 06/29/13 0749  GLUCAP 166* 156* 149* 171* 146*    Recent Results (from the past 240 hour(s))  CULTURE, BLOOD (ROUTINE X 2)     Status: None   Collection Time    06/23/13  8:45 PM      Result Value Range Status   Specimen Description BLOOD RIGHT ARM   Final   Special Requests BOTTLES DRAWN AEROBIC AND ANAEROBIC 5CC   Final   Culture  Setup Time     Final   Value: 06/24/2013 02:51     Performed at [facility]   Culture     Final   Value:        BLOOD CULTURE RECEIVED NO GROWTH TO DATE CULTURE WILL BE HELD FOR 5 DAYS BEFORE ISSUING A FINAL NEGATIVE REPORT     Performed at [facility]   Report Status PENDING   Incomplete  URINE CULTURE     Status: None   Collection Time    06/23/13  8:51 PM      Result Value Range Status   Specimen Description URINE, CLEAN CATCH   Final   Special Requests NONE   Final   Culture  Setup Time     Final   Value: 06/24/2013 03:32     Performed at [facility] Count     Final   Value: NO GROWTH     Performed at [facility]   Culture     Final   Value: NO GROWTH     Performed at [facility]   Report Status 06/25/2013 FINAL   Final  CLOSTRIDIUM DIFFICILE BY PCR     Status: None   Collection Time    06/24/13  1:44 AM       Result Value Range Status   C difficile by pcr NEGATIVE  NEGATIVE Final   Comment: Performed at [facility]     Studies: Dg Abd 1 View  06/29/2013   *RADIOLOGY REPORT*  Clinical Data: Follow up ileus  ABDOMEN - 1 VIEW  Comparison:  06/28/2013  Findings: Improvement in large and small bowel dilatation compared with yesterday.  Findings are most consistent with improving ileus.  Gallstones are noted.  IMPRESSION: Large and small bowel loops remain dilated but improved from the prior study.  Gallstones   Original Report Authenticated By: Janeece Riggers, M.D.   Dg Abd 1 View  06/28/2013   *RADIOLOGY REPORT*  Clinical Data: Follow up ileus  ABDOMEN - 1 VIEW  Comparison: Prior radiographs 06/27/2013  Findings: Persistent diffuse gaseous distension dilatation of both small and large bowel.  No significant interval change over the last 24 hours.  Relative paucity of gas overlying the rectum.  No large free air on this supine series.  No acute osseous abnormality.  IMPRESSION: Persistent ileus pattern.   Original Report Authenticated By: Malachy Moan, M.D.    Scheduled Meds: .  ceFAZolin (ANCEF) IV  1 g Intravenous Q8H  . furosemide  80 mg Intravenous BID  . insulin aspart  0-15 Units Subcutaneous TID WC  . losartan  50 mg Oral q morning - 10a  . pantoprazole (PROTONIX) IV  40 mg Intravenous Q24H  . potassium chloride  10 mEq Intravenous Q1 Hr x 4  . simethicone  160 mg Oral QID  . sodium chloride  3 mL Intravenous Q12H  . sodium chloride  3 mL Intravenous Q12H  . Warfarin - Pharmacist Dosing Inpatient   Does not apply q1800   Continuous Infusions:   Principal Problem:   Adynamic ileus Active Problems:   Fever   Cellulitis   CHF (congestive heart failure)   Atrial fibrillation   Diarrhea   HTN (hypertension)   Diabetes mellitus   Hyperlipidemia    Time spent:    [facility]  [facility] Pager [phone]. If 7PM-7AM, please contact night-coverage at  [email or web address], password [facility] 06/29/2013, 11:55 AM  LOS: 6 days

## 2013-06-29 NOTE — Progress Notes (Signed)
ANTICOAGULATION CONSULT NOTE  Pharmacy Consult for Lovenox, Warfarin Indication: atrial fibrillation  Allergies  Allergen Reactions  . Lisinopril Swelling    Tongue swelling    Patient Measurements: Height: 5\' 11"  (180.3 cm) Weight: 352 lb 4.7 oz (159.8 kg) IBW/kg (Calculated) : 75.3  Vital Signs: Temp: 97.4 F (36.3 C) (08/10 1441) Temp src: Oral (08/10 1441) BP: 129/62 mmHg (08/10 1441) Pulse Rate: 60 (08/10 1441)  Labs:  Recent Labs  06/27/13 0420 06/28/13 0513 06/29/13 0420  HGB 13.1 13.2  --   HCT 38.4* 38.7*  --   PLT 197 221  --   LABPROT 22.0* 28.3* 31.5*  INR 1.99* 2.77* 3.19*  CREATININE 1.20 1.33 1.27    Estimated Creatinine Clearance: 82.3 ml/min (by C-G formula based on Cr of 1.27).  Medications:  Scheduled:  .  ceFAZolin (ANCEF) IV  1 g Intravenous Q8H  . furosemide  80 mg Intravenous BID  . insulin aspart  0-15 Units Subcutaneous TID WC  . losartan  50 mg Oral q morning - 10a  . pantoprazole (PROTONIX) IV  40 mg Intravenous Q24H  . simethicone  160 mg Oral QID  . sodium chloride  3 mL Intravenous Q12H  . sodium chloride  3 mL Intravenous Q12H  . Warfarin - Pharmacist Dosing Inpatient   Does not apply q1800   Infusions:    Assessment: 72 yo chronic anticoagulation with warfarin for A-fib. Pharmacy asked to dose warfarin inpatient.  Pharmacy asked to add Lovenox bridge on 8/6 d/t subtherapeutic INR and active Afib. INR is now supratherapeutic.  Will hold warfarin tonight.  Hgb and Plt okay yesterday   Goal of Therapy:  INR 2-3 Monitor platelets by anticoagulation protocol: Yes   Plan:   No warfarin tonight.   Daily PT/INR; CBC in AM   Jaelyne Deeg, Loma Messing PharmD Pager #: [phone] 4:40 PM 06/29/2013

## 2013-06-29 NOTE — Progress Notes (Signed)
Patient's HR has dropped into the 30s several times throughout the night while the patient was asleep, patient has been in a-fib throughout the night. HR increases when patient aroused, and patient is in no discomfort. Claiborne Billings, NP on call notified. No new orders at this time. Will continue to monitor.

## 2013-06-29 NOTE — Progress Notes (Signed)
Patient's heart rate drops into thirties while sleeping.  Heart rate does not sustain.  Patient awakens when stimulated.  Patient is asymptomatic.  Dr. Janee Morn notified.  Will continue to monitor patient.

## 2013-06-30 ENCOUNTER — Inpatient Hospital Stay (HOSPITAL_COMMUNITY): Payer: MEDICARE

## 2013-06-30 LAB — BASIC METABOLIC PANEL
BUN: 13 mg/dL (ref 6–23)
Calcium: 8.5 mg/dL (ref 8.4–10.5)
Creatinine, Ser: 1.2 mg/dL (ref 0.50–1.35)
GFR calc non Af Amer: 59 mL/min — ABNORMAL LOW (ref >90)
Glucose, Bld: 146 mg/dL — ABNORMAL HIGH (ref 70–99)

## 2013-06-30 LAB — GLUCOSE, CAPILLARY: Glucose-Capillary: 214 mg/dL — ABNORMAL HIGH (ref 70–99)

## 2013-06-30 LAB — CBC
MCH: 31.6 pg (ref 26.0–34.0)
MCHC: 33.8 g/dL (ref 30.0–36.0)
Platelets: 260 10*3/uL (ref 150–400)
RDW: 14.7 % (ref 11.5–15.5)

## 2013-06-30 LAB — PROTIME-INR: Prothrombin Time: 28.3 seconds — ABNORMAL HIGH (ref 11.6–15.2)

## 2013-06-30 LAB — CULTURE, BLOOD (ROUTINE X 2): Culture: NO GROWTH

## 2013-06-30 MED ORDER — POTASSIUM CHLORIDE 10 MEQ/100ML IV SOLN
10.0000 meq | INTRAVENOUS | Status: AC
  Administered 2013-06-30 (×4): 10 meq via INTRAVENOUS
  Filled 2013-06-30 (×4): qty 100

## 2013-06-30 MED ORDER — POTASSIUM CHLORIDE CRYS ER 20 MEQ PO TBCR
40.0000 meq | EXTENDED_RELEASE_TABLET | ORAL | Status: AC
  Administered 2013-06-30 (×2): 40 meq via ORAL
  Filled 2013-06-30 (×2): qty 2

## 2013-06-30 MED ORDER — BISACODYL 10 MG RE SUPP
10.0000 mg | Freq: Every day | RECTAL | Status: DC
  Administered 2013-07-01 – 2013-07-02 (×2): 10 mg via RECTAL
  Filled 2013-06-30 (×3): qty 1

## 2013-06-30 MED ORDER — WARFARIN SODIUM 5 MG PO TABS
5.0000 mg | ORAL_TABLET | Freq: Once | ORAL | Status: AC
  Administered 2013-06-30: 5 mg via ORAL
  Filled 2013-06-30: qty 1

## 2013-06-30 NOTE — Care Management Note (Signed)
    Page 1 of 1   06/30/2013     1:03:16 PM   CARE MANAGEMENT NOTE 06/30/2013  Patient:  Edwin [name], Edwin [name]   Account Number:  [id number]  Date Initiated:  06/24/2013  Documentation initiated by:  Ezekiel Ina  Subjective/Objective Assessment:   pt admitted with fever cellulitis     Action/Plan:   from home   Anticipated DC Date:  07/03/2013   Anticipated DC Plan:  HOME/SELF CARE      DC Planning Services  CM consult      Choice offered to / List presented to:             Status of service:  In process, will continue to follow Medicare Important Message given?   (If response is "NO", the following Medicare IM given date fields will be blank) Date Medicare IM given:   Date Additional Medicare IM given:    Discharge Disposition:    Per UR Regulation:  Reviewed for med. necessity/level of care/duration of stay  If discussed at Long Length of Stay Meetings, dates discussed:    Comments:  06/30/13 Sahara Fujimoto RN,BSN NCM [phone] ILEUS,CLEARS,R LEG CELLULITIS-IV ABX.CHF-IV LASIX.  06/24/13 MMcGibboney, RN, BSN Chart reviewed.

## 2013-06-30 NOTE — Progress Notes (Signed)
Patient expressed not wanting to ambulate in hall. Patient did ambulate in room with standby asst. Tolerated well. Will cont to stress ambulation in hall.

## 2013-06-30 NOTE — Progress Notes (Signed)
[facility] PROGRESS NOTE  Tayden Nichelson WJX:[id number] DOB: 11-Nov-1941 DOA: 06/23/2013 PCP: Aura Dials, MD Brief narrative Chief Complaint: Fever.  HPI: Edwin [name] is a 72 y.o. male history of CHF, diabetes mellitus type 2, hyperlipidemia, A. fib on Coumadin presented with complaints of having fever chills and feeling weak. In the ER patient was found to have right lower extremity erythema actually up to mid thigh. Patient was found to be febrile and lactic acid was found to be elevated with elevated leukocyte counts. Patient was initially given 2 L normal saline bolus and started on vancomycin and Zosyn for cellulitis. His R leg cellulitis has much improved, subsequently has been having issues with volume overload, currently being diuresed and today with ileus.  Assessment/Plan: 1. SIRS/Right lower extremity cellulitis.  -significant improvement now - Vancomycin discontinued after 5 days. Continue IV Ancef antibiotic day 7/7-10 -could change to PO Abx once ileus resolves. -blood Cx NGTD -dopplers negative for DVT  2. 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 No wall motion abnormalities. Left atrium is severely dilated. - unable to get acurate I/Os  3. Atrial fibrillation - rate controlled currently. Continue Coumadin per pharmacy, INR therapeutic. Beta blocker on hold secondary to bradycardia. May need to resume a low dose if needed for heart rate.  4. Diabetes mellitus type 2 - closely follow CBGs with sliding-scale, hold metformin,  AIC 7.9  5. Hypertension - D/C'd Lopressor secondary to bradycardia. Blood pressure stable follow  6.  Hypokalemia-replace       -bmet in am  7.   Morbid obesity  #8 asymptomatic bradycardia Patient noted to have bradycardia in the 30s while sleeping however improve on awakening. Per nursing patient with some 2 second pauses. Bradycardia improved off Lopressor. Follow.  9.  Ileus: abd distension/bloating from last pm     -asymptomatic with no N/V or pain today     -likely from narcotics, hypokalemia     -Patient with some loose stools per nursing. KUB with improving ileus.      Cdiff negative     - Continue Mobilize. Replace electrolytes. Will try a suppository. Advance to full liquids.  DVT proph: on coumadin  Code Status: FuLL Family Communication: Updated patient at bedside Disposition Plan:home in 2-3days if ileus resolves   HPI/Subjective: Feels ok, belly feels ok, no vomiting. Patient with no significant bowel movement. Patient states saw lots of liquid. Patient tolerated clears. Objective: Filed Vitals:   06/30/13 0438  BP: 114/65  Pulse: 72  Temp: 97.7 F (36.5 C)  Resp: 18    Intake/Output Summary (Last 24 hours) at 06/30/13 1041 Last data filed at 06/30/13 0923  Gross per 24 hour  Intake    750 ml  Output   2600 ml  Net  -1850 ml   Filed Weights   06/28/13 0551 06/29/13 0448 06/30/13 0755  Weight: 162.206 kg (357 lb 9.6 oz) 159.8 kg (352 lb 4.7 oz) 157.852 kg (348 lb)    Exam:   General: AAOx3, morbidly obese  Cardiovascular: S1S2/RRR  Respiratory:CTAB  Abdomen:  Nt, BS present, distended mildly tight  Musculoskeletal: erythema and tenderness of RLE tracking up medical thigh  2 plus edema with chronic venous stasis changes superimposed  Data Reviewed: Basic Metabolic Panel:  Recent Labs Lab 06/26/13 0433 06/27/13 0420 06/28/13 0513 06/29/13 0420 06/29/13 1652 06/30/13 0410  NA 131* 131* 135 138 136 136  K 2.9*  3.5 4.2 3.4* 3.2* 2.8*  CL 94* 97 98 99 99 97  CO2 26 22 29 30 27 29   GLUCOSE 209* 220* 180* 170* 153* 146*  BUN 12 16 16 16 15 13   CREATININE 1.05 1.20 1.33 1.27 1.24 1.20  CALCIUM 8.3* 8.7 8.9 9.0 8.4 8.5  MG  --  1.8 2.2  --   --  2.0   Liver Function Tests:  Recent Labs Lab 06/23/13 1848 06/24/13 0410  AST 47* 32  ALT 26 21  ALKPHOS 58 46  BILITOT 1.4* 1.3*  PROT 7.4 6.2  ALBUMIN  3.1* 2.7*   No results found for this basename: LIPASE, AMYLASE,  in the last 168 hours No results found for this basename: AMMONIA,  in the last 168 hours CBC:  Recent Labs Lab 06/23/13 1848 06/24/13 0410 06/25/13 0340 06/26/13 0433 06/27/13 0420 06/28/13 0513 06/30/13 0410  WBC 22.9* 18.7* 11.1* 9.6 12.0* 10.3 8.0  NEUTROABS 21.3* 17.0*  --   --   --   --   --   HGB 12.0* 11.4* 11.8* 12.8* 13.1 13.2 12.6*  HCT 36.6* 34.2* 34.8* 38.4* 38.4* 38.7* 37.3*  MCV 96.8 97.4 95.9 95.3 94.6 94.4 93.5  PLT 192 170 147* 193 197 221 260   Cardiac Enzymes:  Recent Labs Lab 06/23/13 1848 06/24/13 0410  TROPONINI <0.30 <0.30   BNP (last 3 results)  Recent Labs  06/23/13 1832  PROBNP 2292.0*   CBG:  Recent Labs Lab 06/29/13 0749 06/29/13 1159 06/29/13 1616 06/29/13 2056 06/30/13 0808  GLUCAP 146* 154* 133* 177* 168*    Recent Results (from the past 240 hour(s))  CULTURE, BLOOD (ROUTINE X 2)     Status: None   Collection Time    06/23/13  8:45 PM      Result Value Range Status   Specimen Description BLOOD RIGHT ARM   Final   Special Requests BOTTLES DRAWN AEROBIC AND ANAEROBIC 5CC   Final   Culture  Setup Time     Final   Value: 06/24/2013 02:51     Performed at [facility]   Culture     Final   Value:        BLOOD CULTURE RECEIVED NO GROWTH TO DATE CULTURE WILL BE HELD FOR 5 DAYS BEFORE ISSUING A FINAL NEGATIVE REPORT     Performed at [facility]   Report Status PENDING   Incomplete  URINE CULTURE     Status: None   Collection Time    06/23/13  8:51 PM      Result Value Range Status   Specimen Description URINE, CLEAN CATCH   Final   Special Requests NONE   Final   Culture  Setup Time     Final   Value: 06/24/2013 03:32     Performed at [facility] Count     Final   Value: NO GROWTH     Performed at [facility]   Culture     Final   Value: NO GROWTH     Performed at [facility]   Report Status  06/25/2013 FINAL   Final  CLOSTRIDIUM DIFFICILE BY PCR     Status: None   Collection Time    06/24/13  1:44 AM      Result Value Range Status   C difficile by pcr NEGATIVE  NEGATIVE Final   Comment: Performed at [facility]     Studies: Dg Abd  1 View  06/30/2013   *RADIOLOGY REPORT*  Clinical Data: Follow up ileus  ABDOMEN - 1 VIEW  Comparison: Prior radiograph 06/29/2013  Findings: Slightly decreased gaseous distension of small bowel and colon throughout the abdomen.  Maximal small by that small bowel diameter measures 7.3 cm compared to 7.8 cm.  No large free air on the supine radiographs.  No acute osseous abnormality.  IMPRESSION:  Slightly improved ileus pattern.  Maximal small bowel diameter measures 7.3 cm today compared to 7.8 cm yesterday.   Original Report Authenticated By: Malachy Moan, M.D.   Dg Abd 1 View  06/29/2013   *RADIOLOGY REPORT*  Clinical Data: Follow up ileus  ABDOMEN - 1 VIEW  Comparison: 06/28/2013  Findings: Improvement in large and small bowel dilatation compared with yesterday.  Findings are most consistent with improving ileus.  Gallstones are noted.  IMPRESSION: Large and small bowel loops remain dilated but improved from the prior study.  Gallstones   Original Report Authenticated By: Janeece Riggers, M.D.    Scheduled Meds: .  ceFAZolin (ANCEF) IV  1 g Intravenous Q8H  . furosemide  80 mg Intravenous BID  . insulin aspart  0-15 Units Subcutaneous TID WC  . losartan  50 mg Oral q morning - 10a  . pantoprazole (PROTONIX) IV  40 mg Intravenous Q24H  . potassium chloride  10 mEq Intravenous Q1 Hr x 4  . potassium chloride  40 mEq Oral Q4H  . simethicone  160 mg Oral QID  . sodium chloride  3 mL Intravenous Q12H  . sodium chloride  3 mL Intravenous Q12H  . warfarin  5 mg Oral ONCE-1800  . Warfarin - Pharmacist Dosing Inpatient   Does not apply q1800   Continuous Infusions:   Principal Problem:   Adynamic ileus Active Problems:   Fever    Cellulitis   CHF (congestive heart failure)   Atrial fibrillation   Diarrhea   HTN (hypertension)   Diabetes mellitus   Hyperlipidemia    Time spent:    [facility]  [facility] Pager [phone]. If 7PM-7AM, please contact night-coverage at [email or web address], password [facility] 06/30/2013, 10:41 AM  LOS: 7 days

## 2013-06-30 NOTE — Progress Notes (Signed)
ANTICOAGULATION CONSULT NOTE  Pharmacy Consult for Lovenox, Warfarin Indication: atrial fibrillation  Allergies  Allergen Reactions  . Lisinopril Swelling    Tongue swelling    Patient Measurements: Height: 5\' 11"  (180.3 cm) Weight: 348 lb (157.852 kg) IBW/kg (Calculated) : 75.3  Vital Signs: Temp: 97.7 F (36.5 C) (08/11 0438) Temp src: Oral (08/11 0438) BP: 114/65 mmHg (08/11 0438) Pulse Rate: 72 (08/11 0438)  Labs:  Recent Labs  06/28/13 0513 06/29/13 0420 06/29/13 1652 06/30/13 0410  HGB 13.2  --   --  12.6*  HCT 38.7*  --   --  37.3*  PLT 221  --   --  260  LABPROT 28.3* 31.5*  --  28.3*  INR 2.77* 3.19*  --  2.77*  CREATININE 1.33 1.27 1.24 1.20    Estimated Creatinine Clearance: 86.5 ml/min (by C-G formula based on Cr of 1.2).  Medications:  Scheduled:  .  ceFAZolin (ANCEF) IV  1 g Intravenous Q8H  . furosemide  80 mg Intravenous BID  . insulin aspart  0-15 Units Subcutaneous TID WC  . losartan  50 mg Oral q morning - 10a  . pantoprazole (PROTONIX) IV  40 mg Intravenous Q24H  . potassium chloride  10 mEq Intravenous Q1 Hr x 4  . potassium chloride  40 mEq Oral Q4H  . simethicone  160 mg Oral QID  . sodium chloride  3 mL Intravenous Q12H  . sodium chloride  3 mL Intravenous Q12H  . Warfarin - Pharmacist Dosing Inpatient   Does not apply q1800   Infusions:    Assessment: 72 yo M admitted 06/23/13, on chronic anticoagulation with warfarin for A-fib. Pharmacy asked to dose warfarin inpatient.  Patient started on Lovenox bridge on 8/6 d/t subtherapeutic INR and active Afib; Lovenox d/c'd 8/9 when INR was therapeutic. INR now back in goal range today after holding yesterday's dose.  Home warfarin dose reported as 5mg  MWFSun and 2.5mg  TuThSat Patient has been receiving larger warfarin doses than usual in hospital due to subtherapeutic INR on admission (7.5mg  and 10mg  doses) H/H, plts ok  Goal of Therapy:  INR 2-3 Monitor platelets by anticoagulation  protocol: Yes   Plan:   Warfarin 5mg  PO x1 at 18:00   Daily PT/INR  Darrol Angel, PharmD Pager: [phone] 8:16 AM 06/30/2013

## 2013-06-30 NOTE — Progress Notes (Signed)
PT Cancellation Note  Patient Details Name: Edwin [name] MRN: [id number] DOB: November 19, 1941   Cancelled Treatment:    Reason Eval/Treat Not Completed: Pain limiting ability to participate-pt states his stomach hurts and he feels sick-declines to participate at this time. Will check back another time. Thanks.    Rebeca Alert, MPT Pager: [phone]

## 2013-07-01 ENCOUNTER — Inpatient Hospital Stay (HOSPITAL_COMMUNITY): Payer: MEDICARE

## 2013-07-01 DIAGNOSIS — K56609 Unspecified intestinal obstruction, unspecified as to partial versus complete obstruction: Secondary | ICD-10-CM

## 2013-07-01 DIAGNOSIS — R933 Abnormal findings on diagnostic imaging of other parts of digestive tract: Secondary | ICD-10-CM | POA: Diagnosis present

## 2013-07-01 DIAGNOSIS — N179 Acute kidney failure, unspecified: Secondary | ICD-10-CM

## 2013-07-01 LAB — BASIC METABOLIC PANEL
Chloride: 93 mEq/L — ABNORMAL LOW (ref 96–112)
GFR calc Af Amer: 38 mL/min — ABNORMAL LOW (ref >90)
Potassium: 3.4 mEq/L — ABNORMAL LOW (ref 3.5–5.1)
Sodium: 130 mEq/L — ABNORMAL LOW (ref 135–145)

## 2013-07-01 LAB — GLUCOSE, CAPILLARY
Glucose-Capillary: 218 mg/dL — ABNORMAL HIGH (ref 70–99)
Glucose-Capillary: 224 mg/dL — ABNORMAL HIGH (ref 70–99)
Glucose-Capillary: 157 mg/dL — ABNORMAL HIGH (ref 70–99)

## 2013-07-01 LAB — BASIC METABOLIC PANEL WITH GFR
BUN: 26 mg/dL — ABNORMAL HIGH (ref 6–23)
CO2: 23 meq/L (ref 19–32)
Calcium: 8.6 mg/dL (ref 8.4–10.5)
Chloride: 93 meq/L — ABNORMAL LOW (ref 96–112)
Creatinine, Ser: 3.09 mg/dL — ABNORMAL HIGH (ref 0.50–1.35)
GFR calc Af Amer: 22 mL/min — ABNORMAL LOW
GFR calc non Af Amer: 19 mL/min — ABNORMAL LOW
Glucose, Bld: 194 mg/dL — ABNORMAL HIGH (ref 70–99)
Potassium: 3.5 meq/L (ref 3.5–5.1)
Sodium: 131 meq/L — ABNORMAL LOW (ref 135–145)

## 2013-07-01 LAB — PROTIME-INR
INR: 2.59 — ABNORMAL HIGH (ref 0.00–1.49)
Prothrombin Time: 26.9 s — ABNORMAL HIGH (ref 11.6–15.2)

## 2013-07-01 MED ORDER — SODIUM CHLORIDE 0.9 % IV SOLN
INTRAVENOUS | Status: DC
  Administered 2013-07-01 – 2013-07-02 (×2): via INTRAVENOUS

## 2013-07-01 MED ORDER — METOCLOPRAMIDE HCL 5 MG/ML IJ SOLN
5.0000 mg | Freq: Four times a day (QID) | INTRAMUSCULAR | Status: DC
  Administered 2013-07-01: 13:00:00 via INTRAVENOUS
  Administered 2013-07-01 – 2013-07-02 (×3): 5 mg via INTRAVENOUS
  Filled 2013-07-01: qty 2
  Filled 2013-07-01 (×2): qty 1
  Filled 2013-07-01 (×2): qty 2
  Filled 2013-07-01 (×2): qty 1
  Filled 2013-07-01: qty 2

## 2013-07-01 MED ORDER — WARFARIN SODIUM 5 MG PO TABS
5.0000 mg | ORAL_TABLET | Freq: Once | ORAL | Status: AC
  Administered 2013-07-01: 5 mg via ORAL
  Filled 2013-07-01: qty 1

## 2013-07-01 NOTE — Consult Note (Signed)
[facility] Consultation  Referring Provider:    [facility]  Primary Care Physician:  Aura Dials, MD Primary Gastroenterologist:  none       Reason for Consultation:    ileus          HPI:   Edwin [name] is a 72 y.o. male with multiple medical problems including, but not limited to morbid obesity, diabetes, atrial fibrillation, and chronic heart failure. He is on chronic coumadin at home. Patient admitted several days ago with fever, chills, SOB. His WBC was elevated at 22.9. He was diagnosed with RLE cellulitis and started on antibiotics. WBC now normal. Patient reported diarrhea prior to admission, C-diff is negative. Abdominal exam in ED was negative. Aj couple of days into admission was found to be distended. Abdominal films suggested diffuse ileus with large and small bowel dilation. No significant improvement in abdominal distention despite ambulation, electrolyte repletion and avoidance of narcotics.   Patient was in normal state of health until Sunday 06/22/13 when he developed  this described abdominal discomfort and fullness. He has associated nausea without vomiting and is having frequent belching but little flatus. Patient does recall similar episode many years ago at which time he was hospitalized but nothing found. Between then and now he had another episode which resolved at home within a few days. No history of abdominal surgeries.   Patient denies any chronic GI problems. He has a remote history of GERD but has been asymptomatic for years.  No history of PUD. Patient has never had a colonoscopy or seen GI  Past Medical History  Diagnosis Date  . Hypertension     benign essential hypertension  . Diabetes mellitus     NIDDM  . Morbid obesity   . Pure hypercholesterolemia   . Long-term (current) use of anticoagulants   . Varicose veins of legs   . Pulmonary hypertension MODERATE  . Cor pulmonale, chronic   . Chronic right-sided CHF (congestive heart  failure)   . Atrial fibrillation CARDIOLOGIST-- DR Jacinto Halim  . Anxiety   . Shortness of breath     with exertion   . Arthritis     Past Surgical History  Procedure Laterality Date  . Cardiovascular stress test  07-19-2012  DR Jacinto Halim    PROMINENT DIAPHRAGMATIC ATTENUATION/ NORMAL LVEF/ LOW RISK STUDY  . Transthoracic echocardiogram  05-16-2012    LOW NORMAL LVEF/ MOD. RV/ MILD HYPOKINESIS/ MOD. PULMONARY HTN/ CHRONIC COR PUMONALE/ NO SIG. CHANGE FROM 12-01-2010  . Mouth surgery    . Benign mole removal from nose     . Knee arthroscopy with medial menisectomy Left 04/23/2013    Procedure: LEFT KNEE ARTHROSCOPY WITH PARTIAL MEDIAL MENISECTOMY;  Surgeon: Drucilla Schmidt, MD;  Location: [facility];  Service: Orthopedics;  Laterality: Left;    Family History  Problem Relation Age of Onset  . Cancer Paternal Grandfather   . Hypertension Paternal Grandfather   . Stroke Paternal Grandfather    Colon cancer in mother  History  Substance Use Topics  . Smoking status: Former Smoker -- 1.00 packs/day    Types: Cigarettes    Quit date: 06/27/1984  . Smokeless tobacco: Never Used  . Alcohol Use: Yes     Comment: 28 drinks of vodka per week     Prior to Admission medications   Medication Sig Start Date End Date Taking? Authorizing Provider  furosemide (LASIX) 40 MG tablet Take 80 mg by mouth 2 (two) times daily.    Yes Historical Provider,  MD  losartan (COZAAR) 50 MG tablet Take 50 mg by mouth every morning.    Yes Historical Provider, MD  metFORMIN (GLUCOPHAGE) 500 MG tablet Take 500 mg by mouth daily after supper.    Yes Historical Provider, MD  metoprolol succinate (TOPROL-XL) 25 MG 24 hr tablet Take 12.5 mg by mouth 2 (two) times daily.    Yes Historical Provider, MD  potassium chloride SA (K-DUR,KLOR-CON) 20 MEQ tablet Take 20 mEq by mouth every morning.    Yes Historical Provider, MD  pravastatin (PRAVACHOL) 20 MG tablet Take 20 mg by mouth daily after supper.    Yes Historical Provider, MD   spironolactone (ALDACTONE) 25 MG tablet Take 25 mg by mouth daily as needed (For fluid retention.).    Yes Historical Provider, MD  warfarin (COUMADIN) 5 MG tablet Take 2.5-5 mg by mouth 2 (two) times daily. Take 2.5 mg daily for tues, thurs,sat   And  5mg  sun, mon,wed,fri   Yes Historical Provider, MD  acetaminophen (TYLENOL) 160 MG/5ML liquid Take 15 mg/kg by mouth every 4 (four) hours as needed for fever.    Historical Provider, MD  acetaminophen (TYLENOL) 325 MG tablet Take 325 mg by mouth every 6 (six) hours as needed for pain.    Historical Provider, MD  HYDROcodone-acetaminophen (NORCO) 10-325 MG per tablet Take 1 tablet by mouth every 6 (six) hours as needed for pain. 04/23/13   Illene Labrador Aplington, MD  sildenafil (VIAGRA) 100 MG tablet Take 100 mg by mouth daily as needed for erectile dysfunction.     Historical Provider, MD    Current Facility-Administered Medications  Medication Dose Route Frequency Provider Last Rate Last Dose  . 0.9 %  sodium chloride infusion   Intravenous Continuous Rodolph Bong, MD 75 mL/hr at 07/01/13 1037    . acetaminophen (TYLENOL) tablet 650 mg  650 mg Oral Q6H PRN Eduard Clos, MD   650 mg at 07/01/13 1033   Or  . acetaminophen (TYLENOL) suppository 650 mg  650 mg Rectal Q6H PRN Eduard Clos, MD      . albuterol (PROVENTIL) (5 MG/ML) 0.5% nebulizer solution 2.5 mg  2.5 mg Nebulization Q4H PRN Zannie Cove, MD   2.5 mg at 06/24/13 1712  . alum & mag hydroxide-simeth (MAALOX/MYLANTA) 200-200-20 MG/5ML suspension 15 mL  15 mL Oral Q4H PRN Zannie Cove, MD   15 mL at 06/28/13 1520  . bisacodyl (DULCOLAX) suppository 10 mg  10 mg Rectal Daily Rodolph Bong, MD   10 mg at 07/01/13 1033  . ceFAZolin (ANCEF) IVPB 1 g/50 mL premix  1 g Intravenous Q8H Rodolph Bong, MD   1 g at 07/01/13 0530  . HYDROcodone-acetaminophen (NORCO) 10-325 MG per tablet 1 tablet  1 tablet Oral Q6H PRN Eduard Clos, MD   1 tablet at 07/01/13 1158  .  insulin aspart (novoLOG) injection 0-15 Units  0-15 Units Subcutaneous TID WC Zannie Cove, MD   5 Units at 07/01/13 [phone]  . metoCLOPramide (REGLAN) injection 5 mg  5 mg Intravenous Q6H Rodolph Bong, MD      . ondansetron [facility]) tablet 4 mg  4 mg Oral Q6H PRN Eduard Clos, MD   4 mg at 06/30/13 2124   Or  . ondansetron (ZOFRAN) injection 4 mg  4 mg Intravenous Q6H PRN Eduard Clos, MD   4 mg at 07/01/13 0446  . pantoprazole (PROTONIX) injection 40 mg  40 mg Intravenous Q24H Zannie Cove, MD  40 mg at 06/30/13 1657  . simethicone (MYLICON) chewable tablet 160 mg  160 mg Oral QID Rodolph Bong, MD   160 mg at 07/01/13 1033  . sodium chloride 0.9 % injection 3 mL  3 mL Intravenous Q12H Eduard Clos, MD      . sodium chloride 0.9 % injection 3 mL  3 mL Intravenous Q12H Eduard Clos, MD   3 mL at 06/29/13 0930  . warfarin (COUMADIN) tablet 5 mg  5 mg Oral ONCE-1800 Annia Belt, [facility]      . Warfarin - Pharmacist Dosing Inpatient   Does not apply q1800 Lorenza Evangelist, RPH      . zolpidem (AMBIEN) tablet 5 mg  5 mg Oral QHS PRN Leanne Chang, NP   5 mg at 06/30/13 0030    Allergies as of 06/23/2013 - Review Complete 06/23/2013  Allergen Reaction Noted  . Lisinopril Swelling 06/22/2011   Review of Systems:    All systems reviewed and negative except where noted in HPI.   Physical Exam:  Vital signs in last 24 hours: Temp:  [97.7 F (36.5 C)-99.5 F (37.5 C)] 98.3 F (36.8 C) (08/12 0435) Pulse Rate:  [73-96] 73 (08/12 0435) Resp:  [18-20] 19 (08/12 0435) BP: (116-132)/(62-73) 121/62 mmHg (08/12 0435) SpO2:  [97 %-100 %] 100 % (08/12 0435) Weight:  [346 lb 9 oz (157.2 kg)] 346 lb 9 oz (157.2 kg) (08/12 0435) Last BM Date: 06/30/13 General:   Pleasant morbidly obese white male in NAD Head:  Normocephalic and atraumatic. Eyes:   No icterus.   Conjunctiva pink. Ears:  Normal auditory acuity. Neck:  Supple; no masses felt Lungs:   Respirations even and unlabored. Bibasilar crackles.  Heart:  Regular rate and rhythm Abdomen:  Obese, distended with tympany and hypoactive bowel sounds. Unable to adequately assess for masses given body habitus and distention. Extremities:  BLE with pitting edema, Skin thick with changes of chronic venous stasis Neurologic:  Alert and  oriented x4;  grossly normal neurologically. Skin:  Intact without significant lesions or rashes. Cervical Nodes:  No significant cervical adenopathy. Psych:  Alert and cooperative. Normal affect.  LAB RESULTS:  Recent Labs  06/30/13 0410  WBC 8.0  HGB 12.6*  HCT 37.3*  PLT 260   BMET  Recent Labs  06/29/13 1652 06/30/13 0410 07/01/13 0420  NA 136 136 130*  K 3.2* 2.8* 3.4*  CL 99 97 93*  CO2 27 29 27   GLUCOSE 153* 146* 224*  BUN 15 13 16   CREATININE 1.24 1.20 1.95*  CALCIUM 8.4 8.5 9.0   PT/INR  Recent Labs  06/30/13 0410 07/01/13 0420  LABPROT 28.3* 26.9*  INR 2.77* 2.59*    STUDIES: Dg Abd 1 View  07/01/2013   *RADIOLOGY REPORT*  Clinical Data: Abdominal pain and distention.  Abdominal ileus.  ABDOMEN - 1 VIEW  Comparison: 06/30/2013  Findings: Frontal view of the abdomen is obtained on three images due to body habitus.  Small bowel dilatation similar to prior noted, measuring up to 7.6 cm.  Calcified gallstones in the right upper quadrant measure up to 1.6 cm in diameter.  No biliary gas observed.  IMPRESSION:  1.  Nonspecific small bowel dilatation, essentially stable in maximum diameter compared to prior exam. 2.  Cholelithiasis.   Original Report Authenticated By: Gaylyn Rong, M.D.   Dg Abd 1 View  06/30/2013   *RADIOLOGY REPORT*  Clinical Data: Follow up ileus  ABDOMEN - 1 VIEW  Comparison: Prior radiograph 06/29/2013  Findings: Slightly decreased gaseous distension of small bowel and colon throughout the abdomen.  Maximal small by that small bowel diameter measures 7.3 cm compared to 7.8 cm.  No large free air on the  supine radiographs.  No acute osseous abnormality.  IMPRESSION:  Slightly improved ileus pattern.  Maximal small bowel diameter measures 7.3 cm today compared to 7.8 cm yesterday.   Original Report Authenticated By: Malachy Moan, M.D.   CTscan with contrast April 2001:  FINDINGS  CLINICAL DATA: FOLLOW-UP SMALL BOWEL OBSTRUCTION  CT OF THE ABDOMEN WITH INTRAVENOUS CONTRAST:  HELICAL SCANS WERE OBTAINED DURING INTRAVENOUS CONTRAST ENHANCEMENT WITH 150CC OMNIPAQUE 300  INJECTED AT [facility] PER SECOND.  THERE ARE MODERATELY DILATED LOOPS OF SMALL BOWEL PROXIMALLY WITHIN THE MID- AND SMALL BOWEL.  THERE IS A TRANSITION WITH NORMAL IN CALIBER DISTAL SMALL BOWEL. HOWEVER, THE ETIOLOGY FOR THE  PARTIAL SMALL BOWEL OBSTRUCTION IS UNCLEAR BY CT SCAN. THERE ARE GALLSTONES SEEN WITHIN THE  GALLBLADDER WITH A NORMAL GALLBLADDER WALL THICKNESS. THE LIVER, PANCREAS AND SPLEEN HAVE A NORMAL  APPEARANCE. THE KIDNEYS HAVE A NORMAL APPEARANCE. THE ABDOMINAL AORTA IS NORMAL IN SIZE. NO  RETROPERITONEAL ADENOPATHY.  IMPRESSION  PREVIOUS ENDOSCOPIES:            none   Impression / Plan:   68. 72 year old male with multiple medical problems as listed above. Patient admitted with lower extremity cellulitis and found to have abdominal distention / ileus on imaging studies. This may be an ileus but an extensive review of records turned up a CTscan from 2001 suggesting SBO (see above). Patient does recall being hospitalized for NGT decompression at that time.  SBO is a diagnostic consideration. Wonder about gallstone ileus? Will place NGT for decompression. Make NPO for now.  He may need eventual CT enterography for further evaluation.   2. Cholelithiasis.   3. [facility] of colon cancer in mother.   4. Chronic anticoagulation, on coumadin at home.   5. Hypokalemia, on diuretics. Hospitalist repleting. Mg+ level is okay   Thanks   LOS: 8 days   Willette Cluster  07/01/2013, 12:52 PM   Attending MD note:   I have  reviewed the above note, examined the patient and agree with plan of treatment. Small bowl obstruction, much relieved with NG suction, had instantly about 2000cc's of small bowl content in the canister, feeling better. This is a recurrent episode (2001, 2006), both episodes  Resolved on conservative measures.. I suspect mid to  distal SBO- Will look for transition point on CT enterography  when fully decompressed. Very faint bowl sounds at present. No hx of abdominal surgery- ?adhesions. R/o internal hernia, doubt Crohn's disease. Will need vigorous K+ repletion., possibly a surgical consult ( saw Dr Rayburn Ma in 2001).  [facility] Pager # [phone]

## 2013-07-01 NOTE — Progress Notes (Signed)
CSW reviewed note from Nursing Admission Summary that patient needed information about advance directives. CSW provided patient/family with Advance Directives packet.   Clinical Social Worker encouraged patient/family to contact with any additional questions or concerns.  Unice Bailey, LCSW [facility] Clinical Social Worker cell #: [phone]

## 2013-07-01 NOTE — Progress Notes (Signed)
ANTICOAGULATION CONSULT NOTE  Pharmacy Consult for Warfarin Indication: atrial fibrillation  Allergies  Allergen Reactions  . Lisinopril Swelling    Tongue swelling    Patient Measurements: Height: 5\' 11"  (180.3 cm) Weight: 346 lb 9 oz (157.2 kg) IBW/kg (Calculated) : 75.3  Vital Signs: Temp: 98.3 F (36.8 C) (08/12 0435) Temp src: Oral (08/12 0435) BP: 121/62 mmHg (08/12 0435) Pulse Rate: 73 (08/12 0435)  Labs:  Recent Labs  06/29/13 0420 06/29/13 1652 06/30/13 0410 07/01/13 0420  HGB  --   --  12.6*  --   HCT  --   --  37.3*  --   PLT  --   --  260  --   LABPROT 31.5*  --  28.3* 26.9*  INR 3.19*  --  2.77* 2.59*  CREATININE 1.27 1.24 1.20 1.95*    Estimated Creatinine Clearance: 53.1 ml/min (by C-G formula based on Cr of 1.95).  Medications:  Scheduled:  . bisacodyl  10 mg Rectal Daily  .  ceFAZolin (ANCEF) IV  1 g Intravenous Q8H  . furosemide  80 mg Intravenous BID  . insulin aspart  0-15 Units Subcutaneous TID WC  . losartan  50 mg Oral q morning - 10a  . pantoprazole (PROTONIX) IV  40 mg Intravenous Q24H  . simethicone  160 mg Oral QID  . sodium chloride  3 mL Intravenous Q12H  . sodium chloride  3 mL Intravenous Q12H  . Warfarin - Pharmacist Dosing Inpatient   Does not apply q1800   Infusions:    Assessment: 72 yo M admitted 06/23/13, on chronic anticoagulation with warfarin for A-fib. Patient started on Lovenox bridge on 8/6 d/t subtherapeutic INR and active Afib; Lovenox d/c'd 8/9 when INR was therapeutic. INR remains in goal range today after holding 8/10 dose.  Home warfarin dose reported as 5mg  MWFSun and 2.5mg  TuThSat Patient has been receiving larger warfarin doses than usual in hospital due to subtherapeutic INR on admission (7.5mg  and 10mg  doses) H/H, plts ok  Goal of Therapy:  INR 2-3 Monitor platelets by anticoagulation protocol: Yes   Plan:   Repeat warfarin 5mg  PO x1 at 18:00   Daily PT/INR  Darrol Angel, PharmD Pager:  [phone] 7:55 AM 07/01/2013

## 2013-07-01 NOTE — Progress Notes (Signed)
[facility] PROGRESS NOTE  Edwin [name] [name]:[id number] DOB: Mar 31, 1941 DOA: 06/23/2013 PCP: Aura Dials, MD Brief narrative Chief Complaint: Fever.  HPI: Edwin [name] is a 72 y.o. male history of CHF, diabetes mellitus type 2, hyperlipidemia, A. fib on Coumadin presented with complaints of having fever chills and feeling weak. In the ER patient was found to have right lower extremity erythema actually up to mid thigh. Patient was found to be febrile and lactic acid was found to be elevated with elevated leukocyte counts. Patient was initially given 2 L normal saline bolus and started on vancomycin and Zosyn for cellulitis. His R leg cellulitis has much improved, subsequently has been having issues with volume overload, currently being diuresed and today with ileus.  Assessment/Plan: 1. SIRS/Right lower extremity cellulitis.  -significant improvement now - Vancomycin discontinued after 5 days. D/C IV Ancef antibiotic day 8/7-10 -blood Cx NGTD -dopplers negative for DVT  2. CHF - acute on chronic likely diastolic - D/C IV lasix 80mg  q12, SECONDARY TO ELEVATED RENAL FUNCTION. - 2D ECHO with EF of 55-60%. No wall motion abnormalities. Left atrium is severely dilated. - unable to get acurate I/Os. Follow.  3. Atrial fibrillation - rate controlled currently. Continue Coumadin per pharmacy, INR therapeutic. Beta blocker on hold secondary to bradycardia. May need to resume a low dose if needed for heart rate.  4. Diabetes mellitus type 2 - closely follow CBGs with sliding-scale, hold metformin,  AIC 7.9  5. Hypertension - D/C'd Lopressor secondary to bradycardia. Blood pressure stable follow  6.  Hypokalemia-replace       -bmet in am  7.   Morbid obesity  #8 asymptomatic bradycardia Patient noted to have bradycardia in the 30s while sleeping however improve on awakening. Per nursing patient with some 2 second pauses. Bradycardia improved off Lopressor. Follow.  9. Ileus: abd  distension/bloating from last pm     -No emesis. Patien with nausea. ??etiology. Patient not on narcotics. Electrolytes being repleted.     -Patient with some loose stools per nursing. KUB with SB dilatation unchanged.       Cdiff negative     - Continue Mobilize. Replace electrolytes. Continue suppository. Clears. GI consult for further eval and rxcs.   10. ARF Likely secondary to diuretics in setting of ARB. Hold ARB. Urine electrolytes. Gentle hydration and follow  DVT proph: on coumadin  Code Status: FuLL Family Communication: Updated patient at bedside Disposition Plan:home in 2-3days if ileus resolves   HPI/Subjective: Feels ok, belly feels ok, no vomiting. Patient with nausea overnight. Patient states saw lots of liquid. Patient tolerated clears. Nausea with full liquids. Objective: Filed Vitals:   07/01/13 0435  BP: 121/62  Pulse: 73  Temp: 98.3 F (36.8 C)  Resp: 19    Intake/Output Summary (Last 24 hours) at 07/01/13 0755 Last data filed at 07/01/13 7829  Gross per 24 hour  Intake   1030 ml  Output   2600 ml  Net  -1570 ml   Filed Weights   06/29/13 0448 06/30/13 0755 07/01/13 0435  Weight: 159.8 kg (352 lb 4.7 oz) 157.852 kg (348 lb) 157.2 kg (346 lb 9 oz)    Exam:   General: AAOx3, morbidly obese  Cardiovascular: S1S2/RRR  Respiratory:CTAB  Abdomen:  Nt, decreased BS present, distended mildly tight  Musculoskeletal: erythema and tenderness of RLE tracking up medical thigh  2 plus edema with chronic venous stasis changes superimposed  Data Reviewed: Basic Metabolic Panel:  Recent Labs Lab 06/26/13 0433 06/27/13  1610 06/28/13 0513 06/29/13 0420 06/29/13 1652 06/30/13 0410 07/01/13 0420  NA 131* 131* 135 138 136 136 130*  K 2.9* 3.5 4.2 3.4* 3.2* 2.8* 3.4*  CL 94* 97 98 99 99 97 93*  CO2 26 22 29 30 27 29 27   GLUCOSE 209* 220* 180* 170* 153* 146* 224*  BUN 12 16 16 16 15 13 16   CREATININE 1.05 1.20 1.33 1.27 1.24 1.20 1.95*  CALCIUM  8.3* 8.7 8.9 9.0 8.4 8.5 9.0  MG  --  1.8 2.2  --   --  2.0  --    Liver Function Tests: No results found for this basename: AST, ALT, ALKPHOS, BILITOT, PROT, ALBUMIN,  in the last 168 hours No results found for this basename: LIPASE, AMYLASE,  in the last 168 hours No results found for this basename: AMMONIA,  in the last 168 hours CBC:  Recent Labs Lab 06/25/13 0340 06/26/13 0433 06/27/13 0420 06/28/13 0513 06/30/13 0410  WBC 11.1* 9.6 12.0* 10.3 8.0  HGB 11.8* 12.8* 13.1 13.2 12.6*  HCT 34.8* 38.4* 38.4* 38.7* 37.3*  MCV 95.9 95.3 94.6 94.4 93.5  PLT 147* 193 197 221 260   Cardiac Enzymes: No results found for this basename: CKTOTAL, CKMB, CKMBINDEX, TROPONINI,  in the last 168 hours BNP (last 3 results)  Recent Labs  06/23/13 1832  PROBNP 2292.0*   CBG:  Recent Labs Lab 06/29/13 2056 06/30/13 0808 06/30/13 1158 06/30/13 1642 06/30/13 2133  GLUCAP 177* 168* 186* 171* 214*    Recent Results (from the past 240 hour(s))  CULTURE, BLOOD (ROUTINE X 2)     Status: None   Collection Time    06/23/13  8:45 PM      Result Value Range Status   Specimen Description BLOOD RIGHT ARM   Final   Special Requests BOTTLES DRAWN AEROBIC AND ANAEROBIC 5CC   Final   Culture  Setup Time     Final   Value: 06/24/2013 02:51     Performed at [facility]   Culture     Final   Value: NO GROWTH 5 DAYS     Performed at [facility]   Report Status 06/30/2013 FINAL   Final  URINE CULTURE     Status: None   Collection Time    06/23/13  8:51 PM      Result Value Range Status   Specimen Description URINE, CLEAN CATCH   Final   Special Requests NONE   Final   Culture  Setup Time     Final   Value: 06/24/2013 03:32     Performed at [facility] Count     Final   Value: NO GROWTH     Performed at [facility]   Culture     Final   Value: NO GROWTH     Performed at [facility]   Report Status 06/25/2013 FINAL   Final  CULTURE,  BLOOD (ROUTINE X 2)     Status: None   Collection Time    06/23/13  9:52 PM      Result Value Range Status   Specimen Description BLOOD RIGHT ANTECUBITAL   Final   Special Requests BOTTLES DRAWN AEROBIC AND ANAEROBIC 3CC   Final   Culture  Setup Time     Final   Value: 06/24/2013 03:45     Performed at [facility]   Culture     Final   Value: NO  GROWTH 5 DAYS     Performed at [facility]   Report Status 06/30/2013 FINAL   Final  CLOSTRIDIUM DIFFICILE BY PCR     Status: None   Collection Time    06/24/13  1:44 AM      Result Value Range Status   C difficile by pcr NEGATIVE  NEGATIVE Final   Comment: Performed at [facility]     Studies: Dg Abd 1 View  06/30/2013   *RADIOLOGY REPORT*  Clinical Data: Follow up ileus  ABDOMEN - 1 VIEW  Comparison: Prior radiograph 06/29/2013  Findings: Slightly decreased gaseous distension of small bowel and colon throughout the abdomen.  Maximal small by that small bowel diameter measures 7.3 cm compared to 7.8 cm.  No large free air on the supine radiographs.  No acute osseous abnormality.  IMPRESSION:  Slightly improved ileus pattern.  Maximal small bowel diameter measures 7.3 cm today compared to 7.8 cm yesterday.   Original Report Authenticated By: Malachy Moan, M.D.   Dg Abd 1 View  06/29/2013   *RADIOLOGY REPORT*  Clinical Data: Follow up ileus  ABDOMEN - 1 VIEW  Comparison: 06/28/2013  Findings: Improvement in large and small bowel dilatation compared with yesterday.  Findings are most consistent with improving ileus.  Gallstones are noted.  IMPRESSION: Large and small bowel loops remain dilated but improved from the prior study.  Gallstones   Original Report Authenticated By: Janeece Riggers, M.D.    Scheduled Meds: . bisacodyl  10 mg Rectal Daily  .  ceFAZolin (ANCEF) IV  1 g Intravenous Q8H  . furosemide  80 mg Intravenous BID  . insulin aspart  0-15 Units Subcutaneous TID WC  . losartan  50 mg Oral q morning - 10a   . pantoprazole (PROTONIX) IV  40 mg Intravenous Q24H  . simethicone  160 mg Oral QID  . sodium chloride  3 mL Intravenous Q12H  . sodium chloride  3 mL Intravenous Q12H  . Warfarin - Pharmacist Dosing Inpatient   Does not apply q1800   Continuous Infusions:   Principal Problem:   Adynamic ileus Active Problems:   Fever   Cellulitis   CHF (congestive heart failure)   Atrial fibrillation   Diarrhea   HTN (hypertension)   Diabetes mellitus   Hyperlipidemia    Time spent:    [facility]  [facility] Pager [phone]. If 7PM-7AM, please contact night-coverage at [email or web address], password [facility] 07/01/2013, 7:55 AM  LOS: 8 days

## 2013-07-02 ENCOUNTER — Ambulatory Visit (HOSPITAL_COMMUNITY): Payer: MEDICARE

## 2013-07-02 ENCOUNTER — Encounter (HOSPITAL_COMMUNITY): Payer: Self-pay | Admitting: General Surgery

## 2013-07-02 ENCOUNTER — Inpatient Hospital Stay (HOSPITAL_COMMUNITY): Payer: MEDICARE

## 2013-07-02 DIAGNOSIS — R142 Eructation: Secondary | ICD-10-CM

## 2013-07-02 DIAGNOSIS — R143 Flatulence: Secondary | ICD-10-CM

## 2013-07-02 DIAGNOSIS — R141 Gas pain: Secondary | ICD-10-CM

## 2013-07-02 LAB — BASIC METABOLIC PANEL
BUN: 33 mg/dL — ABNORMAL HIGH (ref 6–23)
CO2: 25 mEq/L (ref 19–32)
Chloride: 94 mEq/L — ABNORMAL LOW (ref 96–112)
Creatinine, Ser: 3.06 mg/dL — ABNORMAL HIGH (ref 0.50–1.35)
Potassium: 3.1 mEq/L — ABNORMAL LOW (ref 3.5–5.1)

## 2013-07-02 LAB — SODIUM, URINE, RANDOM

## 2013-07-02 LAB — PROTIME-INR: INR: 3.42 — ABNORMAL HIGH (ref 0.00–1.49)

## 2013-07-02 LAB — GLUCOSE, CAPILLARY
Glucose-Capillary: 183 mg/dL — ABNORMAL HIGH (ref 70–99)
Glucose-Capillary: 154 mg/dL — ABNORMAL HIGH (ref 70–99)
Glucose-Capillary: 137 mg/dL — ABNORMAL HIGH (ref 70–99)

## 2013-07-02 MED ORDER — LORAZEPAM 2 MG/ML IJ SOLN
0.5000 mg | INTRAMUSCULAR | Status: AC
  Administered 2013-07-02: 0.5 mg via INTRAVENOUS
  Filled 2013-07-02: qty 1

## 2013-07-02 MED ORDER — POTASSIUM CHLORIDE 10 MEQ/100ML IV SOLN
10.0000 meq | INTRAVENOUS | Status: AC
  Administered 2013-07-02 (×3): 10 meq via INTRAVENOUS
  Filled 2013-07-02 (×3): qty 100

## 2013-07-02 MED ORDER — SODIUM CHLORIDE 0.9 % IV BOLUS (SEPSIS)
500.0000 mL | Freq: Once | INTRAVENOUS | Status: AC
  Administered 2013-07-02: 500 mL via INTRAVENOUS

## 2013-07-02 MED ORDER — POTASSIUM CHLORIDE IN NACL 20-0.9 MEQ/L-% IV SOLN
INTRAVENOUS | Status: DC
  Administered 2013-07-02 – 2013-07-07 (×8): via INTRAVENOUS
  Filled 2013-07-02 (×14): qty 1000

## 2013-07-02 MED ORDER — IOHEXOL 300 MG/ML  SOLN
25.0000 mL | INTRAMUSCULAR | Status: AC
  Administered 2013-07-02: 25 mL via INTRAVENOUS

## 2013-07-02 NOTE — Progress Notes (Signed)
Inpatient Diabetes Program Recommendations  AACE/ADA: New Consensus Statement on Inpatient Glycemic Control (2013)  Target Ranges:  Prepandial:   less than 140 mg/dL      Peak postprandial:   less than 180 mg/dL (1-2 hours)      Critically ill patients:  140 - 180 mg/dL   Reason for Visit: Hyperglycemia  Results for EBER, FERRUFINO (MRN [id number]) as of 07/02/2013 10:51  Ref. Range 07/01/2013 07:48 07/01/2013 11:52 07/01/2013 17:08 07/01/2013 21:25 07/02/2013 07:40  Glucose-Capillary Latest Range: 70-99 mg/dL 409 (H) 811 (H) 914 (H) 157 (H) 183 (H)   Uncontrolled blood sugars at present.  Consider changing Novolog to Q4 hours. May benefit from addition of Lantus 10 units QHS.  Will continue to follow.  Thank you. Ailene Ards, RD, LDN, CDE Inpatient Diabetes Coordinator [phone]

## 2013-07-02 NOTE — Progress Notes (Signed)
[facility] PROGRESS NOTE  Edwin [name] ZOX:[id number] DOB: 22-Feb-1941 DOA: 06/23/2013 PCP: Aura Dials, MD  HPI: Edwin [name] is a 72 y.o. male history of CHF, diabetes mellitus type 2, hyperlipidemia, A. fib on Coumadin presented with complaints of having fever chills and feeling weak. In the ER patient was found to have right lower extremity erythema actually up to mid thigh. Patient was found to be febrile and lactic acid was found to be elevated with elevated leukocyte counts. Patient was initially given 2 L normal saline bolus and started on vancomycin and Zosyn for cellulitis.  His R leg cellulitis has much improved, subsequently has been having issues with volume overload, currently being diuresed and today with ileus.  Assessment/Plan: SIRS/Right lower extremity cellulitis.  - Significant improvement now. S/p Vancomycin for 5 days, now on Ancef, day 9/10. Blood Cx NGTD  - dopplers negative for DVT  AKI - may be multifactorial, can be due to diuresis superimposed on significant GI losses with 6L output from NG tube. - d/c lasix and provide supportive hydration CHF - acute on chronic likely diastolic - D/C IV lasix 80mg  q12, - 2D ECHO with EF of 55-60%. No wall motion abnormalities. Left atrium is severely dilated.  - unable to get acurate I/Os. Follow.  Atrial fibrillation  - rate controlled currently. Continue Coumadin per pharmacy, INR therapeutic. Beta blocker on hold secondary to bradycardia. May need to resume a low dose if needed for heart rate Diabetes mellitus type 2  - closely follow CBGs with sliding-scale, hold metformin, AIC 7.9 Hypertension  - D/C'd Lopressor secondary to bradycardia. Blood pressure stable follow Hypokalemia-replace  - bmet in am  Morbid obesity  Asymptomatic bradycardia  - Patient noted to have bradycardia in the 30s while sleeping however improve on awakening. Per nursing patient with some 2 second pauses. Bradycardia improved off  Lopressor. Follow.  SBP: abd distension/bloating, now improved s/p NG tube - No emesis. - GI/Surgery following.   Code Status: Full Family Communication: none  Disposition Plan: home when medically ready  Consultants:  GI  Surgery  Procedures:  none  Anti-infectives   Start     Dose/Rate Route Frequency Ordered Stop   06/28/13 1600  ceFAZolin (ANCEF) IVPB 1 g/50 mL premix     1 g 100 mL/hr over 30 Minutes Intravenous 3 times per day 06/28/13 1548     06/24/13 1000  vancomycin (VANCOCIN) 1,500 mg in sodium chloride 0.9 % 500 mL IVPB  Status:  Discontinued     1,500 mg 250 mL/hr over 120 Minutes Intravenous Every 12 hours 06/23/13 1959 06/28/13 1547   06/24/13 0600  piperacillin-tazobactam (ZOSYN) IVPB 3.375 g  Status:  Discontinued     3.375 g 12.5 mL/hr over 240 Minutes Intravenous 3 times per day 06/23/13 1949 06/26/13 0830   06/23/13 2200  vancomycin (VANCOCIN) 2,000 mg in sodium chloride 0.9 % 500 mL IVPB     2,000 mg 250 mL/hr over 120 Minutes Intravenous  Once 06/23/13 1959 06/24/13 0005   06/23/13 2000  piperacillin-tazobactam (ZOSYN) IVPB 3.375 g     3.375 g 100 mL/hr over 30 Minutes Intravenous  Once 06/23/13 1949 06/23/13 2322     Antibiotics Given (last 72 hours)   Date/Time Action Medication Dose Rate   06/29/13 2204 Given   ceFAZolin (ANCEF) IVPB 1 g/50 mL premix 1 g 100 mL/hr   06/30/13 0536 Given   ceFAZolin (ANCEF) IVPB 1 g/50 mL premix 1 g 100 mL/hr   06/30/13 1430 Given  ceFAZolin (ANCEF) IVPB 1 g/50 mL premix 1 g 100 mL/hr   06/30/13 2124 Given   ceFAZolin (ANCEF) IVPB 1 g/50 mL premix 1 g 100 mL/hr   07/01/13 0530 Given   ceFAZolin (ANCEF) IVPB 1 g/50 mL premix 1 g 100 mL/hr   07/01/13 1617 Given   ceFAZolin (ANCEF) IVPB 1 g/50 mL premix 1 g 100 mL/hr   07/01/13 2121 Given   ceFAZolin (ANCEF) IVPB 1 g/50 mL premix 1 g 100 mL/hr   07/02/13 0546 Given   ceFAZolin (ANCEF) IVPB 1 g/50 mL premix 1 g 100 mL/hr     HPI/Subjective: - no  complaints, feels better  Objective: Filed Vitals:   07/01/13 2352 07/02/13 0244 07/02/13 0410 07/02/13 0500  BP: 90/52 89/47 89/50    Pulse: 84 84 91   Temp:   98.2 F (36.8 C)   TempSrc:   Oral   Resp:   19   Height:      Weight:    154.7 kg (341 lb 0.8 oz)  SpO2:   96%     Intake/Output Summary (Last 24 hours) at 07/02/13 1445 Last data filed at 07/02/13 0700  Gross per 24 hour  Intake 2353.75 ml  Output   6225 ml  Net -3871.25 ml   Filed Weights   06/30/13 0755 07/01/13 0435 07/02/13 0500  Weight: 157.852 kg (348 lb) 157.2 kg (346 lb 9 oz) 154.7 kg (341 lb 0.8 oz)    Exam:  General:  NAD, NG tube in place  Cardiovascular: regular rate and rhythm, without MRG  Respiratory: good air movement, clear to auscultation throughout, no wheezing, ronchi or rales  Abdomen: soft, not tender to palpation, positive bowel sounds  MSK: chronic venous stasis changes bilateral LE  Neuro: non focal  Data Reviewed: Basic Metabolic Panel:  Recent Labs Lab 06/26/13 0433 06/27/13 0420 06/28/13 0513  06/29/13 1652 06/30/13 0410 07/01/13 0420 07/01/13 1835 07/02/13 0455  NA 131* 131* 135  < > 136 136 130* 131* 130*  K 2.9* 3.5 4.2  < > 3.2* 2.8* 3.4* 3.5 3.1*  CL 94* 97 98  < > 99 97 93* 93* 94*  CO2 26 22 29   < > 27 29 27 23 25   GLUCOSE 209* 220* 180*  < > 153* 146* 224* 194* 185*  BUN 12 16 16   < > 15 13 16  26* 33*  CREATININE 1.05 1.20 1.33  < > 1.24 1.20 1.95* 3.09* 3.06*  CALCIUM 8.3* 8.7 8.9  < > 8.4 8.5 9.0 8.6 8.7  MG  --  1.8 2.2  --   --  2.0  --   --   --   < > = values in this interval not displayed.  CBC:  Recent Labs Lab 06/26/13 0433 06/27/13 0420 06/28/13 0513 06/30/13 0410  WBC 9.6 12.0* 10.3 8.0  HGB 12.8* 13.1 13.2 12.6*  HCT 38.4* 38.4* 38.7* 37.3*  MCV 95.3 94.6 94.4 93.5  PLT 193 197 221 260   BNP (last 3 results)  Recent Labs  06/23/13 1832  PROBNP 2292.0*   CBG:  Recent Labs Lab 07/01/13 1152 07/01/13 1708 07/01/13 2125  07/02/13 0740 07/02/13 1202  GLUCAP 225* 224* 157* 183* 154*    Recent Results (from the past 240 hour(s))  CULTURE, BLOOD (ROUTINE X 2)     Status: None   Collection Time    06/23/13  8:45 PM      Result Value Range Status   Specimen Description BLOOD RIGHT  ARM   Final   Special Requests BOTTLES DRAWN AEROBIC AND ANAEROBIC 5CC   Final   Culture  Setup Time     Final   Value: 06/24/2013 02:51     Performed at [facility]   Culture     Final   Value: NO GROWTH 5 DAYS     Performed at [facility]   Report Status 06/30/2013 FINAL   Final  URINE CULTURE     Status: None   Collection Time    06/23/13  8:51 PM      Result Value Range Status   Specimen Description URINE, CLEAN CATCH   Final   Special Requests NONE   Final   Culture  Setup Time     Final   Value: 06/24/2013 03:32     Performed at [facility] Count     Final   Value: NO GROWTH     Performed at [facility]   Culture     Final   Value: NO GROWTH     Performed at [facility]   Report Status 06/25/2013 FINAL   Final  CULTURE, BLOOD (ROUTINE X 2)     Status: None   Collection Time    06/23/13  9:52 PM      Result Value Range Status   Specimen Description BLOOD RIGHT ANTECUBITAL   Final   Special Requests BOTTLES DRAWN AEROBIC AND ANAEROBIC 3CC   Final   Culture  Setup Time     Final   Value: 06/24/2013 03:45     Performed at [facility]   Culture     Final   Value: NO GROWTH 5 DAYS     Performed at [facility]   Report Status 06/30/2013 FINAL   Final  CLOSTRIDIUM DIFFICILE BY PCR     Status: None   Collection Time    06/24/13  1:44 AM      Result Value Range Status   C difficile by pcr NEGATIVE  NEGATIVE Final   Comment: Performed at [facility]   Studies: Dg Abd 1 View  07/01/2013   *RADIOLOGY REPORT*  Clinical Data: Abdominal pain and distention.  Abdominal ileus.  ABDOMEN - 1 VIEW  Comparison: 06/30/2013  Findings: Frontal  view of the abdomen is obtained on three images due to body habitus.  Small bowel dilatation similar to prior noted, measuring up to 7.6 cm.  Calcified gallstones in the right upper quadrant measure up to 1.6 cm in diameter.  No biliary gas observed.  IMPRESSION:  1.  Nonspecific small bowel dilatation, essentially stable in maximum diameter compared to prior exam. 2.  Cholelithiasis.   Original Report Authenticated By: Gaylyn Rong, M.D.   Dg Abd 2 Views  07/02/2013   *RADIOLOGY REPORT*  Clinical Data: Evaluate small bowel dilatation.  ABDOMEN - 2 VIEW  Comparison: 07/01/2013.  Findings: Nasogastric tube is looped in the stomach with the tip seen proximally.  There are multiple dilated loops of small bowel with associated air fluid levels. Suspect some colonic gas in the right upper quadrant.  No rectal gas.  Calcified stones are seen in the right upper quadrant.  Visualized lung bases show prominent extrapleural fat along the lateral aspect of the lower hemithoraces, versus loculated pleural fluid.  IMPRESSION:  1.  Persistent small bowel obstruction. 2.  Cholelithiasis.   Original Report Authenticated By: Leanna Battles, M.D.   Scheduled Meds: .  ceFAZolin (  ANCEF) IV  1 g Intravenous Q8H  . insulin aspart  0-15 Units Subcutaneous TID WC  . pantoprazole (PROTONIX) IV  40 mg Intravenous Q24H  . simethicone  160 mg Oral QID  . sodium chloride  3 mL Intravenous Q12H  . sodium chloride  3 mL Intravenous Q12H  . Warfarin - Pharmacist Dosing Inpatient   Does not apply q1800   Continuous Infusions: . 0.9 % NaCl with KCl 20 mEq / L 125 mL/hr at 07/02/13 1209   Principal Problem:   Adynamic ileus Active Problems:   Fever   Cellulitis   CHF (congestive heart failure)   Atrial fibrillation   Diarrhea   HTN (hypertension)   Diabetes mellitus   Hyperlipidemia   ARF (acute renal failure)   Unspecified intestinal obstruction   Nonspecific (abnormal) findings on radiological and other  examination of gastrointestinal tract  Time spent: 94  Pamella Pert, MD [facility] Pager [phone]. If 7 PM - 7 AM, please contact night-coverage at [email or web address], password [facility] 07/02/2013, 2:45 PM  LOS: 9 days

## 2013-07-02 NOTE — Progress Notes (Signed)
BPs have been low throughout the night, averaging around 89/50. Patient has been asymptomatic and stated he feels "great." NP on call, Maren Reamer, notified about BPs. Ordered BPs to be taken q2h. BPs have remained about the same, patient remaining asymptomatic. NP notified a second time, no new orders. Will continue to monitor.

## 2013-07-02 NOTE — Consult Note (Signed)
Reason for Consult:small bowel obstruction Referring Physician: Dr. Elvera Lennox   HPI: This is a surgical consultation for Edwin [name] who is a 72 year old male with a history of diabetes mellitus, hypertension, obesity, atrial fibrillation, CHF who presented to [facility] ED with malaise, fever, chills, nausea which began Saturday once week ago.  Associated with constipation which was preceded by diarrhea.  He denies change in stool caliber, weight loss, melena or hematochezia.  Denies vomiting. Denies abdominal pain, but felt bloated and full.   He has never had a colonoscopy.  States his father had kidney cancer, but denies family history of colon cancer.  He has 2 sets of twins(25M and 79F) who are healthy, children are healthy. He reports orthopnea and weight gain,no recent changes.   GI was consulted yesterday.  He now has a NGT, 6L out since it was placed.  This resulted in worsening renal function.  He is getting IV hydration.  He states that he feels much better today.  He had several small bowel movements this morning.  WBC 8/11 normal.  He is scheduled for a CT of abdomen and pelvis today.    Past Medical History  Diagnosis Date  . Hypertension     benign essential hypertension  . Diabetes mellitus     NIDDM  . Morbid obesity   . Pure hypercholesterolemia   . Long-term (current) use of anticoagulants   . Varicose veins of legs   . Pulmonary hypertension MODERATE  . Cor pulmonale, chronic   . Chronic right-sided CHF (congestive heart failure)   . Atrial fibrillation CARDIOLOGIST-- DR Jacinto Halim  . Anxiety   . Shortness of breath     with exertion   . Arthritis     Past Surgical History  Procedure Laterality Date  . Cardiovascular stress test  07-19-2012  DR Jacinto Halim    PROMINENT DIAPHRAGMATIC ATTENUATION/ NORMAL LVEF/ LOW RISK STUDY  . Transthoracic echocardiogram  05-16-2012    LOW NORMAL LVEF/ MOD. RV/ MILD HYPOKINESIS/ MOD. PULMONARY HTN/ CHRONIC COR PUMONALE/ NO SIG. CHANGE FROM 12-01-2010   . Mouth surgery    . Benign mole removal from nose     . Knee arthroscopy with medial menisectomy Left 04/23/2013    Procedure: LEFT KNEE ARTHROSCOPY WITH PARTIAL MEDIAL MENISECTOMY;  Surgeon: Drucilla Schmidt, MD;  Location: [facility];  Service: Orthopedics;  Laterality: Left;  . Cholecystectomy    . Colon surgery    . Small intestine surgery      Family History  Problem Relation Age of Onset  . Cancer Paternal Grandfather   . Hypertension Paternal Grandfather   . Stroke Paternal Grandfather   . Heart disease Mother   . Congestive Heart Failure Father   . Heart disease Father     Social History:  reports that he quit smoking about 29 years ago. His smoking use included Cigarettes. He smoked 1.00 pack per day. He has never used smokeless tobacco. He reports that  drinks alcohol. He reports that he does not use illicit drugs.  Allergies:  Allergies  Allergen Reactions  . Lisinopril Swelling    Tongue swelling    Medications: I have reviewed the patient's current medications.  Results for orders placed during the hospital encounter of 06/23/13 (from the past 48 hour(s))  GLUCOSE, CAPILLARY     Status: Abnormal   Collection Time    06/30/13 11:58 AM      Result Value Range   Glucose-Capillary 186 (*) 70 - 99 mg/dL  GLUCOSE, CAPILLARY     Status: Abnormal   Collection Time    06/30/13  4:42 PM      Result Value Range   Glucose-Capillary 171 (*) 70 - 99 mg/dL  GLUCOSE, CAPILLARY     Status: Abnormal   Collection Time    06/30/13  9:33 PM      Result Value Range   Glucose-Capillary 214 (*) 70 - 99 mg/dL  PROTIME-INR     Status: Abnormal   Collection Time    07/01/13  4:20 AM      Result Value Range   Prothrombin Time 26.9 (*) 11.6 - 15.2 seconds   INR 2.59 (*) 0.00 - 1.49  BASIC METABOLIC PANEL     Status: Abnormal   Collection Time    07/01/13  4:20 AM      Result Value Range   Sodium 130 (*) 135 - 145 mEq/L   Potassium 3.4 (*) 3.5 - 5.1 mEq/L   Comment: DELTA CHECK  NOTED     REPEATED TO VERIFY     NO VISIBLE HEMOLYSIS   Chloride 93 (*) 96 - 112 mEq/L   CO2 27  19 - 32 mEq/L   Glucose, Bld 224 (*) 70 - 99 mg/dL   BUN 16  6 - 23 mg/dL   Creatinine, Ser 4.09 (*) 0.50 - 1.35 mg/dL   Comment: DELTA CHECK NOTED     REPEATED TO VERIFY   Calcium 9.0  8.4 - 10.5 mg/dL   GFR calc non Af Amer 33 (*) >90 mL/min   GFR calc Af Amer 38 (*) >90 mL/min   Comment:            The eGFR has been calculated     using the CKD EPI equation.     This calculation has not been     validated in all clinical     situations.     eGFR's persistently     <90 mL/min signify     possible Chronic Kidney Disease.  GLUCOSE, CAPILLARY     Status: Abnormal   Collection Time    07/01/13  7:48 AM      Result Value Range   Glucose-Capillary 218 (*) 70 - 99 mg/dL   Comment 1 Notify RN    GLUCOSE, CAPILLARY     Status: Abnormal   Collection Time    07/01/13 11:52 AM      Result Value Range   Glucose-Capillary 225 (*) 70 - 99 mg/dL   Comment 1 Notify RN    GLUCOSE, CAPILLARY     Status: Abnormal   Collection Time    07/01/13  5:08 PM      Result Value Range   Glucose-Capillary 224 (*) 70 - 99 mg/dL  BASIC METABOLIC PANEL     Status: Abnormal   Collection Time    07/01/13  6:35 PM      Result Value Range   Sodium 131 (*) 135 - 145 mEq/L   Potassium 3.5  3.5 - 5.1 mEq/L   Chloride 93 (*) 96 - 112 mEq/L   CO2 23  19 - 32 mEq/L   Glucose, Bld 194 (*) 70 - 99 mg/dL   BUN 26 (*) 6 - 23 mg/dL   Creatinine, Ser 8.11 (*) 0.50 - 1.35 mg/dL   Comment: DELTA CHECK NOTED   Calcium 8.6  8.4 - 10.5 mg/dL   GFR calc non Af Amer 19 (*) >90 mL/min   GFR calc Af Amer 22 (*) >  90 mL/min   Comment:            The eGFR has been calculated     using the CKD EPI equation.     This calculation has not been     validated in all clinical     situations.     eGFR's persistently     <90 mL/min signify     possible Chronic Kidney Disease.  SODIUM, URINE, RANDOM     Status: None    Collection Time    07/01/13  9:12 PM      Result Value Range   Sodium, Ur (NOTE)     Comment: Result repeated and verified.     NAU IS <15     Performed at [facility]  CREATININE, URINE, RANDOM     Status: None   Collection Time    07/01/13  9:12 PM      Result Value Range   Creatinine, Urine 358.3     Comment: (NOTE)     Result repeated and verified.     Result confirmed by automatic dilution.     Performed at [facility]  GLUCOSE, CAPILLARY     Status: Abnormal   Collection Time    07/01/13  9:25 PM      Result Value Range   Glucose-Capillary 157 (*) 70 - 99 mg/dL  PROTIME-INR     Status: Abnormal   Collection Time    07/02/13  4:55 AM      Result Value Range   Prothrombin Time 33.2 (*) 11.6 - 15.2 seconds   INR 3.42 (*) 0.00 - 1.49  BASIC METABOLIC PANEL     Status: Abnormal   Collection Time    07/02/13  4:55 AM      Result Value Range   Sodium 130 (*) 135 - 145 mEq/L   Potassium 3.1 (*) 3.5 - 5.1 mEq/L   Chloride 94 (*) 96 - 112 mEq/L   CO2 25  19 - 32 mEq/L   Glucose, Bld 185 (*) 70 - 99 mg/dL   BUN 33 (*) 6 - 23 mg/dL   Creatinine, Ser 4.09 (*) 0.50 - 1.35 mg/dL   Calcium 8.7  8.4 - 81.1 mg/dL   GFR calc non Af Amer 19 (*) >90 mL/min   GFR calc Af Amer 22 (*) >90 mL/min   Comment:            The eGFR has been calculated     using the CKD EPI equation.     This calculation has not been     validated in all clinical     situations.     eGFR's persistently     <90 mL/min signify     possible Chronic Kidney Disease.  GLUCOSE, CAPILLARY     Status: Abnormal   Collection Time    07/02/13  7:40 AM      Result Value Range   Glucose-Capillary 183 (*) 70 - 99 mg/dL    Dg Abd 1 View  08/03/7828   *RADIOLOGY REPORT*  Clinical Data: Abdominal pain and distention.  Abdominal ileus.  ABDOMEN - 1 VIEW  Comparison: 06/30/2013  Findings: Frontal view of the abdomen is obtained on three images due to body habitus.  Small bowel dilatation similar to  prior noted, measuring up to 7.6 cm.  Calcified gallstones in the right upper quadrant measure up to 1.6 cm in diameter.  No biliary gas observed.  IMPRESSION:  1.  Nonspecific  small bowel dilatation, essentially stable in maximum diameter compared to prior exam. 2.  Cholelithiasis.   Original Report Authenticated By: Gaylyn Rong, M.D.   Dg Abd 2 Views  07/02/2013   *RADIOLOGY REPORT*  Clinical Data: Evaluate small bowel dilatation.  ABDOMEN - 2 VIEW  Comparison: 07/01/2013.  Findings: Nasogastric tube is looped in the stomach with the tip seen proximally.  There are multiple dilated loops of small bowel with associated air fluid levels. Suspect some colonic gas in the right upper quadrant.  No rectal gas.  Calcified stones are seen in the right upper quadrant.  Visualized lung bases show prominent extrapleural fat along the lateral aspect of the lower hemithoraces, versus loculated pleural fluid.  IMPRESSION:  1.  Persistent small bowel obstruction. 2.  Cholelithiasis.   Original Report Authenticated By: Leanna Battles, M.D.    Review of Systems  Constitutional: Positive for malaise/fatigue. Negative for fever, chills, weight loss and diaphoresis.  Respiratory: Negative for shortness of breath.        +DOE  Cardiovascular: Positive for orthopnea and leg swelling. Negative for chest pain and palpitations.  Gastrointestinal: Positive for nausea. Negative for vomiting, diarrhea, blood in stool and melena.  Genitourinary: Negative for dysuria.  Neurological: Negative for dizziness, tremors, sensory change, focal weakness, loss of consciousness and weakness.   Blood pressure 89/50, pulse 91, temperature 98.2 F (36.8 C), temperature source Oral, resp. rate 19, height 5\' 11"  (1.803 m), weight 341 lb 0.8 oz (154.7 kg), SpO2 96.00%. Physical Exam  Constitutional: He is oriented to person, place, and time. He appears well-developed and well-nourished. No distress.  HENT:  Head: Normocephalic and  atraumatic.  Eyes: Conjunctivae and EOM are normal. Pupils are equal, round, and reactive to light. Right eye exhibits no discharge. Left eye exhibits no discharge. No scleral icterus.  Cardiovascular: Normal heart sounds and intact distal pulses.  Exam reveals no gallop and no friction rub.   No murmur heard. Regularly irregular   Respiratory: Effort normal and breath sounds normal. He has no wheezes. He exhibits no tenderness.  GI: Bowel sounds are normal. He exhibits distension.  Very mildly tender in epigastric region, no guarding or evidence of peritonitis   Musculoskeletal: He exhibits no tenderness.  Lymphadenopathy:    He has no cervical adenopathy.  Neurological: He is alert and oriented to person, place, and time.  Skin: Skin is warm and dry. He is not diaphoretic. No pallor.  +2-3 pitting LE edema, erythema, chronic skin changes  Psychiatric: He has a normal mood and affect. His behavior is normal. Judgment and thought content normal.    Assessment/Plan: Small bowel obstruction: etiology is unclear.  Previous episode requiring NGT decompression in 2001 and 2006 which did not require hospitalization, no history of abdominal surgery, no hernias on exam.  He has never had a colonoscopy.  Agree with NGT, which still has bilious output.  CT of abdomen scheduled today.  Pt is anticoagulated.  There are no indications for surgical intervention at this time.  We will continue to follow.  Thank you for the kind consult.    Angelicia Lessner  ANP-BC   07/02/2013, 11:37 AM

## 2013-07-02 NOTE — Progress Notes (Signed)
ANTICOAGULATION CONSULT NOTE  Pharmacy Consult for Warfarin Indication: atrial fibrillation  Allergies  Allergen Reactions  . Lisinopril Swelling    Tongue swelling    Patient Measurements: Height: 5\' 11"  (180.3 cm) Weight: 341 lb 0.8 oz (154.7 kg) IBW/kg (Calculated) : 75.3  Vital Signs: Temp: 98.2 F (36.8 C) (08/13 0410) Temp src: Oral (08/13 0410) BP: 89/50 mmHg (08/13 0410) Pulse Rate: 91 (08/13 0410)  Labs:  Recent Labs  06/30/13 0410 07/01/13 0420 07/01/13 1835 07/02/13 0455  HGB 12.6*  --   --   --   HCT 37.3*  --   --   --   PLT 260  --   --   --   LABPROT 28.3* 26.9*  --  33.2*  INR 2.77* 2.59*  --  3.42*  CREATININE 1.20 1.95* 3.09* 3.06*    Estimated Creatinine Clearance: 33.5 ml/min (by C-G formula based on Cr of 3.06).  Medications:  Scheduled:  . bisacodyl  10 mg Rectal Daily  .  ceFAZolin (ANCEF) IV  1 g Intravenous Q8H  . insulin aspart  0-15 Units Subcutaneous TID WC  . metoCLOPramide (REGLAN) injection  5 mg Intravenous Q6H  . pantoprazole (PROTONIX) IV  40 mg Intravenous Q24H  . simethicone  160 mg Oral QID  . sodium chloride  3 mL Intravenous Q12H  . sodium chloride  3 mL Intravenous Q12H  . Warfarin - Pharmacist Dosing Inpatient   Does not apply q1800   Infusions:  . sodium chloride 75 mL/hr at 07/02/13 0013    Assessment: 72 yo M admitted 06/23/13, on chronic anticoagulation with warfarin for A-fib. Patient started on Lovenox bridge on 8/6 d/t subtherapeutic INR and active Afib; Lovenox d/c'd 8/9 when INR was therapeutic. Home warfarin dose reported as 5mg  MWFSun and 2.5mg  TuThSat Patient has been receiving larger warfarin doses than usual in hospital due to subtherapeutic INR on admission (7.5mg  and 10mg  doses) INR jumped up again over night, now >3. Held 8/10 dose.   Goal of Therapy:  INR 2-3 Monitor platelets by anticoagulation protocol: Yes   Plan:   No warfarin tonight, resume once INR <3   Daily PT/INR  Darrol Angel, PharmD Pager: [phone] 8:46 AM 07/02/2013

## 2013-07-02 NOTE — Progress Notes (Signed)
[facility] Progress Note   Subjective  Feels better today.    Objective   Vital signs in last 24 hours: Temp:  [97.5 F (36.4 C)-98.2 F (36.8 C)] 98.2 F (36.8 C) (08/13 0410) Pulse Rate:  [79-91] 91 (08/13 0410) Resp:  [19-20] 19 (08/13 0410) BP: (89-109)/(47-89) 89/50 mmHg (08/13 0410) SpO2:  [95 %-96 %] 96 % (08/13 0410) Weight:  [341 lb 0.8 oz (154.7 kg)] 341 lb 0.8 oz (154.7 kg) (08/13 0500) Last BM Date: 07/01/13 General:    Pleasant white male in NAD Abdomen:  Obese but distended. Nontender. A few bowel sounds present.  Neurologic:  Alert and oriented,  grossly normal neurologically. Psych:  Cooperative. Normal mood and affect.  Lab Results:  Recent Labs  06/30/13 0410  WBC 8.0  HGB 12.6*  HCT 37.3*  PLT 260   BMET  Recent Labs  07/01/13 0420 07/01/13 1835 07/02/13 0455  NA 130* 131* 130*  K 3.4* 3.5 3.1*  CL 93* 93* 94*  CO2 27 23 25   GLUCOSE 224* 194* 185*  BUN 16 26* 33*  CREATININE 1.95* 3.09* 3.06*  CALCIUM 9.0 8.6 8.7   PT/INR  Recent Labs  07/01/13 0420 07/02/13 0455  LABPROT 26.9* 33.2*  INR 2.59* 3.42*    Studies/Results: Dg Abd 1 View  07/01/2013   *RADIOLOGY REPORT*  Clinical Data: Abdominal pain and distention.  Abdominal ileus.  ABDOMEN - 1 VIEW  Comparison: 06/30/2013  Findings: Frontal view of the abdomen is obtained on three images due to body habitus.  Small bowel dilatation similar to prior noted, measuring up to 7.6 cm.  Calcified gallstones in the right upper quadrant measure up to 1.6 cm in diameter.  No biliary gas observed.  IMPRESSION:  1.  Nonspecific small bowel dilatation, essentially stable in maximum diameter compared to prior exam. 2.  Cholelithiasis.   Original Report Authenticated By: Gaylyn Rong, M.D.   Dg Abd 2 Views  07/02/2013   *RADIOLOGY REPORT*  Clinical Data: Evaluate small bowel dilatation.  ABDOMEN - 2 VIEW  Comparison: 07/01/2013.  Findings: Nasogastric tube is looped in the stomach  with the tip seen proximally.  There are multiple dilated loops of small bowel with associated air fluid levels. Suspect some colonic gas in the right upper quadrant.  No rectal gas.  Calcified stones are seen in the right upper quadrant.  Visualized lung bases show prominent extrapleural fat along the lateral aspect of the lower hemithoraces, versus loculated pleural fluid.  IMPRESSION:  1.  Persistent small bowel obstruction. 2.  Cholelithiasis.   Original Report Authenticated By: Leanna Battles, M.D.     Assessment / Plan:   1. Small bowel obstruction of unclear etiology. No change on today's KUB. He had 6,100 ml NGT output yesterday. Will obtain CTenterography (no IV contrast) today for further evaluation. May need surgical consult soon. Reglan stopped.   2. Hypokalemia. Will give KCL runs and add to IV fluids.   3. Acute renal failure likely volume depletion. Will given 500cc bolus and increase IV rate to 16ml/hr. Recheck am BMET  3. Multiple medical problems. On chronic coumadin  4. Claustrophobia. He is concerned about CTscan. Will give IV ativan prior to exam    LOS: 9 days   Willette Cluster  07/02/2013, 9:49 AM Attending MD note:   I have reviewed the above note, examined the patient and agree with plan of treatment. Rather than CT enterography the radiologist suggests CT scan with oral contrast via NG tube  To follow the  Oral contrast through the small bowl looking for a transition point. He is tolerating NG tube, having quiet bowl sounds, minimal tenderness. Watch renal function and K+  W[facility] Pager # [phone]

## 2013-07-02 NOTE — Consult Note (Signed)
Pt seen and examined.  He is distended and tympanitic.  He says that he had similar problems 10 yrs ago which resolved without surgery.  He denies any abdominal surgery.  He does have umbilical hernia but difficult to assess due to habitus. This could be malignancy (no prior endoscopies), volvulus, or ileus due to other metabolic issues and cellulitis.  He is now pain free since NG placement.  I think that the CT will be helpful to further evaluate.  If no obvious cause is found then I would continue with nonop management.  Regardless of the findings, he will be high risk for complications with obesity and comorbidities.  He needs improvement and resussitation prior to any procedure.

## 2013-07-03 DIAGNOSIS — R112 Nausea with vomiting, unspecified: Secondary | ICD-10-CM

## 2013-07-03 DIAGNOSIS — R109 Unspecified abdominal pain: Secondary | ICD-10-CM

## 2013-07-03 LAB — GLUCOSE, CAPILLARY
Glucose-Capillary: 130 mg/dL — ABNORMAL HIGH (ref 70–99)
Glucose-Capillary: 143 mg/dL — ABNORMAL HIGH (ref 70–99)

## 2013-07-03 LAB — PROTIME-INR
INR: 4.2 — ABNORMAL HIGH (ref 0.00–1.49)
Prothrombin Time: 38.9 seconds — ABNORMAL HIGH (ref 11.6–15.2)

## 2013-07-03 LAB — CBC
MCV: 93.1 fL (ref 78.0–100.0)
Platelets: 328 10*3/uL (ref 150–400)
RBC: 3.61 MIL/uL — ABNORMAL LOW (ref 4.22–5.81)
WBC: 9.5 10*3/uL (ref 4.0–10.5)

## 2013-07-03 LAB — BASIC METABOLIC PANEL
CO2: 26 mEq/L (ref 19–32)
Calcium: 8.7 mg/dL (ref 8.4–10.5)
GFR calc Af Amer: 31 mL/min — ABNORMAL LOW (ref >90)
Sodium: 134 mEq/L — ABNORMAL LOW (ref 135–145)

## 2013-07-03 MED ORDER — HYDROMORPHONE HCL PF 1 MG/ML IJ SOLN
0.5000 mg | Freq: Once | INTRAMUSCULAR | Status: AC
  Administered 2013-07-03: 0.5 mg via INTRAVENOUS
  Filled 2013-07-03: qty 1

## 2013-07-03 MED ORDER — HYDROCORTISONE 2.5 % RE CREA
TOPICAL_CREAM | Freq: Four times a day (QID) | RECTAL | Status: DC | PRN
  Administered 2013-07-03: 03:00:00 via RECTAL
  Filled 2013-07-03: qty 28.35

## 2013-07-03 MED ORDER — POTASSIUM CHLORIDE 10 MEQ/100ML IV SOLN
10.0000 meq | INTRAVENOUS | Status: AC
  Administered 2013-07-03 (×4): 10 meq via INTRAVENOUS
  Filled 2013-07-03 (×4): qty 100

## 2013-07-03 MED ORDER — POTASSIUM CHLORIDE 10 MEQ/100ML IV SOLN
10.0000 meq | INTRAVENOUS | Status: AC
  Administered 2013-07-03 (×3): 10 meq via INTRAVENOUS
  Filled 2013-07-03 (×3): qty 100

## 2013-07-03 NOTE — Progress Notes (Signed)
NGT pulled back about 6 inches per MD order. Will continue to monitor. Julio Sicks RN

## 2013-07-03 NOTE — Progress Notes (Signed)
PT Cancellation Note  Patient Details Name: Edwin [name] MRN: [id number] DOB: 16-Aug-1941   Cancelled Treatment:    Reason Eval/Treat Not Completed: Pt declined to ambulate with therapy at this time. Will check back another time/day for tx session. Thanks.    Rebeca Alert, MPT Pager: [phone]

## 2013-07-03 NOTE — Progress Notes (Signed)
Subjective: Pt asking to eat, drinking lots of water, lemonade, ice chips.  No flatus or bm yet.  NGT to suction.    Objective: Vital signs in last 24 hours: Temp:  [97.5 F (36.4 C)-98.4 F (36.9 C)] 97.5 F (36.4 C) (08/14 0609) Pulse Rate:  [69-76] 69 (08/14 0609) Resp:  [18-20] 20 (08/14 0609) BP: (107-109)/(51-59) 109/51 mmHg (08/14 0609) SpO2:  [96 %-99 %] 99 % (08/14 0609) Weight:  [339 lb (153.769 kg)] 339 lb (153.769 kg) (08/14 0609) Last BM Date: 07/01/13  Intake/Output from previous day: 08/13 0701 - 08/14 0700 In: 5396.7 [P.O.:1680; I.V.:3366.7; IV Piggyback:350] Out: 5075 [Urine:425; Emesis/NG output:4650] Intake/Output this shift: Total I/O In: 200 [P.O.:200] Out: -   Physical Exam  Constitutional: He is oriented to person, place, and time. He appears well-developed and well-nourished. No distress.  Cardiovascular: Normal heart sounds and intact distal pulses. Exam reveals no gallop and no friction rub.  No murmur heard. Regularly irregular  Respiratory: Effort normal and breath sounds normal. He has no wheezes. He exhibits no tenderness.  GI: Bowel sounds are hypoactive. He exhibits distension.  Very mildly tender in epigastric region, no guarding or evidence of peritonitis  Musculoskeletal: He exhibits no tenderness.  Neurological: He is alert and oriented to person, place, and time.  Skin: Skin is warm and dry. He is not diaphoretic. No pallor.  +2-3 pitting LE edema, erythema, chronic skin changes  Psychiatric: He has a normal mood and affect. His behavior is normal. Judgment and thought content normal.   Lab Results:   Recent Labs  07/03/13 0521  WBC 9.5  HGB 11.6*  HCT 33.6*  PLT 328   BMET  Recent Labs  07/02/13 0455 07/03/13 0521  NA 130* 134*  K 3.1* 3.0*  CL 94* 98  CO2 25 26  GLUCOSE 185* 154*  BUN 33* 42*  CREATININE 3.06* 2.33*  CALCIUM 8.7 8.7   PT/INR  Recent Labs  07/02/13 0455 07/03/13 0521  LABPROT 33.2* 38.9*   INR 3.42* 4.20*   Studies/Results: Ct Abdomen Pelvis Wo Contrast  07/02/2013   *RADIOLOGY REPORT*  Clinical Data: Small bowel obstruction.  Fever and chills.  CT ABDOMEN AND PELVIS WITHOUT CONTRAST  Technique:  Multidetector CT imaging of the abdomen and pelvis was performed following the standard protocol without intravenous contrast.  Comparison: None.  Findings: Moderate dilatation of small bowel is demonstrated, which contain air fluid levels.  There is also distention and air-fluid levels seen within the the nondependent transverse colon.  No transition point is seen.  This suggests an adynamic ileus rather than a small bowel obstruction.  There is no evidence of bowel wall thickening or focal inflammatory process.  No evidence of abscess, free fluid, or free air.  Cholelithiasis is demonstrated, without evidence of cholecystitis. A feeding tube is seen with tip in the proximal duodenum.  Noncontrast images of the liver, spleen, pancreas, and adrenal glands are unremarkable.  A small right renal cyst noted, however kidneys otherwise unremarkable.  No evidence of hydronephrosis.  No soft tissue masses or lymphadenopathy identified.  IMPRESSION:  1.  Moderate dilatation of small bowel and nondependent transverse colon, without evidence of transition point.  This favors an adynamic ileus over small bowel obstruction. 2.  No mass or inflammatory process identified. 3. Cholelithiasis.  No radiographic evidence of cholecystitis.   Original Report Authenticated By: Myles Rosenthal, M.D.   Dg Abd 2 Views  07/02/2013   *RADIOLOGY REPORT*  Clinical Data: Evaluate small bowel  dilatation.  ABDOMEN - 2 VIEW  Comparison: 07/01/2013.  Findings: Nasogastric tube is looped in the stomach with the tip seen proximally.  There are multiple dilated loops of small bowel with associated air fluid levels. Suspect some colonic gas in the right upper quadrant.  No rectal gas.  Calcified stones are seen in the right upper quadrant.   Visualized lung bases show prominent extrapleural fat along the lateral aspect of the lower hemithoraces, versus loculated pleural fluid.  IMPRESSION:  1.  Persistent small bowel obstruction. 2.  Cholelithiasis.   Original Report Authenticated By: Leanna Battles, M.D.    Anti-infectives: Anti-infectives   Start     Dose/Rate Route Frequency Ordered Stop   06/28/13 1600  ceFAZolin (ANCEF) IVPB 1 g/50 mL premix     1 g 100 mL/hr over 30 Minutes Intravenous 3 times per day 06/28/13 1548     06/24/13 1000  vancomycin (VANCOCIN) 1,500 mg in sodium chloride 0.9 % 500 mL IVPB  Status:  Discontinued     1,500 mg 250 mL/hr over 120 Minutes Intravenous Every 12 hours 06/23/13 1959 06/28/13 1547   06/24/13 0600  piperacillin-tazobactam (ZOSYN) IVPB 3.375 g  Status:  Discontinued     3.375 g 12.5 mL/hr over 240 Minutes Intravenous 3 times per day 06/23/13 1949 06/26/13 0830   06/23/13 2200  vancomycin (VANCOCIN) 2,000 mg in sodium chloride 0.9 % 500 mL IVPB     2,000 mg 250 mL/hr over 120 Minutes Intravenous  Once 06/23/13 1959 06/24/13 0005   06/23/13 2000  piperacillin-tazobactam (ZOSYN) IVPB 3.375 g     3.375 g 100 mL/hr over 30 Minutes Intravenous  Once 06/23/13 1949 06/23/13 2322      Assessment/Plan: Ileus versus PSBO: still remains unclear, CT of abdomen suggests adynamic ileus.  4665ml/24h, but drinking lots of fluids.  GI following.  Has a KUB today.  No surgical intervention warranted at this time, furthermore he is a high surgical risk given his co morbidities.  We will continue to follow along.     LOS: 10 days    Bonner Puna [facility] ANP-BC Pager [phone]  07/03/2013 11:08 AM  Still with absent bowel sounds.  High NG output and green appearance.  I would recommend correcting his electrolytes and underlying metabolic problems and infection and hopefully he will regain some bowel function soon.  This does appear to be diffuse dilation and with absent bowel sounds, this is likely ileus

## 2013-07-03 NOTE — Progress Notes (Signed)
ANTICOAGULATION CONSULT NOTE  Pharmacy Consult for Warfarin Indication: atrial fibrillation  Allergies  Allergen Reactions  . Lisinopril Swelling    Tongue swelling    Patient Measurements: Height: 5\' 11"  (180.3 cm) Weight: 339 lb (153.769 kg) IBW/kg (Calculated) : 75.3  Vital Signs: Temp: 97.5 F (36.4 C) (08/14 0609) Temp src: Axillary (08/14 0609) BP: 109/51 mmHg (08/14 0609) Pulse Rate: 69 (08/14 0609)  Labs:  Recent Labs  07/01/13 0420 07/01/13 1835 07/02/13 0455 07/03/13 0521  HGB  --   --   --  11.6*  HCT  --   --   --  33.6*  PLT  --   --   --  328  LABPROT 26.9*  --  33.2* 38.9*  INR 2.59*  --  3.42* 4.20*  CREATININE 1.95* 3.09* 3.06* 2.33*    Estimated Creatinine Clearance: 43.9 ml/min (by C-G formula based on Cr of 2.33).  Medications:  Scheduled:  .  ceFAZolin (ANCEF) IV  1 g Intravenous Q8H  . insulin aspart  0-15 Units Subcutaneous TID WC  . pantoprazole (PROTONIX) IV  40 mg Intravenous Q24H  . simethicone  160 mg Oral QID  . sodium chloride  3 mL Intravenous Q12H  . sodium chloride  3 mL Intravenous Q12H  . Warfarin - Pharmacist Dosing Inpatient   Does not apply q1800   Infusions:  . 0.9 % NaCl with KCl 20 mEq / L 125 mL/hr at 07/03/13 8657    Assessment: 72 yo M admitted 06/23/13, on chronic anticoagulation with warfarin for A-fib. Patient started on Lovenox bridge on 8/6 d/t subtherapeutic INR and active Afib; Lovenox d/c'd 8/9 when INR was therapeutic. Home warfarin dose reported as 5mg  MWFSun and 2.5mg  TuThSat Patient has been receiving larger warfarin doses than usual in hospital due to subtherapeutic INR on admission (7.5mg  and 10mg  doses) INR now supratherapeutic and rising, likely from decreased po intake with SBO Since no visible bleeding reported, no indication for vitamin K at this time  Goal of Therapy:  INR 2-3 Monitor platelets by anticoagulation protocol: Yes   Plan:   No warfarin tonight, resume once INR <3   Daily  PT/INR  Loralee Pacas, PharmD, BCPS Pager: [phone] 7:32 AM 07/03/2013

## 2013-07-03 NOTE — Progress Notes (Signed)
Gastroenterology Progress Note   Subjective  No specific complaints but I awoke him from sleep. Denies flatus   Objective   Vital signs in last 24 hours: Temp:  [97.5 F (36.4 C)-98.4 F (36.9 C)] 97.5 F (36.4 C) (08/14 0609) Pulse Rate:  [69-76] 69 (08/14 0609) Resp:  [18-20] 20 (08/14 0609) BP: (107-109)/(51-59) 109/51 mmHg (08/14 0609) SpO2:  [96 %-99 %] 99 % (08/14 0609) Weight:  [339 lb (153.769 kg)] 339 lb (153.769 kg) (08/14 0609) Last BM Date: 07/01/13 General:    Pleasant white male in NAD. NGT in place Abdomen:  Soft, nontender. He has a few bowel sounds (some normal and some "tinkling" ones). Extremities:  Without edema. Neurologic:  oriented,  grossly normal neurologically. Psych:  Cooperative. Normal mood and affect.   Lab Results:  Recent Labs  07/03/13 0521  WBC 9.5  HGB 11.6*  HCT 33.6*  PLT 328   BMET  Recent Labs  07/01/13 1835 07/02/13 0455 07/03/13 0521  NA 131* 130* 134*  K 3.5 3.1* 3.0*  CL 93* 94* 98  CO2 23 25 26   GLUCOSE 194* 185* 154*  BUN 26* 33* 42*  CREATININE 3.09* 3.06* 2.33*  CALCIUM 8.6 8.7 8.7   PT/INR  Recent Labs  07/02/13 0455 07/03/13 0521  LABPROT 33.2* 38.9*  INR 3.42* 4.20*    Studies/Results: Ct Abdomen Pelvis Wo Contrast  07/02/2013   *RADIOLOGY REPORT*  Clinical Data: Small bowel obstruction.  Fever and chills.  CT ABDOMEN AND PELVIS WITHOUT CONTRAST  Technique:  Multidetector CT imaging of the abdomen and pelvis was performed following the standard protocol without intravenous contrast.  Comparison: None.  Findings: Moderate dilatation of small bowel is demonstrated, which contain air fluid levels.  There is also distention and air-fluid levels seen within the the nondependent transverse colon.  No transition point is seen.  This suggests an adynamic ileus rather than a small bowel obstruction.  There is no evidence of bowel wall thickening or focal inflammatory process.  No evidence of abscess, free  fluid, or free air.  Cholelithiasis is demonstrated, without evidence of cholecystitis. A feeding tube is seen with tip in the proximal duodenum.  Noncontrast images of the liver, spleen, pancreas, and adrenal glands are unremarkable.  A small right renal cyst noted, however kidneys otherwise unremarkable.  No evidence of hydronephrosis.  No soft tissue masses or lymphadenopathy identified.  IMPRESSION:  1.  Moderate dilatation of small bowel and nondependent transverse colon, without evidence of transition point.  This favors an adynamic ileus over small bowel obstruction. 2.  No mass or inflammatory process identified. 3. Cholelithiasis.  No radiographic evidence of cholecystitis.   Original Report Authenticated By: Myles Rosenthal, M.D.      Assessment / Plan:   1. Ileus vrs small bowel obstruction of unclear etiology. CTscan yesterday shows moderately dilated small bowel and nondependent transverse colon.  Radiologist favors Ileus over SBO  NGT output 4650 ml yesterday but he has been taking clear liquids so difficult to interpret. Will check am KUB, if better then maybe we can try clamping NGT. Surgery is following.   2. Hypokalemia. Persistent despite K+ runs and adding K+ to IVF. Will give more K+ runs today.  Recheck am BMET. His mg+ levels have been okay  3. Acute renal failure likely volume depletion. Improved after stopping diuretics, giving fluid bolus and increasing IVF rate. Creatinine 2.33, down from 3.06 yesterday. Recheck am BMET  3. Multiple medical problems. On chronic coumadin. INR  4.2 today, will defer to primary team   LOS: 10 days   Willette Cluster  07/03/2013, 9:04 AM Attending MD note:   I have reviewed the above note, examined the patient and agree with plan of treatment. Surprisingly, no evidence of mechanical obstruction on CT scan. 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 He is feeling back to the baseline. I am in favor of starting to clamp NG tube q 4 hours x 4 hrs, on and off  till tomorrow am and possibly remove in am. Abdomen is soft and he is having some bowl sounds.  Willa [facility] Pager # [phone]

## 2013-07-03 NOTE — Progress Notes (Signed)
[facility] PROGRESS NOTE  Edwin [name] WUJ:[id number] DOB: 02-27-1941 DOA: 06/23/2013 PCP: Aura Dials, MD  Assessment/Plan: SBO: abd distension/bloating, now improved s/p NG tube - No emesis. - GI/Surgery following.  SIRS/Right lower extremity cellulitis.  - Significant improvement now. S/p Vancomycin for 5 days, now on Ancef, day 10/10. Blood Cx NGTD - dopplers negative for DVT  AKI - may be multifactorial, can be due to diuresis superimposed on significant GI losses with 6L output from NG tube. - d/c lasix and provide supportive hydration - improving, continue to monitor.  CHF - acute on chronic likely diastolic - D/C IV lasix 80mg  q12, - 2D ECHO with EF of 55-60%. No wall motion abnormalities. Left atrium is severely dilated.  - unable to get acurate I/Os. Follow.  Atrial fibrillation  - rate controlled currently. Continue Coumadin per pharmacy, INR therapeutic. Beta blocker on hold secondary to bradycardia. May need to resume a low dose if needed for heart rate Diabetes mellitus type 2  - closely follow CBGs with sliding-scale, hold metformin, AIC 7.9 Hypertension  - D/C'd Lopressor secondary to bradycardia. Blood pressure stable follow Hypokalemia-replace  - bmet in am  Morbid obesity  Asymptomatic bradycardia  - Patient noted to have bradycardia in the 30s while sleeping however improve on awakening. Per nursing patient with some 2 second pauses. Bradycardia improved off Lopressor. Follow.   Code Status: Full Family Communication: none  Disposition Plan: home when medically ready  Consultants:  GI  Surgery  Procedures:  none  Anti-infectives   Start     Dose/Rate Route Frequency Ordered Stop   06/28/13 1600  ceFAZolin (ANCEF) IVPB 1 g/50 mL premix     1 g 100 mL/hr over 30 Minutes Intravenous 3 times per day 06/28/13 1548     06/24/13 1000  vancomycin (VANCOCIN) 1,500 mg in sodium chloride 0.9 % 500 mL IVPB  Status:  Discontinued     1,500 mg 250  mL/hr over 120 Minutes Intravenous Every 12 hours 06/23/13 1959 06/28/13 1547   06/24/13 0600  piperacillin-tazobactam (ZOSYN) IVPB 3.375 g  Status:  Discontinued     3.375 g 12.5 mL/hr over 240 Minutes Intravenous 3 times per day 06/23/13 1949 06/26/13 0830   06/23/13 2200  vancomycin (VANCOCIN) 2,000 mg in sodium chloride 0.9 % 500 mL IVPB     2,000 mg 250 mL/hr over 120 Minutes Intravenous  Once 06/23/13 1959 06/24/13 0005   06/23/13 2000  piperacillin-tazobactam (ZOSYN) IVPB 3.375 g     3.375 g 100 mL/hr over 30 Minutes Intravenous  Once 06/23/13 1949 06/23/13 2322     Antibiotics Given (last 72 hours)   Date/Time Action Medication Dose Rate   06/30/13 2124 Given   ceFAZolin (ANCEF) IVPB 1 g/50 mL premix 1 g 100 mL/hr   07/01/13 0530 Given   ceFAZolin (ANCEF) IVPB 1 g/50 mL premix 1 g 100 mL/hr   07/01/13 1617 Given   ceFAZolin (ANCEF) IVPB 1 g/50 mL premix 1 g 100 mL/hr   07/01/13 2121 Given   ceFAZolin (ANCEF) IVPB 1 g/50 mL premix 1 g 100 mL/hr   07/02/13 0546 Given   ceFAZolin (ANCEF) IVPB 1 g/50 mL premix 1 g 100 mL/hr   07/02/13 1659 Given   ceFAZolin (ANCEF) IVPB 1 g/50 mL premix 1 g 100 mL/hr   07/02/13 2346 Given   ceFAZolin (ANCEF) IVPB 1 g/50 mL premix 1 g 100 mL/hr   07/03/13 0622 Given   ceFAZolin (ANCEF) IVPB 1 g/50 mL premix 1 g  100 mL/hr   07/03/13 1325 Given   ceFAZolin (ANCEF) IVPB 1 g/50 mL premix 1 g 100 mL/hr     HPI/Subjective: - no complaints, feels better  Objective: Filed Vitals:   07/02/13 1631 07/02/13 2139 07/03/13 0609 07/03/13 1410  BP: 107/59 107/52 109/51 115/66  Pulse: 76 74 69 79  Temp: 98.4 F (36.9 C) 98 F (36.7 C) 97.5 F (36.4 C) 97.3 F (36.3 C)  TempSrc: Oral Oral Axillary Axillary  Resp: 18 18 20 21   Height:      Weight:   153.769 kg (339 lb)   SpO2: 97% 96% 99% 96%    Intake/Output Summary (Last 24 hours) at 07/03/13 1714 Last data filed at 07/03/13 1500  Gross per 24 hour  Intake 4216.67 ml  Output   5175 ml   Net -958.33 ml   Filed Weights   07/01/13 0435 07/02/13 0500 07/03/13 0609  Weight: 157.2 kg (346 lb 9 oz) 154.7 kg (341 lb 0.8 oz) 153.769 kg (339 lb)    Exam:  General:  NAD, NG tube in place  Cardiovascular: regular rate and rhythm, without MRG  Respiratory: good air movement, clear to auscultation throughout, no wheezing, ronchi or rales  Abdomen: soft, not tender to palpation, positive bowel sounds  MSK: chronic venous stasis changes bilateral LE  Neuro: non focal  Data Reviewed: Basic Metabolic Panel:  Recent Labs Lab 06/27/13 0420 06/28/13 0513  06/30/13 0410 07/01/13 0420 07/01/13 1835 07/02/13 0455 07/03/13 0521  NA 131* 135  < > 136 130* 131* 130* 134*  K 3.5 4.2  < > 2.8* 3.4* 3.5 3.1* 3.0*  CL 97 98  < > 97 93* 93* 94* 98  CO2 22 29  < > 29 27 23 25 26   GLUCOSE 220* 180*  < > 146* 224* 194* 185* 154*  BUN 16 16  < > 13 16 26* 33* 42*  CREATININE 1.20 1.33  < > 1.20 1.95* 3.09* 3.06* 2.33*  CALCIUM 8.7 8.9  < > 8.5 9.0 8.6 8.7 8.7  MG 1.8 2.2  --  2.0  --   --   --   --   < > = values in this interval not displayed.  CBC:  Recent Labs Lab 06/27/13 0420 06/28/13 0513 06/30/13 0410 07/03/13 0521  WBC 12.0* 10.3 8.0 9.5  HGB 13.1 13.2 12.6* 11.6*  HCT 38.4* 38.7* 37.3* 33.6*  MCV 94.6 94.4 93.5 93.1  PLT 197 221 260 328   BNP (last 3 results)  Recent Labs  06/23/13 1832  PROBNP 2292.0*   CBG:  Recent Labs Lab 07/02/13 1704 07/02/13 2138 07/03/13 0750 07/03/13 1218 07/03/13 1641  GLUCAP 154* 137* 130* 143* 125*    Recent Results (from the past 240 hour(s))  CULTURE, BLOOD (ROUTINE X 2)     Status: None   Collection Time    06/23/13  8:45 PM      Result Value Range Status   Specimen Description BLOOD RIGHT ARM   Final   Special Requests BOTTLES DRAWN AEROBIC AND ANAEROBIC 5CC   Final   Culture  Setup Time     Final   Value: 06/24/2013 02:51     Performed at [facility]   Culture     Final   Value: NO GROWTH 5  DAYS     Performed at [facility]   Report Status 06/30/2013 FINAL   Final  URINE CULTURE     Status: None   Collection  Time    06/23/13  8:51 PM      Result Value Range Status   Specimen Description URINE, CLEAN CATCH   Final   Special Requests NONE   Final   Culture  Setup Time     Final   Value: 06/24/2013 03:32     Performed at [facility]   Colony Count     Final   Value: NO GROWTH     Performed at [facility]   Culture     Final   Value: NO GROWTH     Performed at [facility]   Report Status 06/25/2013 FINAL   Final  CULTURE, BLOOD (ROUTINE X 2)     Status: None   Collection Time    06/23/13  9:52 PM      Result Value Range Status   Specimen Description BLOOD RIGHT ANTECUBITAL   Final   Special Requests BOTTLES DRAWN AEROBIC AND ANAEROBIC 3CC   Final   Culture  Setup Time     Final   Value: 06/24/2013 03:45     Performed at [facility]   Culture     Final   Value: NO GROWTH 5 DAYS     Performed at [facility]   Report Status 06/30/2013 FINAL   Final  CLOSTRIDIUM DIFFICILE BY PCR     Status: None   Collection Time    06/24/13  1:44 AM      Result Value Range Status   C difficile by pcr NEGATIVE  NEGATIVE Final   Comment: Performed at [facility]   Studies: Ct Abdomen Pelvis Wo Contrast  07/02/2013   *RADIOLOGY REPORT*  Clinical Data: Small bowel obstruction.  Fever and chills.  CT ABDOMEN AND PELVIS WITHOUT CONTRAST  Technique:  Multidetector CT imaging of the abdomen and pelvis was performed following the standard protocol without intravenous contrast.  Comparison: None.  Findings: Moderate dilatation of small bowel is demonstrated, which contain air fluid levels.  There is also distention and air-fluid levels seen within the the nondependent transverse colon.  No transition point is seen.  This suggests an adynamic ileus rather than a small bowel obstruction.  There is no evidence of bowel wall thickening or  focal inflammatory process.  No evidence of abscess, free fluid, or free air.  Cholelithiasis is demonstrated, without evidence of cholecystitis. A feeding tube is seen with tip in the proximal duodenum.  Noncontrast images of the liver, spleen, pancreas, and adrenal glands are unremarkable.  A small right renal cyst noted, however kidneys otherwise unremarkable.  No evidence of hydronephrosis.  No soft tissue masses or lymphadenopathy identified.  IMPRESSION:  1.  Moderate dilatation of small bowel and nondependent transverse colon, without evidence of transition point.  This favors an adynamic ileus over small bowel obstruction. 2.  No mass or inflammatory process identified. 3. Cholelithiasis.  No radiographic evidence of cholecystitis.   Original Report Authenticated By: Myles Rosenthal, M.D.   Dg Abd 2 Views  07/02/2013   *RADIOLOGY REPORT*  Clinical Data: Evaluate small bowel dilatation.  ABDOMEN - 2 VIEW  Comparison: 07/01/2013.  Findings: Nasogastric tube is looped in the stomach with the tip seen proximally.  There are multiple dilated loops of small bowel with associated air fluid levels. Suspect some colonic gas in the right upper quadrant.  No rectal gas.  Calcified stones are seen in the right upper quadrant.  Visualized lung bases show prominent extrapleural fat along the lateral  aspect of the lower hemithoraces, versus loculated pleural fluid.  IMPRESSION:  1.  Persistent small bowel obstruction. 2.  Cholelithiasis.   Original Report Authenticated By: Leanna Battles, M.D.   Scheduled Meds: .  ceFAZolin (ANCEF) IV  1 g Intravenous Q8H  . insulin aspart  0-15 Units Subcutaneous TID WC  . pantoprazole (PROTONIX) IV  40 mg Intravenous Q24H  . simethicone  160 mg Oral QID  . sodium chloride  3 mL Intravenous Q12H  . sodium chloride  3 mL Intravenous Q12H  . Warfarin - Pharmacist Dosing Inpatient   Does not apply q1800   Continuous Infusions: . 0.9 % NaCl with KCl 20 mEq / L 125 mL/hr at 07/03/13  1500   Principal Problem:   Adynamic ileus Active Problems:   Fever   Cellulitis   CHF (congestive heart failure)   Atrial fibrillation   Diarrhea   HTN (hypertension)   Diabetes mellitus   Hyperlipidemia   ARF (acute renal failure)   Unspecified intestinal obstruction   Nonspecific (abnormal) findings on radiological and other examination of gastrointestinal tract  Time spent: 35  Pamella Pert, MD [facility] Pager [phone]. If 7 PM - 7 AM, please contact night-coverage at [email or web address], password [facility] 07/03/2013, 5:14 PM  LOS: 10 days

## 2013-07-04 ENCOUNTER — Inpatient Hospital Stay (HOSPITAL_COMMUNITY): Payer: MEDICARE

## 2013-07-04 LAB — CBC
HCT: 32.9 % — ABNORMAL LOW (ref 39.0–52.0)
MCH: 31.3 pg (ref 26.0–34.0)
MCV: 93.5 fL (ref 78.0–100.0)
Platelets: 335 10*3/uL (ref 150–400)
RDW: 14.7 % (ref 11.5–15.5)

## 2013-07-04 LAB — BASIC METABOLIC PANEL
BUN: 27 mg/dL — ABNORMAL HIGH (ref 6–23)
CO2: 27 mEq/L (ref 19–32)
Calcium: 8.3 mg/dL — ABNORMAL LOW (ref 8.4–10.5)
Chloride: 102 mEq/L (ref 96–112)
Creatinine, Ser: 1.5 mg/dL — ABNORMAL HIGH (ref 0.50–1.35)
Glucose, Bld: 133 mg/dL — ABNORMAL HIGH (ref 70–99)

## 2013-07-04 LAB — GLUCOSE, CAPILLARY
Glucose-Capillary: 135 mg/dL — ABNORMAL HIGH (ref 70–99)
Glucose-Capillary: 142 mg/dL — ABNORMAL HIGH (ref 70–99)

## 2013-07-04 MED ORDER — HYDROCORTISONE 2.5 % RE CREA
TOPICAL_CREAM | Freq: Two times a day (BID) | RECTAL | Status: DC
  Administered 2013-07-04 – 2013-07-07 (×6): via RECTAL
  Filled 2013-07-04: qty 28.35

## 2013-07-04 MED ORDER — RESOURCE INSTANT PROTEIN PO PWD PACKET
2.0000 | Freq: Three times a day (TID) | ORAL | Status: DC
  Administered 2013-07-04 – 2013-07-06 (×3): 12 g via ORAL
  Filled 2013-07-04 (×11): qty 12

## 2013-07-04 MED ORDER — ADULT MULTIVITAMIN W/MINERALS CH
1.0000 | ORAL_TABLET | Freq: Every day | ORAL | Status: DC
  Administered 2013-07-04 – 2013-07-08 (×5): 1 via ORAL
  Filled 2013-07-04 (×5): qty 1

## 2013-07-04 NOTE — Progress Notes (Signed)
[facility] PROGRESS NOTE  Edwin [name] BJY:[id number] DOB: 16-Feb-1941 DOA: 06/23/2013 PCP: Aura Dials, MD  Assessment/Plan: SBO: abd distension/bloating, now improved s/p NG tube - GI/Surgery following.   - subjectively improving, able to pass flatus - clamping trial today SIRS/Right lower extremity cellulitis.  - Significant improvement now. S/p Vancomycin for 5 days, now on Ancef, day 10/10. Blood Cx NGTD - dopplers negative for DVT  AKI - may be multifactorial, can be due to diuresis superimposed on significant GI losses with 6L output from NG tube. - d/c lasix and provide supportive hydration - improving, continue to monitor.  CHF - acute on chronic likely diastolic, decrease IV fluids today  - D/C IV lasix 80mg  q12, - 2D ECHO with EF of 55-60%. No wall motion abnormalities. Left atrium is severely dilated.  - unable to get acurate I/Os. Follow.  Atrial fibrillation  - rate controlled currently. Continue Coumadin per pharmacy, INR therapeutic. Beta blocker on hold secondary to bradycardia. May need to resume a low dose if needed for heart rate Diabetes mellitus type 2  - closely follow CBGs with sliding-scale, hold metformin, AIC 7.9 Hypertension  - D/C'd Lopressor secondary to bradycardia. Blood pressure stable follow Hypokalemia-replace  - bmet in am  Morbid obesity  Asymptomatic bradycardia  - Patient noted to have bradycardia in the 30s while sleeping however improve on awakening. Per nursing patient with some 2 second pauses. Bradycardia improved off Lopressor. Follow.   Code Status: Full Family Communication: none  Disposition Plan: home when medically ready  Consultants:  GI  Surgery  Procedures:  none  Anti-infectives   Start     Dose/Rate Route Frequency Ordered Stop   06/28/13 1600  ceFAZolin (ANCEF) IVPB 1 g/50 mL premix     1 g 100 mL/hr over 30 Minutes Intravenous 3 times per day 06/28/13 1548     06/24/13 1000  vancomycin (VANCOCIN) 1,500  mg in sodium chloride 0.9 % 500 mL IVPB  Status:  Discontinued     1,500 mg 250 mL/hr over 120 Minutes Intravenous Every 12 hours 06/23/13 1959 06/28/13 1547   06/24/13 0600  piperacillin-tazobactam (ZOSYN) IVPB 3.375 g  Status:  Discontinued     3.375 g 12.5 mL/hr over 240 Minutes Intravenous 3 times per day 06/23/13 1949 06/26/13 0830   06/23/13 2200  vancomycin (VANCOCIN) 2,000 mg in sodium chloride 0.9 % 500 mL IVPB     2,000 mg 250 mL/hr over 120 Minutes Intravenous  Once 06/23/13 1959 06/24/13 0005   06/23/13 2000  piperacillin-tazobactam (ZOSYN) IVPB 3.375 g     3.375 g 100 mL/hr over 30 Minutes Intravenous  Once 06/23/13 1949 06/23/13 2322     Antibiotics Given (last 72 hours)   Date/Time Action Medication Dose Rate   07/01/13 1617 Given   ceFAZolin (ANCEF) IVPB 1 g/50 mL premix 1 g 100 mL/hr   07/01/13 2121 Given   ceFAZolin (ANCEF) IVPB 1 g/50 mL premix 1 g 100 mL/hr   07/02/13 0546 Given   ceFAZolin (ANCEF) IVPB 1 g/50 mL premix 1 g 100 mL/hr   07/02/13 1659 Given   ceFAZolin (ANCEF) IVPB 1 g/50 mL premix 1 g 100 mL/hr   07/02/13 2346 Given   ceFAZolin (ANCEF) IVPB 1 g/50 mL premix 1 g 100 mL/hr   07/03/13 0622 Given   ceFAZolin (ANCEF) IVPB 1 g/50 mL premix 1 g 100 mL/hr   07/03/13 1325 Given   ceFAZolin (ANCEF) IVPB 1 g/50 mL premix 1 g 100 mL/hr  07/03/13 2156 Given   ceFAZolin (ANCEF) IVPB 1 g/50 mL premix 1 g 100 mL/hr   07/04/13 0517 Given   ceFAZolin (ANCEF) IVPB 1 g/50 mL premix 1 g 100 mL/hr     HPI/Subjective: - subjectively feeling better  Objective: Filed Vitals:   07/03/13 0609 07/03/13 1410 07/03/13 2153 07/04/13 0622  BP: 109/51 115/66 113/61 107/68  Pulse: 69 79 76 65  Temp: 97.5 F (36.4 C) 97.3 F (36.3 C) 97.7 F (36.5 C) 97.6 F (36.4 C)  TempSrc: Axillary Axillary Axillary Axillary  Resp: 20 21 20 16   Height:      Weight: 153.769 kg (339 lb)   153.6 kg (338 lb 10 oz)  SpO2: 99% 96% 97% 99%    Intake/Output Summary (Last 24  hours) at 07/04/13 1013 Last data filed at 07/04/13 0452  Gross per 24 hour  Intake 2056.25 ml  Output   2500 ml  Net -443.75 ml   Filed Weights   07/02/13 0500 07/03/13 0609 07/04/13 0622  Weight: 154.7 kg (341 lb 0.8 oz) 153.769 kg (339 lb) 153.6 kg (338 lb 10 oz)    Exam:  General:  NAD, NG tube in place  Cardiovascular: regular rate and rhythm, without MRG  Respiratory: good air movement, clear to auscultation throughout, no wheezing, ronchi or rales  Abdomen: soft, not tender to palpation, positive bowel sounds  MSK: chronic venous stasis changes bilateral LE  Neuro: non focal  Data Reviewed: Basic Metabolic Panel:  Recent Labs Lab 06/28/13 0513  06/30/13 0410 07/01/13 0420 07/01/13 1835 07/02/13 0455 07/03/13 0521 07/04/13 0430  NA 135  < > 136 130* 131* 130* 134* 135  K 4.2  < > 2.8* 3.4* 3.5 3.1* 3.0* 3.6  CL 98  < > 97 93* 93* 94* 98 102  CO2 29  < > 29 27 23 25 26 27   GLUCOSE 180*  < > 146* 224* 194* 185* 154* 133*  BUN 16  < > 13 16 26* 33* 42* 27*  CREATININE 1.33  < > 1.20 1.95* 3.09* 3.06* 2.33* 1.50*  CALCIUM 8.9  < > 8.5 9.0 8.6 8.7 8.7 8.3*  MG 2.2  --  2.0  --   --   --   --   --   < > = values in this interval not displayed.  CBC:  Recent Labs Lab 06/28/13 0513 06/30/13 0410 07/03/13 0521 07/04/13 0430  WBC 10.3 8.0 9.5 9.7  HGB 13.2 12.6* 11.6* 11.0*  HCT 38.7* 37.3* 33.6* 32.9*  MCV 94.4 93.5 93.1 93.5  PLT 221 260 328 335   BNP (last 3 results)  Recent Labs  06/23/13 1832  PROBNP 2292.0*   CBG:  Recent Labs Lab 07/03/13 0750 07/03/13 1218 07/03/13 1641 07/03/13 2150 07/04/13 0736  GLUCAP 130* 143* 125* 129* 114*    No results found for this or any previous visit (from the past 240 hour(s)). Studies: Ct Abdomen Pelvis Wo Contrast  07/02/2013   *RADIOLOGY REPORT*  Clinical Data: Small bowel obstruction.  Fever and chills.  CT ABDOMEN AND PELVIS WITHOUT CONTRAST  Technique:  Multidetector CT imaging of the abdomen  and pelvis was performed following the standard protocol without intravenous contrast.  Comparison: None.  Findings: Moderate dilatation of small bowel is demonstrated, which contain air fluid levels.  There is also distention and air-fluid levels seen within the the nondependent transverse colon.  No transition point is seen.  This suggests an adynamic ileus rather than a small bowel  obstruction.  There is no evidence of bowel wall thickening or focal inflammatory process.  No evidence of abscess, free fluid, or free air.  Cholelithiasis is demonstrated, without evidence of cholecystitis. A feeding tube is seen with tip in the proximal duodenum.  Noncontrast images of the liver, spleen, pancreas, and adrenal glands are unremarkable.  A small right renal cyst noted, however kidneys otherwise unremarkable.  No evidence of hydronephrosis.  No soft tissue masses or lymphadenopathy identified.  IMPRESSION:  1.  Moderate dilatation of small bowel and nondependent transverse colon, without evidence of transition point.  This favors an adynamic ileus over small bowel obstruction. 2.  No mass or inflammatory process identified. 3. Cholelithiasis.  No radiographic evidence of cholecystitis.   Original Report Authenticated By: Myles Rosenthal, M.D.   Dg Abd 1 View  07/04/2013   *RADIOLOGY REPORT*  Clinical Data: Small bowel dilatation  ABDOMEN - 1 VIEW  Comparison: 07/02/2013  Findings: A nasogastric catheter is seen within the stomach.  There are persistent dilated loops of small bowel identified within the mid abdomen.  No free air is seen.  No abnormal mass or abnormal calcifications are noted.  The degree of small bowel dilatation is roughly stable from prior exam.  IMPRESSION: No significant interval change in the small bowel dilatation.   Original Report Authenticated By: Alcide Clever, M.D.   Scheduled Meds: .  ceFAZolin (ANCEF) IV  1 g Intravenous Q8H  . hydrocortisone   Rectal BID  . insulin aspart  0-15 Units  Subcutaneous TID WC  . pantoprazole (PROTONIX) IV  40 mg Intravenous Q24H  . simethicone  160 mg Oral QID  . sodium chloride  3 mL Intravenous Q12H  . sodium chloride  3 mL Intravenous Q12H  . Warfarin - Pharmacist Dosing Inpatient   Does not apply q1800   Continuous Infusions: . 0.9 % NaCl with KCl 20 mEq / L 100 mL/hr at 07/04/13 1004   Principal Problem:   Adynamic ileus Active Problems:   Fever   Cellulitis   CHF (congestive heart failure)   Atrial fibrillation   Diarrhea   HTN (hypertension)   Diabetes mellitus   Hyperlipidemia   ARF (acute renal failure)   Unspecified intestinal obstruction   Nonspecific (abnormal) findings on radiological and other examination of gastrointestinal tract  Time spent: 25  Pamella Pert, MD [facility] Pager [phone]. If 7 PM - 7 AM, please contact night-coverage at [email or web address], password [facility] 07/04/2013, 10:13 AM  LOS: 11 days

## 2013-07-04 NOTE — Progress Notes (Signed)
Patient ID: Edwin [name], male   DOB: Jun 12, 1941, 72 y.o.   MRN: [id number]  Subjective: Pt states he felt great last night, best hes felt in a while.  Has several small bm's yesterday.  Now complaining of perirectal pain from hemorrhoids, states he has been on the toilet straining.  NGT clamping trials.  Objective:  Vital signs:  Filed Vitals:   07/03/13 0609 07/03/13 1410 07/03/13 2153 07/04/13 0622  BP: 109/51 115/66 113/61 107/68  Pulse: 69 79 76 65  Temp: 97.5 F (36.4 C) 97.3 F (36.3 C) 97.7 F (36.5 C) 97.6 F (36.4 C)  TempSrc: Axillary Axillary Axillary Axillary  Resp: 20 21 20 16   Height:      Weight: 339 lb (153.769 kg)   338 lb 10 oz (153.6 kg)  SpO2: 99% 96% 97% 99%    Last BM Date: 07/01/13  Intake/Output   Yesterday:  08/14 0701 - 08/15 0700 In: 2856.3 [P.O.:200; I.V.:1856.3; IV Piggyback:800] Out: 2500 [Emesis/NG output:2500]  Physical Exam: Constitutional: He is oriented to person, place, and time. He appears well-developed and well-nourished. No distress.  Cardiovascular: Normal heart sounds and intact distal pulses. Exam reveals no gallop and no friction rub.  No murmur heard. Regularly irregular  Respiratory: Effort normal and breath sounds normal. He has no wheezes. He exhibits no tenderness.  GI: Bowel sounds are active. He exhibits distension. No tenderness on palpation.   No guarding or evidence of peritonitis.  He has a small non thrombosed external hemorrhoid.  NGT is clamped.   Musculoskeletal: He exhibits no tenderness.  Neurological: He is alert and oriented to person, place, and time.  Skin: Skin is warm and dry. He is not diaphoretic. No pallor.  +2-3 pitting LE edema, erythema, chronic skin changes  Psychiatric: He has a normal mood and affect. His behavior is normal. Judgment and thought content normal.  Problem List:   Principal Problem:   Adynamic ileus Active Problems:   Fever   Cellulitis   CHF (congestive heart failure)    Atrial fibrillation   Diarrhea   HTN (hypertension)   Diabetes mellitus   Hyperlipidemia   ARF (acute renal failure)   Unspecified intestinal obstruction   Nonspecific (abnormal) findings on radiological and other examination of gastrointestinal tract    Results:   Labs: Results for orders placed during the hospital encounter of 06/23/13 (from the past 48 hour(s))  GLUCOSE, CAPILLARY     Status: Abnormal   Collection Time    07/02/13  7:40 AM      Result Value Range   Glucose-Capillary 183 (*) 70 - 99 mg/dL  GLUCOSE, CAPILLARY     Status: Abnormal   Collection Time    07/02/13 12:02 PM      Result Value Range   Glucose-Capillary 154 (*) 70 - 99 mg/dL  GLUCOSE, CAPILLARY     Status: Abnormal   Collection Time    07/02/13  5:04 PM      Result Value Range   Glucose-Capillary 154 (*) 70 - 99 mg/dL  GLUCOSE, CAPILLARY     Status: Abnormal   Collection Time    07/02/13  9:38 PM      Result Value Range   Glucose-Capillary 137 (*) 70 - 99 mg/dL  PROTIME-INR     Status: Abnormal   Collection Time    07/03/13  5:21 AM      Result Value Range   Prothrombin Time 38.9 (*) 11.6 - 15.2 seconds   INR 4.20 (*)  0.00 - 1.49  BASIC METABOLIC PANEL     Status: Abnormal   Collection Time    07/03/13  5:21 AM      Result Value Range   Sodium 134 (*) 135 - 145 mEq/L   Potassium 3.0 (*) 3.5 - 5.1 mEq/L   Chloride 98  96 - 112 mEq/L   CO2 26  19 - 32 mEq/L   Glucose, Bld 154 (*) 70 - 99 mg/dL   BUN 42 (*) 6 - 23 mg/dL   Creatinine, Ser 1.61 (*) 0.50 - 1.35 mg/dL   Calcium 8.7  8.4 - 09.6 mg/dL   GFR calc non Af Amer 26 (*) >90 mL/min   GFR calc Af Amer 31 (*) >90 mL/min   Comment: (NOTE)     The eGFR has been calculated using the CKD EPI equation.     This calculation has not been validated in all clinical situations.     eGFR's persistently <90 mL/min signify possible Chronic Kidney     Disease.  CBC     Status: Abnormal   Collection Time    07/03/13  5:21 AM      Result Value  Range   WBC 9.5  4.0 - 10.5 K/uL   RBC 3.61 (*) 4.22 - 5.81 MIL/uL   Hemoglobin 11.6 (*) 13.0 - 17.0 g/dL   HCT 04.5 (*) 40.9 - 81.1 %   MCV 93.1  78.0 - 100.0 fL   MCH 32.1  26.0 - 34.0 pg   MCHC 34.5  30.0 - 36.0 g/dL   RDW 91.4  78.2 - 95.6 %   Platelets 328  150 - 400 K/uL  GLUCOSE, CAPILLARY     Status: Abnormal   Collection Time    07/03/13  7:50 AM      Result Value Range   Glucose-Capillary 130 (*) 70 - 99 mg/dL  GLUCOSE, CAPILLARY     Status: Abnormal   Collection Time    07/03/13 12:18 PM      Result Value Range   Glucose-Capillary 143 (*) 70 - 99 mg/dL  GLUCOSE, CAPILLARY     Status: Abnormal   Collection Time    07/03/13  4:41 PM      Result Value Range   Glucose-Capillary 125 (*) 70 - 99 mg/dL  GLUCOSE, CAPILLARY     Status: Abnormal   Collection Time    07/03/13  9:50 PM      Result Value Range   Glucose-Capillary 129 (*) 70 - 99 mg/dL   Comment 1 Notify RN    PROTIME-INR     Status: Abnormal   Collection Time    07/04/13  4:30 AM      Result Value Range   Prothrombin Time 37.3 (*) 11.6 - 15.2 seconds   INR 3.98 (*) 0.00 - 1.49  BASIC METABOLIC PANEL     Status: Abnormal   Collection Time    07/04/13  4:30 AM      Result Value Range   Sodium 135  135 - 145 mEq/L   Potassium 3.6  3.5 - 5.1 mEq/L   Chloride 102  96 - 112 mEq/L   CO2 27  19 - 32 mEq/L   Glucose, Bld 133 (*) 70 - 99 mg/dL   BUN 27 (*) 6 - 23 mg/dL   Comment: DELTA CHECK NOTED   Creatinine, Ser 1.50 (*) 0.50 - 1.35 mg/dL   Comment: DELTA CHECK NOTED   Calcium 8.3 (*) 8.4 - 10.5 mg/dL  GFR calc non Af Amer 45 (*) >90 mL/min   GFR calc Af Amer 52 (*) >90 mL/min   Comment: (NOTE)     The eGFR has been calculated using the CKD EPI equation.     This calculation has not been validated in all clinical situations.     eGFR's persistently <90 mL/min signify possible Chronic Kidney     Disease.  CBC     Status: Abnormal   Collection Time    07/04/13  4:30 AM      Result Value Range   WBC  9.7  4.0 - 10.5 K/uL   RBC 3.52 (*) 4.22 - 5.81 MIL/uL   Hemoglobin 11.0 (*) 13.0 - 17.0 g/dL   HCT 40.9 (*) 81.1 - 91.4 %   MCV 93.5  78.0 - 100.0 fL   MCH 31.3  26.0 - 34.0 pg   MCHC 33.4  30.0 - 36.0 g/dL   RDW 78.2  95.6 - 21.3 %   Platelets 335  150 - 400 K/uL    Imaging / Studies: Ct Abdomen Pelvis Wo Contrast  07/02/2013   *RADIOLOGY REPORT*  Clinical Data: Small bowel obstruction.  Fever and chills.  CT ABDOMEN AND PELVIS WITHOUT CONTRAST  Technique:  Multidetector CT imaging of the abdomen and pelvis was performed following the standard protocol without intravenous contrast.  Comparison: None.  Findings: Moderate dilatation of small bowel is demonstrated, which contain air fluid levels.  There is also distention and air-fluid levels seen within the the nondependent transverse colon.  No transition point is seen.  This suggests an adynamic ileus rather than a small bowel obstruction.  There is no evidence of bowel wall thickening or focal inflammatory process.  No evidence of abscess, free fluid, or free air.  Cholelithiasis is demonstrated, without evidence of cholecystitis. A feeding tube is seen with tip in the proximal duodenum.  Noncontrast images of the liver, spleen, pancreas, and adrenal glands are unremarkable.  A small right renal cyst noted, however kidneys otherwise unremarkable.  No evidence of hydronephrosis.  No soft tissue masses or lymphadenopathy identified.  IMPRESSION:  1.  Moderate dilatation of small bowel and nondependent transverse colon, without evidence of transition point.  This favors an adynamic ileus over small bowel obstruction. 2.  No mass or inflammatory process identified. 3. Cholelithiasis.  No radiographic evidence of cholecystitis.   Original Report Authenticated By: Myles Rosenthal, M.D.   Dg Abd 2 Views  07/02/2013   *RADIOLOGY REPORT*  Clinical Data: Evaluate small bowel dilatation.  ABDOMEN - 2 VIEW  Comparison: 07/01/2013.  Findings: Nasogastric tube is  looped in the stomach with the tip seen proximally.  There are multiple dilated loops of small bowel with associated air fluid levels. Suspect some colonic gas in the right upper quadrant.  No rectal gas.  Calcified stones are seen in the right upper quadrant.  Visualized lung bases show prominent extrapleural fat along the lateral aspect of the lower hemithoraces, versus loculated pleural fluid.  IMPRESSION:  1.  Persistent small bowel obstruction. 2.  Cholelithiasis.   Original Report Authenticated By: Leanna Battles, M.D.    Medications / Allergies: per chart  Antibiotics: Anti-infectives   Start     Dose/Rate Route Frequency Ordered Stop   06/28/13 1600  ceFAZolin (ANCEF) IVPB 1 g/50 mL premix     1 g 100 mL/hr over 30 Minutes Intravenous 3 times per day 06/28/13 1548     06/24/13 1000  vancomycin (VANCOCIN) 1,500 mg in sodium  chloride 0.9 % 500 mL IVPB  Status:  Discontinued     1,500 mg 250 mL/hr over 120 Minutes Intravenous Every 12 hours 06/23/13 1959 06/28/13 1547   06/24/13 0600  piperacillin-tazobactam (ZOSYN) IVPB 3.375 g  Status:  Discontinued     3.375 g 12.5 mL/hr over 240 Minutes Intravenous 3 times per day 06/23/13 1949 06/26/13 0830   06/23/13 2200  vancomycin (VANCOCIN) 2,000 mg in sodium chloride 0.9 % 500 mL IVPB     2,000 mg 250 mL/hr over 120 Minutes Intravenous  Once 06/23/13 1959 06/24/13 0005   06/23/13 2000  piperacillin-tazobactam (ZOSYN) IVPB 3.375 g     3.375 g 100 mL/hr over 30 Minutes Intravenous  Once 06/23/13 1949 06/23/13 2322      Assessment/Plan  Ileus versus PSBO Appears more consistent with an ileus.  XR scheduled today. NGT clamping trials, on clears and tolerating.  Consider discontinuing NG, administering dulcolax suppository, starting on "good bowel regimen," and mobilizing.  His renal function is improving.  Please do not hesitate to contact [facility] with questions or concerns.  Ashok Norris, [facility] Surgery Pager  [phone] Office [phone]  07/04/2013 7:42 AM  I have seen and examined him.  I think that he is still very distended and not ready for NG to be clamped or removed.  I would keep NG to suction until abdomen is nondistended, xrays improve, and when he shows sign of bowel function.  He also continues to have large amount of bilious output from NG.  I would recommend continue NG to suction and continue with HOB elevated.

## 2013-07-04 NOTE — Progress Notes (Signed)
Physical Therapy Treatment Patient Details Name: Edwin [name] MRN: [id number] DOB: 1941/06/18 Today's Date: 07/04/2013 Time: 4098-1191 PT Time Calculation (min): 10 min  PT Assessment / Plan / Recommendation  History of Present Illness  Edwin [name] is a 72 y.o. male history of CHF, diabetes mellitus type 2, hyperlipidemia, A. fib on Coumadin presented with complaints of having fever chills and feeling weak and short of breath. Patient has been having these symptoms for last 2 days. Denies any chest pain or productive cough. Last 2 days patient has had diarrhea 3-4 episodes. Denies any vomiting. In the ER patient was found to have right lower extremity erythema actually up to mid thigh. Patient was found to be febrile and lactic acid was found to be elevated with elevated leukocyte counts. Patient was initially given 2 L normal saline bolus and started on vancomycin and Zosyn for cellulitis. Patient admitted with for further management.   PT Comments   Progressing with mobility. Pt states he walked last night around unit holding onto IV pole. Possible d/c Sun  Follow Up Recommendations  No PT follow up     Does the patient have the potential to tolerate intense rehabilitation     Barriers to Discharge        Equipment Recommendations  None recommended by PT    Recommendations for Other Services    Frequency Min 3X/week   Progress towards PT Goals Progress towards PT goals: Progressing toward goals  Plan Current plan remains appropriate    Precautions / Restrictions Precautions Precautions: Fall Restrictions Weight Bearing Restrictions: No   Pertinent Vitals/Pain No c/o pain    Mobility  Bed Mobility Bed Mobility: Not assessed Details for Bed Mobility Assistance: pt sleeps in recliner at home Transfers Transfers: Sit to Stand;Stand to Sit Sit to Stand: 6: Modified independent (Device/Increase time);From chair/3-in-1 Stand to Sit: 6: Modified independent (Device/Increase  time);To chair/3-in-1 Ambulation/Gait Ambulation/Gait Assistance: 5: Supervision Ambulation Distance (Feet): 500 Feet Assistive device: Rolling walker Gait Pattern: Step-through pattern    Exercises     PT Diagnosis:    PT Problem List:   PT Treatment Interventions:     PT Goals (current goals can now be found in the care plan section)    Visit Information  Last PT Received On: 07/04/13 Assistance Needed: +1 History of Present Illness:  Edwin [name] is a 72 y.o. male history of CHF, diabetes mellitus type 2, hyperlipidemia, A. fib on Coumadin presented with complaints of having fever chills and feeling weak and short of breath. Patient has been having these symptoms for last 2 days. Denies any chest pain or productive cough. Last 2 days patient has had diarrhea 3-4 episodes. Denies any vomiting. In the ER patient was found to have right lower extremity erythema actually up to mid thigh. Patient was found to be febrile and lactic acid was found to be elevated with elevated leukocyte counts. Patient was initially given 2 L normal saline bolus and started on vancomycin and Zosyn for cellulitis. Patient admitted with for further management.    Subjective Data      Cognition  Cognition Arousal/Alertness: Awake/alert Behavior During Therapy: WFL for tasks assessed/performed Overall Cognitive Status: Within Functional Limits for tasks assessed    Balance     End of Session PT - End of Session Activity Tolerance: Patient tolerated treatment well Patient left: in chair;with call bell/phone within reach   GP     Rebeca Alert, MPT Pager: [phone]

## 2013-07-04 NOTE — Progress Notes (Signed)
INITIAL NUTRITION ASSESSMENT  DOCUMENTATION CODES Per approved criteria  -Morbid Obesity   INTERVENTION: Diet advancement per MD discretion Provide Beneprotein TID with meals Provide Multivitamin with minerals daily RD will follow-up to provide nutrition education for diabetes and wt loss prior to discharge Recommend referring pt to outpatient dietitian  NUTRITION DIAGNOSIS: Inadequate oral intake related to restricted diet as evidenced by pt being NPO or on clear/full liquid diet since 8/8.   Goal: Pt to meet >/= 90% of their estimated nutrition needs   Monitor:  Diet advancement Po intake Weight Labs  Reason for Assessment: Length of Stay  72 y.o. male  Admitting Dx: Adynamic ileus  ASSESSMENT: 72 y.o. male history of CHF, diabetes mellitus type 2, hyperlipidemia, A. fib on Coumadin presented with complaints of having fever chills and feeling weak and short of breath. Patient admitted with lower extremity cellulitis and found to have abdominal distention / ileus on imaging studies. Pt has been on a restricted diet, NPO, Clear liquids, or full liquids, since 8/8 and is currently on clear liquid diet. Pt states he was eating well PTA and his appetite is good. Pt reports that he usually weighs 295 lbs but, he started gaining weight and doesn't know why. Pt reports that his hemoglobin A1c was 6.5 one year ago; now it is 7.9. Pt reports that he was drinking 5-6 alcoholic beverages every night PTA; pt states he is going to stop drinking and wants to eat better. RD will follow-up closer to discharge to provide pt with diet education for wt loss and diabetes management.    Height: Ht Readings from Last 1 Encounters:  06/24/13 5\' 11"  (1.803 m)    Weight: Wt Readings from Last 1 Encounters:  07/04/13 338 lb 10 oz (153.6 kg)    Ideal Body Weight: 172 lbs  % Ideal Body Weight: 197%  Wt Readings from Last 10 Encounters:  07/04/13 338 lb 10 oz (153.6 kg)  04/18/13 348 lb 12.8  oz (158.215 kg)  04/10/13 360 lb (163.295 kg)  04/10/13 360 lb (163.295 kg)  10/26/12 328 lb 11.2 oz (149.097 kg)  06/28/11 325 lb (147.419 kg)    Usual Body Weight: 295 lbs  % Usual Body Weight: 115%  BMI:  Body mass index is 47.25 kg/(m^2).  Estimated Nutritional Needs: Kcal: 2300-2500 Protein: 120-130 grams Fluid: 3.5 L  Skin: +3 RLE edema, +2 LLE edema; cellulitis of right and left leg  Diet Order: Clear Liquid  EDUCATION NEEDS: -Education not appropriate at this time   Intake/Output Summary (Last 24 hours) at 07/04/13 1034 Last data filed at 07/04/13 0452  Gross per 24 hour  Intake 2056.25 ml  Output   2500 ml  Net -443.75 ml    Last BM: 8/12  Labs:   Recent Labs Lab 06/28/13 0513  06/30/13 0410  07/02/13 0455 07/03/13 0521 07/04/13 0430  NA 135  < > 136  < > 130* 134* 135  K 4.2  < > 2.8*  < > 3.1* 3.0* 3.6  CL 98  < > 97  < > 94* 98 102  CO2 29  < > 29  < > 25 26 27   BUN 16  < > 13  < > 33* 42* 27*  CREATININE 1.33  < > 1.20  < > 3.06* 2.33* 1.50*  CALCIUM 8.9  < > 8.5  < > 8.7 8.7 8.3*  MG 2.2  --  2.0  --   --   --   --  GLUCOSE 180*  < > 146*  < > 185* 154* 133*  < > = values in this interval not displayed.  CBG (last 3)   Recent Labs  07/03/13 1641 07/03/13 2150 07/04/13 0736  GLUCAP 125* 129* 114*    Scheduled Meds: .  ceFAZolin (ANCEF) IV  1 g Intravenous Q8H  . hydrocortisone   Rectal BID  . insulin aspart  0-15 Units Subcutaneous TID WC  . pantoprazole (PROTONIX) IV  40 mg Intravenous Q24H  . simethicone  160 mg Oral QID  . sodium chloride  3 mL Intravenous Q12H  . sodium chloride  3 mL Intravenous Q12H  . Warfarin - Pharmacist Dosing Inpatient   Does not apply q1800    Continuous Infusions: . 0.9 % NaCl with KCl 20 mEq / L 100 mL/hr at 07/04/13 1004    Past Medical History  Diagnosis Date  . Hypertension     benign essential hypertension  . Diabetes mellitus     NIDDM  . Morbid obesity   . Pure  hypercholesterolemia   . Long-term (current) use of anticoagulants   . Varicose veins of legs   . Pulmonary hypertension MODERATE  . Cor pulmonale, chronic   . Chronic right-sided CHF (congestive heart failure)   . Atrial fibrillation CARDIOLOGIST-- DR Jacinto Halim  . Anxiety   . Shortness of breath     with exertion   . Arthritis     Past Surgical History  Procedure Laterality Date  . Cardiovascular stress test  07-19-2012  DR Jacinto Halim    PROMINENT DIAPHRAGMATIC ATTENUATION/ NORMAL LVEF/ LOW RISK STUDY  . Transthoracic echocardiogram  05-16-2012    LOW NORMAL LVEF/ MOD. RV/ MILD HYPOKINESIS/ MOD. PULMONARY HTN/ CHRONIC COR PUMONALE/ NO SIG. CHANGE FROM 12-01-2010  . Mouth surgery    . Benign mole removal from nose     . Knee arthroscopy with medial menisectomy Left 04/23/2013    Procedure: LEFT KNEE ARTHROSCOPY WITH PARTIAL MEDIAL MENISECTOMY;  Surgeon: Drucilla Schmidt, MD;  Location: [facility];  Service: Orthopedics;  Laterality: Left;  . Cholecystectomy    . Colon surgery    . Small intestine surgery      Ian Malkin RD, LDN Inpatient Clinical Dietitian Pager: [phone] After Hours Pager: [phone]

## 2013-07-04 NOTE — Progress Notes (Signed)
1430 Aldous had 2.14 sec pause around 0330 AM. Craige Cotta, NP on call, made aware. No new orders. Will continue to monitor.

## 2013-07-04 NOTE — Progress Notes (Signed)
[facility] Progress Note   Subjective  Woke up with shaking chill last night. No nausea with NGT clamping. No abdominal pain. Not having much in way of BMs but passing some flatus   Objective   Vital signs in last 24 hours: Temp:  [97.3 F (36.3 C)-97.7 F (36.5 C)] 97.6 F (36.4 C) 2023-07-14 0622) Pulse Rate:  [65-79] 65 2023-07-14 0622) Resp:  [16-21] 16 07-14-23 0622) BP: (107-115)/(61-68) 107/68 mmHg 2023-07-14 0622) SpO2:  [96 %-99 %] 99 % 2023/07/14 0622) Weight:  [338 lb 10 oz (153.6 kg)] 338 lb 10 oz (153.6 kg) 07/14/23 0622) Last BM Date: 07/01/13 General:    White male in NAD Abdomen:  Soft, obese, nontender. Can't really tell if / how much distention present given morbid obesity.  Hypoactive (but a few) bowel sounds. Some of the bowel sounds are normal but there are a few abnormal ones as well.  .Neurologic:  Alert and oriented,  grossly normal neurologically. Psych:  Cooperative. Normal mood and affect.  Lab Results:  Recent Labs  07/03/13 0521 07-13-13 0430  WBC 9.5 9.7  HGB 11.6* 11.0*  HCT 33.6* 32.9*  PLT 328 335   BMET  Recent Labs  07/02/13 0455 07/03/13 0521 13-Jul-2013 0430  NA 130* 134* 135  K 3.1* 3.0* 3.6  CL 94* 98 102  CO2 25 26 27   GLUCOSE 185* 154* 133*  BUN 33* 42* 27*  CREATININE 3.06* 2.33* 1.50*  CALCIUM 8.7 8.7 8.3*   PT/INR  Recent Labs  07/03/13 0521 Jul 13, 2013 0430  LABPROT 38.9* 37.3*  INR 4.20* 3.98*    Studies/Results:  Dg Abd 1 View  2013-07-13   *RADIOLOGY REPORT*  Clinical Data: Small bowel dilatation  ABDOMEN - 1 VIEW  Comparison: 07/02/2013  Findings: A nasogastric catheter is seen within the stomach.  There are persistent dilated loops of small bowel identified within the mid abdomen.  No free air is seen.  No abnormal mass or abnormal calcifications are noted.  The degree of small bowel dilatation is roughly stable from prior exam.  IMPRESSION: No significant interval change in the small bowel dilatation.   Original  Report Authenticated By: Alcide Clever, M.D.     Assessment / Plan:   1. Ileus vrs small bowel obstruction of unclear etiology. No change in small bowel dilation on KUB today but tolerating intermittent NGT clamping. Taking clears. Will leave NGT clamped today and see how he tolerates.    2. Acute renal failure likely volume depletion. Improved after stopping diuretics, giving fluid bolus and increasing IVF rate. Creatinine down to 1.5 today. Will decrease IVF rate slightly since NGT will be clamped and he is taking clears.   3. Hypokalemia, resolved after several K+ runs and addition of K+ to IVF.   4. Multiple medical problems. On chronic coumadin which was held yesterday secondary to supratherapeutic INR   LOS: 11 days   Willette Cluster  July 13, 2013, 8:57 AM Attending MD note:   I have reviewed the above note, examined the patient and I have spoken to his wife. He has been takinf full liquids while NG tube has been clamped.Abdomen soft, active bowl sounds. KUB shows ?chronic SBowl dilation, probably long standing. Plan is to keep advancing the diet and pull out the NG tube tonight if liquids tolerated and put him on soft diet tomorrow. Dr Elnoria Howard is on call for [place], will see if any complications  [facility] Pager

## 2013-07-04 NOTE — Progress Notes (Signed)
ANTICOAGULATION CONSULT NOTE  Pharmacy Consult for Warfarin Indication: atrial fibrillation  Allergies  Allergen Reactions  . Lisinopril Swelling    Tongue swelling    Patient Measurements: Height: 5\' 11"  (180.3 cm) Weight: 338 lb 10 oz (153.6 kg) IBW/kg (Calculated) : 75.3  Vital Signs: Temp: 97.6 F (36.4 C) (08/15 0622) Temp src: Axillary (08/15 0622) BP: 107/68 mmHg (08/15 0622) Pulse Rate: 65 (08/15 0622)  Labs:  Recent Labs  07/02/13 0455 07/03/13 0521 07/04/13 0430  HGB  --  11.6* 11.0*  HCT  --  33.6* 32.9*  PLT  --  328 335  LABPROT 33.2* 38.9* 37.3*  INR 3.42* 4.20* 3.98*  CREATININE 3.06* 2.33* 1.50*    Estimated Creatinine Clearance: 68.1 ml/min (by C-G formula based on Cr of 1.5).  Medications:  Scheduled:  .  ceFAZolin (ANCEF) IV  1 g Intravenous Q8H  . hydrocortisone   Rectal BID  . insulin aspart  0-15 Units Subcutaneous TID WC  . pantoprazole (PROTONIX) IV  40 mg Intravenous Q24H  . simethicone  160 mg Oral QID  . sodium chloride  3 mL Intravenous Q12H  . sodium chloride  3 mL Intravenous Q12H  . Warfarin - Pharmacist Dosing Inpatient   Does not apply q1800   Infusions:  . 0.9 % NaCl with KCl 20 mEq / L 125 mL/hr at 07/04/13 0248    Assessment: 72 yo Edwin [name] admitted 06/23/13, on chronic anticoagulation with warfarin for A-fib. Patient started on Lovenox bridge on 8/6 d/t subtherapeutic INR and active Afib; Lovenox d/c'd 8/9 when INR was therapeutic. Home warfarin dose reported as 5mg  MWFSun and 2.5mg  TuThSat Patient initially receivied larger warfarin doses than usual in hospital due to subtherapeutic INR on admission  INR supratherapeutic but improved from yesterday, likely from decreased po intake with SBO vs ileus Since no visible bleeding reported, no indication for vitamin K at this time  Goal of Therapy:  INR 2-3 Monitor platelets by anticoagulation protocol: Yes   Plan:   No warfarin tonight, resume once INR <3   Daily  PT/INR  Loralee Pacas, PharmD, BCPS Pager: [phone] 7:47 AM 07/04/2013

## 2013-07-05 LAB — BASIC METABOLIC PANEL
BUN: 15 mg/dL (ref 6–23)
Creatinine, Ser: 1.17 mg/dL (ref 0.50–1.35)
GFR calc Af Amer: 71 mL/min — ABNORMAL LOW (ref >90)
GFR calc non Af Amer: 61 mL/min — ABNORMAL LOW (ref >90)
Potassium: 3.4 mEq/L — ABNORMAL LOW (ref 3.5–5.1)

## 2013-07-05 LAB — PROTIME-INR
INR: 3.74 — ABNORMAL HIGH (ref 0.00–1.49)
Prothrombin Time: 35.6 seconds — ABNORMAL HIGH (ref 11.6–15.2)

## 2013-07-05 MED ORDER — MORPHINE SULFATE 2 MG/ML IJ SOLN
1.0000 mg | INTRAMUSCULAR | Status: DC | PRN
  Administered 2013-07-05 – 2013-07-07 (×6): 1 mg via INTRAVENOUS
  Filled 2013-07-05 (×6): qty 1

## 2013-07-05 NOTE — Progress Notes (Signed)
ANTICOAGULATION CONSULT NOTE  Pharmacy Consult for Warfarin Indication: atrial fibrillation  Allergies  Allergen Reactions  . Lisinopril Swelling    Tongue swelling    Patient Measurements: Height: 5\' 11"  (180.3 cm) Weight: 339 lb 3.2 oz (153.86 kg) IBW/kg (Calculated) : 75.3  Vital Signs: Temp: 98.5 F (36.9 C) (08/16 0432) Temp src: Axillary (08/16 0432) BP: 126/65 mmHg (08/16 0432) Pulse Rate: 71 (08/16 0432)  Labs:  Recent Labs  07/03/13 0521 07/04/13 0430 07/05/13 0535  HGB 11.6* 11.0*  --   HCT 33.6* 32.9*  --   PLT 328 335  --   LABPROT 38.9* 37.3* 35.6*  INR 4.20* 3.98* 3.74*  CREATININE 2.33* 1.50* 1.17    Estimated Creatinine Clearance: 87.4 ml/min (by C-G formula based on Cr of 1.17).  Medications:  Scheduled:  . hydrocortisone   Rectal BID  . insulin aspart  0-15 Units Subcutaneous TID WC  . multivitamin with minerals  1 tablet Oral Daily  . pantoprazole (PROTONIX) IV  40 mg Intravenous Q24H  . protein supplement  2 scoop Oral TID WC  . simethicone  160 mg Oral QID  . sodium chloride  3 mL Intravenous Q12H  . sodium chloride  3 mL Intravenous Q12H  . Warfarin - Pharmacist Dosing Inpatient   Does not apply q1800   Infusions:  . 0.9 % NaCl with KCl 20 mEq / L 100 mL/hr at 07/05/13 4098    Assessment: 72 yo M admitted 06/23/13, on chronic anticoagulation with warfarin for A-fib. Patient started on Lovenox bridge on 8/6 d/t subtherapeutic INR and active Afib; Lovenox d/c'd 8/9 when INR was therapeutic. Home warfarin dose reported as 5mg  MWFSun and 2.5mg  TuThSat Patient initially receivied larger warfarin doses than usual in hospital due to subtherapeutic INR on admission  INR supratherapeutic at 3.74 but improved from yesterday, likely from decreased po intake with SBO vs ileus (last dose 8/12) Since no visible bleeding reported, no indication for vitamin K at this time  Goal of Therapy:  INR 2-3 Monitor platelets by anticoagulation protocol:  Yes   Plan:   No warfarin tonight, resume once INR <3   Daily PT/INR  Juliette Alcide, PharmD, BCPS.   Pager: [phone] 10:45 AM 07/05/2013

## 2013-07-05 NOTE — Progress Notes (Signed)
[facility] PROGRESS NOTE  Edwin [name] WGN:[id number] DOB: June 25, 1941 DOA: 06/23/2013 PCP: Aura Dials, MD  Assessment/Plan: SBO: abd distension/bloating, now improved s/p NG tube - GI/Surgery following.    - no nausea, had a BM. NG tube discontinued SIRS/Right lower extremity cellulitis.  - Significant improvement now. S/p Vancomycin for 5 days, now on Ancef, day 10/10. Blood Cx NGTD - dopplers negative for DVT  - resolved AKI - may be multifactorial, can be due to diuresis superimposed on significant GI losses with 6L output from NG tube. - d/c lasix and provide supportive hydration - improved, close to normal now. CHF - acute on chronic likely diastolic, - D/C IV lasix 80mg  q12, - 2D ECHO with EF of 55-60%. No wall motion abnormalities. Left atrium is severely dilated.  - unable to get acurate I/Os. Follow.  Atrial fibrillation  - rate controlled currently. Continue Coumadin per pharmacy, INR therapeutic. Beta blocker on hold secondary to bradycardia. May need to resume a low dose if needed for heart rate Diabetes mellitus type 2  - closely follow CBGs with sliding-scale, hold metformin, AIC 7.9 Hypertension  - D/C'd Lopressor secondary to bradycardia. Blood pressure stable follow Hypokalemia-replace  - bmet in am  Morbid obesity  Asymptomatic bradycardia  - Patient noted to have bradycardia in the 30s while sleeping however improve on awakening. Per nursing patient with some 2 second pauses. Bradycardia improved off Lopressor. Follow.   Code Status: Full Family Communication: none  Disposition Plan: home when medically ready  Consultants:  GI  Surgery  Procedures:  none  Anti-infectives   Start     Dose/Rate Route Frequency Ordered Stop   06/28/13 1600  ceFAZolin (ANCEF) IVPB 1 g/50 mL premix  Status:  Discontinued     1 g 100 mL/hr over 30 Minutes Intravenous 3 times per day 06/28/13 1548 07/04/13 1421   06/24/13 1000  vancomycin (VANCOCIN) 1,500 mg in  sodium chloride 0.9 % 500 mL IVPB  Status:  Discontinued     1,500 mg 250 mL/hr over 120 Minutes Intravenous Every 12 hours 06/23/13 1959 06/28/13 1547   06/24/13 0600  piperacillin-tazobactam (ZOSYN) IVPB 3.375 g  Status:  Discontinued     3.375 g 12.5 mL/hr over 240 Minutes Intravenous 3 times per day 06/23/13 1949 06/26/13 0830   06/23/13 2200  vancomycin (VANCOCIN) 2,000 mg in sodium chloride 0.9 % 500 mL IVPB     2,000 mg 250 mL/hr over 120 Minutes Intravenous  Once 06/23/13 1959 06/24/13 0005   06/23/13 2000  piperacillin-tazobactam (ZOSYN) IVPB 3.375 g     3.375 g 100 mL/hr over 30 Minutes Intravenous  Once 06/23/13 1949 06/23/13 2322     Antibiotics Given (last 72 hours)   Date/Time Action Medication Dose Rate   07/02/13 1659 Given   ceFAZolin (ANCEF) IVPB 1 g/50 mL premix 1 g 100 mL/hr   07/02/13 2346 Given   ceFAZolin (ANCEF) IVPB 1 g/50 mL premix 1 g 100 mL/hr   07/03/13 0622 Given   ceFAZolin (ANCEF) IVPB 1 g/50 mL premix 1 g 100 mL/hr   07/03/13 1325 Given   ceFAZolin (ANCEF) IVPB 1 g/50 mL premix 1 g 100 mL/hr   07/03/13 2156 Given   ceFAZolin (ANCEF) IVPB 1 g/50 mL premix 1 g 100 mL/hr   07/04/13 0517 Given   ceFAZolin (ANCEF) IVPB 1 g/50 mL premix 1 g 100 mL/hr   07/04/13 1435 Given   ceFAZolin (ANCEF) IVPB 1 g/50 mL premix 1 g 100 mL/hr  HPI/Subjective: - feeling well, happy that NG tube is out  Objective: Filed Vitals:   07/04/13 1446 07/04/13 2145 07/05/13 0432 07/05/13 1440  BP: 118/65 138/78 126/65 138/63  Pulse: 71 67 71 87  Temp:  98.3 F (36.8 C) 98.5 F (36.9 C) 97.5 F (36.4 C)  TempSrc:  Oral Axillary Oral  Resp: 16 24 16 20   Height:      Weight:   153.86 kg (339 lb 3.2 oz)   SpO2: 100% 100% 97% 95%    Intake/Output Summary (Last 24 hours) at 07/05/13 1449 Last data filed at 07/05/13 1441  Gross per 24 hour  Intake 4800.42 ml  Output   2000 ml  Net 2800.42 ml   Filed Weights   07/03/13 0609 07/04/13 0622 07/05/13 0432  Weight:  153.769 kg (339 lb) 153.6 kg (338 lb 10 oz) 153.86 kg (339 lb 3.2 oz)    Exam:  General:  NAD, NG tube removed  Cardiovascular: regular rate and rhythm, without MRG  Respiratory: good air movement, clear to auscultation throughout, no wheezing, ronchi or rales  Abdomen: soft, not tender to palpation, positive bowel sounds  MSK: chronic venous stasis changes bilateral LE  Neuro: non focal  Data Reviewed: Basic Metabolic Panel:  Recent Labs Lab 06/30/13 0410  07/01/13 1835 07/02/13 0455 07/03/13 0521 07/04/13 0430 07/05/13 0535  NA 136  < > 131* 130* 134* 135 135  K 2.8*  < > 3.5 3.1* 3.0* 3.6 3.4*  CL 97  < > 93* 94* 98 102 103  CO2 29  < > 23 25 26 27 22   GLUCOSE 146*  < > 194* 185* 154* 133* 155*  BUN 13  < > 26* 33* 42* 27* 15  CREATININE 1.20  < > 3.09* 3.06* 2.33* 1.50* 1.17  CALCIUM 8.5  < > 8.6 8.7 8.7 8.3* 8.2*  MG 2.0  --   --   --   --   --   --   < > = values in this interval not displayed.  CBC:  Recent Labs Lab 06/30/13 0410 07/03/13 0521 07/04/13 0430  WBC 8.0 9.5 9.7  HGB 12.6* 11.6* 11.0*  HCT 37.3* 33.6* 32.9*  MCV 93.5 93.1 93.5  PLT 260 328 335   BNP (last 3 results)  Recent Labs  06/23/13 1832  PROBNP 2292.0*   CBG:  Recent Labs Lab 07/04/13 1149 07/04/13 1702 07/04/13 2141 07/05/13 0729 07/05/13 1122  GLUCAP 135* 142* 141* 142* 168*    Studies: Dg Abd 1 View  07/04/2013   *RADIOLOGY REPORT*  Clinical Data: Small bowel dilatation  ABDOMEN - 1 VIEW  Comparison: 07/02/2013  Findings: A nasogastric catheter is seen within the stomach.  There are persistent dilated loops of small bowel identified within the mid abdomen.  No free air is seen.  No abnormal mass or abnormal calcifications are noted.  The degree of small bowel dilatation is roughly stable from prior exam.  IMPRESSION: No significant interval change in the small bowel dilatation.   Original Report Authenticated By: Alcide Clever, M.D.   Scheduled Meds: .  hydrocortisone   Rectal BID  . insulin aspart  0-15 Units Subcutaneous TID WC  . multivitamin with minerals  1 tablet Oral Daily  . pantoprazole (PROTONIX) IV  40 mg Intravenous Q24H  . protein supplement  2 scoop Oral TID WC  . simethicone  160 mg Oral QID  . sodium chloride  3 mL Intravenous Q12H  . sodium chloride  3  mL Intravenous Q12H  . Warfarin - Pharmacist Dosing Inpatient   Does not apply q1800   Continuous Infusions: . 0.9 % NaCl with KCl 20 mEq / L 100 mL/hr at 07/05/13 1096   Principal Problem:   Adynamic ileus Active Problems:   Fever   Cellulitis   CHF (congestive heart failure)   Atrial fibrillation   Diarrhea   HTN (hypertension)   Diabetes mellitus   Hyperlipidemia   ARF (acute renal failure)   Unspecified intestinal obstruction   Nonspecific (abnormal) findings on radiological and other examination of gastrointestinal tract  Time spent: 25  Pamella Pert, MD [facility] Pager [phone]. If 7 PM - 7 AM, please contact night-coverage at [email or web address], password [facility] 07/05/2013, 2:49 PM  LOS: 12 days

## 2013-07-05 NOTE — Progress Notes (Signed)
General Surgery Note  LOS: 12 days  Room -1430  Assessment/Plan: 1.  Ileus vs SBO  Had BM/flatus this AM  Will remove NGT  Will leave on clear liquids   2.  Right lower extremity cellulitis 3.  History of CHF 4.  Atrial fib  On Coumadin  PT/INR -35/3.7 - 07/05/2013  5.  DVT prophylaxis - on coumadin 6.  Morbid obesity 7.  Rectal pain  Question fissure from manipulation  He has minimal hemorrhoids  Subjective:  Doing well from GI standpoint.  Main complaint is rectal pain.  No nausea or vomiting. Objective:   Filed Vitals:   07/05/13 0432  BP: 126/65  Pulse: 71  Temp: 98.5 F (36.9 C)  Resp: 16     Intake/Output from previous day:  07/11/23 0701 - 08/16 0700 In: 5040.4 [P.O.:1920; I.V.:3120.4] Out: 2000 [Emesis/NG output:2000]  Intake/Output this shift:      Physical Exam:   General: Obese WM who is alert and oriented.    HEENT: Normal. Pupils equal. .   Lungs: Clear.   Abdomen: Obese.  Bruising from Lovenox in both lower abdominal areas   Rectum:  Minimal external hemorrhoids   LE:  Chronic venous stasis, right worse that left  Lab Results:    Recent Labs  07/03/13 0521 07/10/2013 0430  WBC 9.5 9.7  HGB 11.6* 11.0*  HCT 33.6* 32.9*  PLT 328 335    BMET   Recent Labs  July 10, 2013 0430 07/05/13 0535  NA 135 135  K 3.6 3.4*  CL 102 103  CO2 27 22  GLUCOSE 133* 155*  BUN 27* 15  CREATININE 1.50* 1.17  CALCIUM 8.3* 8.2*    PT/INR   Recent Labs  07/10/13 0430 07/05/13 0535  LABPROT 37.3* 35.6*  INR 3.98* 3.74*    ABG  No results found for this basename: PHART, PCO2, PO2, HCO3,  in the last 72 hours   Studies/Results:  Dg Abd 1 View  Jul 10, 2013   *RADIOLOGY REPORT*  Clinical Data: Small bowel dilatation  ABDOMEN - 1 VIEW  Comparison: 07/02/2013  Findings: A nasogastric catheter is seen within the stomach.  There are persistent dilated loops of small bowel identified within the mid abdomen.  No free air is seen.  No abnormal mass or abnormal  calcifications are noted.  The degree of small bowel dilatation is roughly stable from prior exam.  IMPRESSION: No significant interval change in the small bowel dilatation.   Original Report Authenticated By: Alcide Clever, M.D.     Anti-infectives:   Anti-infectives   Start     Dose/Rate Route Frequency Ordered Stop   06/28/13 1600  ceFAZolin (ANCEF) IVPB 1 g/50 mL premix  Status:  Discontinued     1 g 100 mL/hr over 30 Minutes Intravenous 3 times per day 06/28/13 1548 07/10/13 1421   06/24/13 1000  vancomycin (VANCOCIN) 1,500 mg in sodium chloride 0.9 % 500 mL IVPB  Status:  Discontinued     1,500 mg 250 mL/hr over 120 Minutes Intravenous Every 12 hours 06/23/13 1959 06/28/13 1547   06/24/13 0600  piperacillin-tazobactam (ZOSYN) IVPB 3.375 g  Status:  Discontinued     3.375 g 12.5 mL/hr over 240 Minutes Intravenous 3 times per day 06/23/13 1949 06/26/13 0830   06/23/13 2200  vancomycin (VANCOCIN) 2,000 mg in sodium chloride 0.9 % 500 mL IVPB     2,000 mg 250 mL/hr over 120 Minutes Intravenous  Once 06/23/13 1959 06/24/13 0005   06/23/13 2000  piperacillin-tazobactam (  ZOSYN) IVPB 3.375 g     3.375 g 100 mL/hr over 30 Minutes Intravenous  Once 06/23/13 1949 06/23/13 2322      Ovidio Kin, MD, FACS Pager: [phone],   [facility] Office: [phone] 07/05/2013

## 2013-07-05 NOTE — Progress Notes (Signed)
Patient had a bowel movement this morning, and passing more flatus. Had no complaints of abdominal discomfort since NGT was clampled around  3am.

## 2013-07-05 NOTE — Progress Notes (Signed)
Patient started complaining of feeling stomach fullness, abdominal discomfort at around 10 pm. Patient is uneasy in his chair, saying he dont feel good after the NGT was clamped for a long time. Abdomen appeared to be distended and firm, with hypoactive bowel sounds on LUQ and LLQ. NGT was hooked back to low intermittent suction, and initial drainage was 900 ml of thick yellowish brown output. Followed by another 1100 ml after about 5 hours. Patient felt relieved and rested. NGT clamped for the next 4 hours. Will continue to monitor patient.

## 2013-07-06 ENCOUNTER — Inpatient Hospital Stay (HOSPITAL_COMMUNITY): Payer: MEDICARE

## 2013-07-06 LAB — COMPREHENSIVE METABOLIC PANEL
ALT: 32 U/L (ref 0–53)
Alkaline Phosphatase: 73 U/L (ref 39–117)
CO2: 24 mEq/L (ref 19–32)
Chloride: 101 mEq/L (ref 96–112)
GFR calc Af Amer: 72 mL/min — ABNORMAL LOW (ref >90)
GFR calc non Af Amer: 62 mL/min — ABNORMAL LOW (ref >90)
Glucose, Bld: 160 mg/dL — ABNORMAL HIGH (ref 70–99)
Potassium: 3.5 mEq/L (ref 3.5–5.1)
Sodium: 134 mEq/L — ABNORMAL LOW (ref 135–145)
Total Bilirubin: 2.1 mg/dL — ABNORMAL HIGH (ref 0.3–1.2)

## 2013-07-06 LAB — PROTIME-INR
INR: 3.31 — ABNORMAL HIGH (ref 0.00–1.49)
Prothrombin Time: 32.4 seconds — ABNORMAL HIGH (ref 11.6–15.2)

## 2013-07-06 LAB — GLUCOSE, CAPILLARY
Glucose-Capillary: 158 mg/dL — ABNORMAL HIGH (ref 70–99)
Glucose-Capillary: 160 mg/dL — ABNORMAL HIGH (ref 70–99)

## 2013-07-06 LAB — CBC
Hemoglobin: 11.5 g/dL — ABNORMAL LOW (ref 13.0–17.0)
RBC: 3.66 MIL/uL — ABNORMAL LOW (ref 4.22–5.81)

## 2013-07-06 MED ORDER — PANTOPRAZOLE SODIUM 40 MG PO TBEC
40.0000 mg | DELAYED_RELEASE_TABLET | Freq: Every day | ORAL | Status: DC
  Administered 2013-07-06: 40 mg via ORAL
  Filled 2013-07-06 (×2): qty 1

## 2013-07-06 NOTE — Progress Notes (Addendum)
ANTICOAGULATION CONSULT NOTE  Pharmacy Consult for Warfarin Indication: atrial fibrillation  Allergies  Allergen Reactions  . Lisinopril Swelling    Tongue swelling    Patient Measurements: Height: 5\' 11"  (180.3 cm) Weight: 339 lb 3.2 oz (153.86 kg) IBW/kg (Calculated) : 75.3  Vital Signs: Temp: 97.3 F (36.3 C) (08/17 0530) Temp src: Oral (08/17 0530) BP: 132/71 mmHg (08/17 0530) Pulse Rate: 79 (08/17 0530)  Labs:  Recent Labs  07/04/13 0430 07/05/13 0535 07/06/13 0445  HGB 11.0*  --  11.5*  HCT 32.9*  --  34.3*  PLT 335  --  360  LABPROT 37.3* 35.6* 32.4*  INR 3.98* 3.74* 3.31*  CREATININE 1.50* 1.17 1.15    Estimated Creatinine Clearance: 88.9 ml/min (by C-G formula based on Cr of 1.15).  Medications:  Scheduled:  . hydrocortisone   Rectal BID  . insulin aspart  0-15 Units Subcutaneous TID WC  . multivitamin with minerals  1 tablet Oral Daily  . pantoprazole (PROTONIX) IV  40 mg Intravenous Q24H  . protein supplement  2 scoop Oral TID WC  . simethicone  160 mg Oral QID  . sodium chloride  3 mL Intravenous Q12H  . sodium chloride  3 mL Intravenous Q12H  . Warfarin - Pharmacist Dosing Inpatient   Does not apply q1800   Infusions:  . 0.9 % NaCl with KCl 20 mEq / L 100 mL/hr at 07/06/13 1610    Assessment: 72 yo M admitted 06/23/13, on chronic anticoagulation with warfarin for A-fib. Patient started on Lovenox bridge on 8/6 d/t subtherapeutic INR and active Afib; Lovenox d/c'd 8/9 when INR was therapeutic. Home warfarin dose reported as 5mg  MWFSun and 2.5mg  TuThSat Patient initially receivied larger warfarin doses than usual in hospital due to subtherapeutic INR on admission  INR supratherapeutic at 3.31 but slowly trending down, likely from decreased po intake with SBO vs ileus (last dose 8/12). Now have multiple BMs and ileus now resolved Since no visible bleeding reported, no indication for vitamin K at this time CBC stable  Goal of Therapy:  INR  2-3 Monitor platelets by anticoagulation protocol: Yes   Plan:   No warfarin tonight, resume once INR <3   Expect INR will trend down more quickly once tolerating regular diet  Daily PT/INR  This patient is receiving pantoprazole. Based on criteria approved by the Pharmacy and Therapeutics Committee, this medication is being converted to the equivalent oral dose form. These criteria include:   . The patient is eating (either orally or per tube) and/or has been taking other orally administered medications for at least 24 hours. Advancing to regular diet today . This patient has no evidence of active gastrointestinal bleeding or impaired GI absorption (gastrectomy, short bowel, patient on TNA or NPO).   If you have questions about this conversion, please contact the pharmacy department.  Juliette Alcide, PharmD, BCPS.   Pager: [phone] 10:29 AM 07/06/2013

## 2013-07-06 NOTE — Progress Notes (Signed)
General Surgery Note  LOS: 13 days  Room -1430  Assessment/Plan: 1.  Ileus vs SBO  Had 3 BM's since midnight.  Tolerating clear liq.  Will advance to reg diet.  Will sign off.  He has no acute surgical problem and seems to be getting better from a GI standpoint.   2.  Right lower extremity cellulitis 3.  History of CHF 4.  Atrial fib  On Coumadin  PT/INR -32.4/3.3 - 07/06/2013  5.  DVT prophylaxis - on coumadin 6.  Morbid obesity 7.  Rectal pain  Question fissure from manipulation  He has minimal hemorrhoids  Subjective:  Having BM's.  Wants something more to eat.  Objective:   Filed Vitals:   07/06/13 0530  BP: 132/71  Pulse: 79  Temp: 97.3 F (36.3 C)  Resp: 20     Intake/Output from previous day:  08/16 0701 - 08/17 0700 In: 2806.7 [P.O.:480; I.V.:2326.7] Out: -   Intake/Output this shift:  Total I/O In: 240 [P.O.:240] Out: -    Physical Exam:   General: Obese WM who is alert and oriented.    HEENT: Normal. Pupils equal. .   Lungs: Clear.   Abdomen: Obese.  Bruising from Lovenox in both lower abdominal areas.  BS present.   Rectum:  Minimal external hemorrhoids   LE:  Chronic venous stasis, right worse that left  Lab Results:     Recent Labs  07/04/13 0430 07/06/13 0445  WBC 9.7 6.5  HGB 11.0* 11.5*  HCT 32.9* 34.3*  PLT 335 360    BMET    Recent Labs  07/05/13 0535 07/06/13 0445  NA 135 134*  K 3.4* 3.5  CL 103 101  CO2 22 24  GLUCOSE 155* 160*  BUN 15 12  CREATININE 1.17 1.15  CALCIUM 8.2* 8.5    PT/INR    Recent Labs  07/05/13 0535 07/06/13 0445  LABPROT 35.6* 32.4*  INR 3.74* 3.31*    ABG  No results found for this basename: PHART, PCO2, PO2, HCO3,  in the last 72 hours   Studies/Results:  No results found.   Anti-infectives:   Anti-infectives   Start     Dose/Rate Route Frequency Ordered Stop   06/28/13 1600  ceFAZolin (ANCEF) IVPB 1 g/50 mL premix  Status:  Discontinued     1 g 100 mL/hr over 30 Minutes  Intravenous 3 times per day 06/28/13 1548 07/04/13 1421   06/24/13 1000  vancomycin (VANCOCIN) 1,500 mg in sodium chloride 0.9 % 500 mL IVPB  Status:  Discontinued     1,500 mg 250 mL/hr over 120 Minutes Intravenous Every 12 hours 06/23/13 1959 06/28/13 1547   06/24/13 0600  piperacillin-tazobactam (ZOSYN) IVPB 3.375 g  Status:  Discontinued     3.375 g 12.5 mL/hr over 240 Minutes Intravenous 3 times per day 06/23/13 1949 06/26/13 0830   06/23/13 2200  vancomycin (VANCOCIN) 2,000 mg in sodium chloride 0.9 % 500 mL IVPB     2,000 mg 250 mL/hr over 120 Minutes Intravenous  Once 06/23/13 1959 06/24/13 0005   06/23/13 2000  piperacillin-tazobactam (ZOSYN) IVPB 3.375 g     3.375 g 100 mL/hr over 30 Minutes Intravenous  Once 06/23/13 1949 06/23/13 2322      Ovidio Kin, MD, FACS Pager: [phone],   [facility]: [phone] 07/06/2013

## 2013-07-06 NOTE — Progress Notes (Signed)
[facility] PROGRESS NOTE  Edwin [name] [name]:[id number] DOB: [birth year] DOA: 06/23/2013 PCP: Aura Dials, MD  Assessment/Plan: SBO: abd distension/bloating. Patient had NG tube for decompression, discontinued 8/16. Has been doing well since on clears, will advance to regular today and consider discharge 8/18 if he tolerates.  SIRS/Right lower extremity cellulitis. - Significant improvement now. S/p Vancomycin for 5 days, and completed treatment . Blood Cx NGTD - dopplers negative for DVT  - resolved AKI - may be multifactorial, can be due to diuresis superimposed on significant GI losses with 6L output from NG tube. - d/c lasix and provide supportive hydration - improved, close to normal now. CHF - acute on chronic likely diastolic, - D/C IV lasix 80mg  q12, - 2D ECHO with EF of 55-60%. No wall motion abnormalities. Left atrium is severely dilated.  - unable to get acurate I/Os. Follow.  Atrial fibrillation  - rate controlled currently. Continue Coumadin per pharmacy, INR therapeutic. Beta blocker on hold secondary to bradycardia. May need to resume a low dose if needed for heart rate Diabetes mellitus type 2  - closely follow CBGs with sliding-scale, hold metformin, AIC 7.9 Hypertension  - D/C'd Lopressor secondary to bradycardia. Blood pressure stable follow Hypokalemia - replace as needed Morbid obesity Asymptomatic bradycardia  - Patient noted to have bradycardia in the 30s while sleeping however improve on awakening. Per nursing patient with some 2 second pauses. Bradycardia improved off Lopressor.    Code Status: Full Family Communication: none  Disposition Plan: home when medically ready  Consultants:  GI  Surgery  Procedures:  none  Anti-infectives   Start     Dose/Rate Route Frequency Ordered Stop   06/28/13 1600  ceFAZolin (ANCEF) IVPB 1 g/50 mL premix  Status:  Discontinued     1 g 100 mL/hr over 30 Minutes Intravenous 3 times per day 06/28/13 1548  07/04/13 1421   06/24/13 1000  vancomycin (VANCOCIN) 1,500 mg in sodium chloride 0.9 % 500 mL IVPB  Status:  Discontinued     1,500 mg 250 mL/hr over 120 Minutes Intravenous Every 12 hours 06/23/13 1959 06/28/13 1547   06/24/13 0600  piperacillin-tazobactam (ZOSYN) IVPB 3.375 g  Status:  Discontinued     3.375 g 12.5 mL/hr over 240 Minutes Intravenous 3 times per day 06/23/13 1949 06/26/13 0830   06/23/13 2200  vancomycin (VANCOCIN) 2,000 mg in sodium chloride 0.9 % 500 mL IVPB     2,000 mg 250 mL/hr over 120 Minutes Intravenous  Once 06/23/13 1959 06/24/13 0005   06/23/13 2000  piperacillin-tazobactam (ZOSYN) IVPB 3.375 g     3.375 g 100 mL/hr over 30 Minutes Intravenous  Once 06/23/13 1949 06/23/13 2322     Antibiotics Given (last 72 hours)   Date/Time Action Medication Dose Rate   07/03/13 2156 Given   ceFAZolin (ANCEF) IVPB 1 g/50 mL premix 1 g 100 mL/hr   07/04/13 0517 Given   ceFAZolin (ANCEF) IVPB 1 g/50 mL premix 1 g 100 mL/hr   07/04/13 1435 Given   ceFAZolin (ANCEF) IVPB 1 g/50 mL premix 1 g 100 mL/hr     HPI/Subjective: - feeling well, eager to try regular food.   Objective: Filed Vitals:   07/05/13 0432 07/05/13 1440 07/05/13 2056 07/06/13 0530  BP: 126/65 138/63 122/76 132/71  Pulse: 71 87 79 79  Temp: 98.5 F (36.9 C) 97.5 F (36.4 C) 97.5 F (36.4 C) 97.3 F (36.3 C)  TempSrc: Axillary Oral Oral Oral  Resp: 16 20 18  20  Height:      Weight: 153.86 kg (339 lb 3.2 oz)     SpO2: 97% 95% 100% 100%    Intake/Output Summary (Last 24 hours) at 07/06/13 1416 Last data filed at 07/06/13 0820  Gross per 24 hour  Intake 2806.67 ml  Output      0 ml  Net 2806.67 ml   Filed Weights   07/03/13 0609 07/04/13 0622 07/05/13 0432  Weight: 153.769 kg (339 lb) 153.6 kg (338 lb 10 oz) 153.86 kg (339 lb 3.2 oz)    Exam:  General:  NAD  Cardiovascular: regular rate and rhythm, without MRG  Respiratory: good air movement, clear to auscultation throughout, no  wheezing, ronchi or rales  Abdomen: soft, not tender to palpation, positive bowel sounds  MSK: chronic venous stasis changes bilateral LE  Neuro: non focal  Data Reviewed: Basic Metabolic Panel:  Recent Labs Lab 06/30/13 0410  07/02/13 0455 07/03/13 0521 07/04/13 0430 07/05/13 0535 07/06/13 0445  NA 136  < > 130* 134* 135 135 134*  K 2.8*  < > 3.1* 3.0* 3.6 3.4* 3.5  CL 97  < > 94* 98 102 103 101  CO2 29  < > 25 26 27 22 24   GLUCOSE 146*  < > 185* 154* 133* 155* 160*  BUN 13  < > 33* 42* 27* 15 12  CREATININE 1.20  < > 3.06* 2.33* 1.50* 1.17 1.15  CALCIUM 8.5  < > 8.7 8.7 8.3* 8.2* 8.5  MG 2.0  --   --   --   --   --   --   < > = values in this interval not displayed.  CBC:  Recent Labs Lab 06/30/13 0410 07/03/13 0521 07/04/13 0430 07/06/13 0445  WBC 8.0 9.5 9.7 6.5  HGB 12.6* 11.6* 11.0* 11.5*  HCT 37.3* 33.6* 32.9* 34.3*  MCV 93.5 93.1 93.5 93.7  PLT 260 328 335 360   BNP (last 3 results)  Recent Labs  06/23/13 1832  PROBNP 2292.0*   CBG:  Recent Labs Lab 07/05/13 1122 07/05/13 1659 07/05/13 2146 07/06/13 0800 07/06/13 1130  GLUCAP 168* 151* 146* 164* 128*    Studies: No results found. Scheduled Meds: . hydrocortisone   Rectal BID  . insulin aspart  0-15 Units Subcutaneous TID WC  . multivitamin with minerals  1 tablet Oral Daily  . pantoprazole  40 mg Oral Daily  . protein supplement  2 scoop Oral TID WC  . simethicone  160 mg Oral QID  . sodium chloride  3 mL Intravenous Q12H  . sodium chloride  3 mL Intravenous Q12H  . Warfarin - Pharmacist Dosing Inpatient   Does not apply q1800   Continuous Infusions: . 0.9 % NaCl with KCl 20 mEq / L 50 mL/hr at 07/06/13 1025   Principal Problem:   Adynamic ileus Active Problems:   Fever   Cellulitis   CHF (congestive heart failure)   Atrial fibrillation   Diarrhea   HTN (hypertension)   Diabetes mellitus   Hyperlipidemia   ARF (acute renal failure)   Unspecified intestinal  obstruction   Nonspecific (abnormal) findings on radiological and other examination of gastrointestinal tract  Time spent: 25  Pamella Pert, MD [facility] Pager [phone]. If 7 PM - 7 AM, please contact night-coverage at [email or web address], password [facility] 07/06/2013, 2:16 PM  LOS: 13 days

## 2013-07-07 ENCOUNTER — Inpatient Hospital Stay (HOSPITAL_COMMUNITY): Payer: MEDICARE

## 2013-07-07 LAB — PROTIME-INR: INR: 3.44 — ABNORMAL HIGH (ref 0.00–1.49)

## 2013-07-07 LAB — BASIC METABOLIC PANEL
Calcium: 8.7 mg/dL (ref 8.4–10.5)
GFR calc non Af Amer: 60 mL/min — ABNORMAL LOW (ref >90)
Glucose, Bld: 170 mg/dL — ABNORMAL HIGH (ref 70–99)
Sodium: 135 mEq/L (ref 135–145)

## 2013-07-07 LAB — GLUCOSE, CAPILLARY: Glucose-Capillary: 141 mg/dL — ABNORMAL HIGH (ref 70–99)

## 2013-07-07 MED ORDER — PANTOPRAZOLE SODIUM 40 MG PO TBEC
40.0000 mg | DELAYED_RELEASE_TABLET | Freq: Two times a day (BID) | ORAL | Status: DC
  Administered 2013-07-07 – 2013-07-08 (×3): 40 mg via ORAL
  Filled 2013-07-07 (×3): qty 1

## 2013-07-07 MED ORDER — METOPROLOL SUCCINATE 12.5 MG HALF TABLET
12.5000 mg | ORAL_TABLET | Freq: Two times a day (BID) | ORAL | Status: DC
  Administered 2013-07-07 – 2013-07-08 (×3): 12.5 mg via ORAL
  Filled 2013-07-07 (×4): qty 1

## 2013-07-07 MED ORDER — FUROSEMIDE 80 MG PO TABS
80.0000 mg | ORAL_TABLET | Freq: Two times a day (BID) | ORAL | Status: DC
  Administered 2013-07-07 – 2013-07-08 (×3): 80 mg via ORAL
  Filled 2013-07-07 (×5): qty 1

## 2013-07-07 NOTE — Progress Notes (Signed)
[facility] PROGRESS NOTE Interim History: 72 y.o. male history of CHF, diabetes mellitus type 2, hyperlipidemia, A. fib on Coumadin presented with complaints of having fever chills and feeling weak and short of breath. Patient has been having these symptoms for last 2 days. Denies any chest pain or productive cough. Last 2 days patient has had diarrhea 3-4 episodes. Denies any vomiting. In the ER patient was found to have right lower extremity erythema actually up to mid thigh. Patient was found to be febrile and lactic acid was found to be elevated with elevated leukocyte counts  Assessment/Plan: Acute on Chronic diastolic HF: - Resume lasix dose. - 2D ECHO with EF of 55-60%. No wall motion abnormalities. Left atrium is severely dilated.  - Unable to get acurate I/Os. Follow.  - Weight has decrease significantly 161.5->153.4. - KVO IV fluids.   ARF (acute renal failure): - May be multifactorial, can be due to diuresis superimposed on significant GI losses with 6L output from NG tube.  - Improved, close to normal now. - B-met.   Adynamic ileus/SBO: - NG tube for decompression, discontinued 8/16 - Now with  Multiple BM, check C.dif. - clear liq diet, repeat x-ray in am.  Cellulitis/SIRS: - S/p Vancomycin for 5 days, and completed treatment . Blood Cx NGTD  - dopplers negative for DVT   Atrial fibrillation/bradycardia : - Rate controlled currently.  - Continue Coumadin per pharmacy, INR therapeutic.  - Resume low dose lasix.  Diabetes mellitus type 2  - closely follow CBGs with sliding-scale. - resume metformin.  Hypertension  - Resume metoprolol. Blood pressure stable follow.  Hypokalemia - replace as needed   Code Status: Full  Family Communication: none  Disposition Plan: home when medically ready    Consultants:  GI  surbery  Procedures:  None  Antibiotics:    HPI/Subjective: complaning of bloartin  Objective: Filed Vitals:   07/06/13 1628  07/06/13 2249 07/07/13 0500 07/07/13 0540  BP: 132/72 137/77  128/73  Pulse: 87 81  88  Temp: 97.3 F (36.3 C) 98.2 F (36.8 C)  98.3 F (36.8 C)  TempSrc: Oral Oral  Oral  Resp: 20 18  20   Height:      Weight:   153.497 kg (338 lb 6.4 oz)   SpO2: 100% 100%  99%    Intake/Output Summary (Last 24 hours) at 07/07/13 1021 Last data filed at 07/07/13 0556  Gross per 24 hour  Intake 1390.84 ml  Output      0 ml  Net 1390.84 ml   Filed Weights   07/04/13 0622 07/05/13 0432 07/07/13 0500  Weight: 153.6 kg (338 lb 10 oz) 153.86 kg (339 lb 3.2 oz) 153.497 kg (338 lb 6.4 oz)    Exam:  General: Alert, awake, oriented x3, in no acute distress.  HEENT: No bruits, no goiter.  Heart: Regular rate and rhythm, without murmurs, rubs, gallops.  Lungs: Good air movement, clear to auscultation Abdomen: Soft, nontender, nondistended, positive bowel sounds.  Neuro: Grossly intact, nonfocal.   Data Reviewed: Basic Metabolic Panel:  Recent Labs Lab 07/03/13 0521 07/04/13 0430 07/05/13 0535 07/06/13 0445 07/07/13 0500  NA 134* 135 135 134* 135  K 3.0* 3.6 3.4* 3.5 3.8  CL 98 102 103 101 103  CO2 26 27 22 24 22   GLUCOSE 154* 133* 155* 160* 170*  BUN 42* 27* 15 12 12   CREATININE 2.33* 1.50* 1.17 1.15 1.18  CALCIUM 8.7 8.3* 8.2* 8.5 8.7   Liver Function Tests:  Recent Labs Lab 07/06/13 0445  AST 47*  ALT 32  ALKPHOS 73  BILITOT 2.1*  PROT 6.5  ALBUMIN 2.5*   No results found for this basename: LIPASE, AMYLASE,  in the last 168 hours No results found for this basename: AMMONIA,  in the last 168 hours CBC:  Recent Labs Lab 07/03/13 0521 07/04/13 0430 07/06/13 0445  WBC 9.5 9.7 6.5  HGB 11.6* 11.0* 11.5*  HCT 33.6* 32.9* 34.3*  MCV 93.1 93.5 93.7  PLT 328 335 360   Cardiac Enzymes: No results found for this basename: CKTOTAL, CKMB, CKMBINDEX, TROPONINI,  in the last 168 hours BNP (last 3 results)  Recent Labs  06/23/13 1832  PROBNP 2292.0*   CBG:  Recent  Labs Lab 07/06/13 0800 07/06/13 1130 07/06/13 1657 07/06/13 2247 07/07/13 0728  GLUCAP 164* 128* 158* 160* 149*    No results found for this or any previous visit (from the past 240 hour(s)).   Studies: Dg Abd 1 View  07/06/2013   *RADIOLOGY REPORT*  Clinical Data: Abdominal pain.  Ileus.  ABDOMEN - 1 VIEW  Comparison: 07/04/2013.  Findings: No definite plain film evidence of free air however these both appear to be the supine films.  If there is concern for free air, decubitus film should be obtained.  The nasogastric tube has been removed.  There is persistent dilation of large and small bowel, most compatible with ileus.  The view of the lower abdomen and pelvis is under penetrated and poor diagnostic quality.  IMPRESSION: Removal of nasogastric tube.  Persistent large and small bowel dilation compatible with ileus.   Original Report Authenticated By: Andreas Newport, M.D.    Scheduled Meds: . hydrocortisone   Rectal BID  . insulin aspart  0-15 Units Subcutaneous TID WC  . multivitamin with minerals  1 tablet Oral Daily  . pantoprazole  40 mg Oral Daily  . protein supplement  2 scoop Oral TID WC  . simethicone  160 mg Oral QID  . sodium chloride  3 mL Intravenous Q12H  . sodium chloride  3 mL Intravenous Q12H  . Warfarin - Pharmacist Dosing Inpatient   Does not apply q1800   Continuous Infusions: . 0.9 % NaCl with KCl 20 mEq / L 50 mL/hr at 07/07/13 0824     Marinda Elk  [facility] Pager [phone]. If 8PM-8AM, please contact night-coverage at [email or web address], password [facility] 07/07/2013, 10:21 AM  LOS: 14 days

## 2013-07-07 NOTE — Progress Notes (Addendum)
ANTICOAGULATION CONSULT NOTE  Pharmacy Consult for Warfarin Indication: atrial fibrillation  Labs:  Recent Labs  07/05/13 0535 07/06/13 0445 07/07/13 0500  HGB  --  11.5*  --   HCT  --  34.3*  --   PLT  --  360  --   LABPROT 35.6* 32.4* 33.4*  INR 3.74* 3.31* 3.44*  CREATININE 1.17 1.15 1.18    Assessment: 72 yo M admitted 06/23/13, on chronic anticoagulation with warfarin for A-fib. Patient started on Lovenox bridge on 8/6 d/t subtherapeutic INR and active Afib; Lovenox d/c'd 8/9 when INR was therapeutic.  Home warfarin dose reported as 5mg  MWFSun and 2.5mg  TuThSat. Patient initially received larger warfarin doses than usual in hospital due to subtherapeutic INR on admission   INR 3.44 today, increasing despite no Coumadin since 8/12. No bleeding reported. No indication for Vitamin K at this time. CBC stable.   Goal of Therapy:  INR 2-3 Monitor platelets by anticoagulation protocol: Yes   Plan:   Continue to hold Coumadin tonight  IF patient is being discharged today - please hold Coumadin.  Plan PT/INR in 1-2 days then resume as per clinic once INR < 3.     Daily PT/INR  Geoffry Paradise, PharmD, BCPS Pager: [phone] 8:48 AM Pharmacy #: [phone]

## 2013-07-07 NOTE — Progress Notes (Signed)
Physical Therapy Discharge Patient Details Name: Edwin [name] MRN: [id number] DOB: 04/02/1941 Today's Date: 07/07/2013 Time:  1443- 1445    Patient discharged from PT services secondary to observed pt ambulating around entire unit, unassisted, on 2 separate occasions today. Pt mobilizing at Mod I level.Will sign off. Encouraged pt to continue ambulation and to use RW at home PRN. No further acute PT needs at this time. No follow up PT needs at discharge. Thanks.   Please see latest therapy progress note for current level of functioning and progress toward goals.    Progress and discharge plan discussed with patient and son.  GP   Rebeca Alert, MPT [phone]    Rebeca Alert [facility] 07/07/2013, 3:43 PM

## 2013-07-08 ENCOUNTER — Inpatient Hospital Stay (HOSPITAL_COMMUNITY): Payer: MEDICARE

## 2013-07-08 LAB — BASIC METABOLIC PANEL
BUN: 16 mg/dL (ref 6–23)
Chloride: 98 mEq/L (ref 96–112)
GFR calc Af Amer: 56 mL/min — ABNORMAL LOW (ref >90)
Potassium: 3.6 mEq/L (ref 3.5–5.1)

## 2013-07-08 LAB — CBC
HCT: 33.1 % — ABNORMAL LOW (ref 39.0–52.0)
Hemoglobin: 11 g/dL — ABNORMAL LOW (ref 13.0–17.0)
RBC: 3.53 MIL/uL — ABNORMAL LOW (ref 4.22–5.81)
WBC: 7.9 10*3/uL (ref 4.0–10.5)

## 2013-07-08 LAB — PROTIME-INR
INR: 3.28 — ABNORMAL HIGH (ref 0.00–1.49)
Prothrombin Time: 32.2 seconds — ABNORMAL HIGH (ref 11.6–15.2)

## 2013-07-08 LAB — GLUCOSE, CAPILLARY: Glucose-Capillary: 140 mg/dL — ABNORMAL HIGH (ref 70–99)

## 2013-07-08 MED ORDER — WARFARIN SODIUM 5 MG PO TABS: 2.5000 mg | ORAL_TABLET | Freq: Two times a day (BID) | ORAL | Status: DC

## 2013-07-08 MED ORDER — FUROSEMIDE 40 MG PO TABS: 80.0000 mg | ORAL_TABLET | Freq: Two times a day (BID) | ORAL | Status: DC

## 2013-07-08 MED ORDER — PRAVASTATIN SODIUM 20 MG PO TABS: 20.0000 mg | ORAL_TABLET | Freq: Every day | ORAL | Status: AC

## 2013-07-08 MED ORDER — PANTOPRAZOLE SODIUM 40 MG PO TBEC: 40.0000 mg | DELAYED_RELEASE_TABLET | Freq: Every day | ORAL | Status: DC

## 2013-07-08 NOTE — Progress Notes (Signed)
Discharge instructions explained using teach back, prescriptions given, pt verbalizes understanding of same. Stable for discharge.

## 2013-07-08 NOTE — Progress Notes (Signed)
[facility] PROGRESS NOTE Interim History: 72 y.o. male history of CHF, diabetes mellitus type 2, hyperlipidemia, A. fib on Coumadin presented with complaints of having fever chills and feeling weak and short of breath. Patient has been having these symptoms for last 2 days. Denies any chest pain or productive cough. Last 2 days patient has had diarrhea 3-4 episodes. Denies any vomiting. In the ER patient was found to have right lower extremity erythema actually up to mid thigh. Patient was found to be febrile and lactic acid was found to be elevated with elevated leukocyte counts  Assessment/Plan: Acute on Chronic diastolic HF: - Mild bump in cr after lasix restarted. - 2D ECHO with EF of 55-60%. No wall motion abnormalities. Left atrium is severely dilated.  - Unable to get acurate I/Os. Follow.   ARF (acute renal failure): - May be multifactorial, can be due to diuresis superimposed on significant GI losses with 6L output from NG tube.  - Improved, close to normal now.   Adynamic ileus/SBO: - NG tube for decompression, discontinued 8/16 - Now with  Multiple BM, negative  C.dif. - tolerate diet.  Cellulitis/SIRS: - S/p Vancomycin for 5 days, and completed treatment . Blood Cx NGTD  - dopplers negative for DVT   Atrial fibrillation/bradycardia : - Rate controlled currently.  - Continue Coumadin per pharmacy, INR therapeutic.  - Resume low dose lasix.  Diabetes mellitus type 2  - closely follow CBGs with sliding-scale. - resume metformin.  Hypertension  - Resume metoprolol. Blood pressure stable follow.  Hypokalemia - replace as needed   Code Status: Full  Family Communication: none  Disposition Plan: home when medically ready    Consultants:  GI  surbery  Procedures:  None  Antibiotics:    HPI/Subjective: Abdominal discomfort resolved.  Objective: Filed Vitals:   07/07/13 0540 07/07/13 1551 07/07/13 2139 07/08/13 0512  BP: 128/73 107/60 135/82  112/67  Pulse: 88 87 80 70  Temp: 98.3 F (36.8 C) 97.3 F (36.3 C) 97.5 F (36.4 C) 98.1 F (36.7 C)  TempSrc: Oral Oral Oral Oral  Resp: 20 18 22 20   Height:      Weight:    158.305 kg (349 lb)  SpO2: 99% 98% 99% 99%    Intake/Output Summary (Last 24 hours) at 07/08/13 0850 Last data filed at 07/08/13 0700  Gross per 24 hour  Intake    720 ml  Output      0 ml  Net    720 ml   Filed Weights   07/05/13 0432 07/07/13 0500 07/08/13 0512  Weight: 153.86 kg (339 lb 3.2 oz) 153.497 kg (338 lb 6.4 oz) 158.305 kg (349 lb)    Exam:  General: Alert, awake, oriented x3, in no acute distress.  HEENT: No bruits, no goiter.  Heart: Regular rate and rhythm, without murmurs, rubs, gallops.  Lungs: Good air movement, clear to auscultation Abdomen: Soft, nontender, nondistended, positive bowel sounds.  Neuro: Grossly intact, nonfocal.   Data Reviewed: Basic Metabolic Panel:  Recent Labs Lab 07/04/13 0430 07/05/13 0535 07/06/13 0445 07/07/13 0500 07/08/13 0430  NA 135 135 134* 135 134*  K 3.6 3.4* 3.5 3.8 3.6  CL 102 103 101 103 98  CO2 27 22 24 22 27   GLUCOSE 133* 155* 160* 170* 148*  BUN 27* 15 12 12 16   CREATININE 1.50* 1.17 1.15 1.18 1.41*  CALCIUM 8.3* 8.2* 8.5 8.7 8.4   Liver Function Tests:  Recent Labs Lab 07/06/13 0445  AST 47*  ALT 32  ALKPHOS 73  BILITOT 2.1*  PROT 6.5  ALBUMIN 2.5*   No results found for this basename: LIPASE, AMYLASE,  in the last 168 hours No results found for this basename: AMMONIA,  in the last 168 hours CBC:  Recent Labs Lab 07/03/13 0521 07/04/13 0430 07/06/13 0445 07/08/13 0430  WBC 9.5 9.7 6.5 7.9  HGB 11.6* 11.0* 11.5* 11.0*  HCT 33.6* 32.9* 34.3* 33.1*  MCV 93.1 93.5 93.7 93.8  PLT 328 335 360 430*   Cardiac Enzymes: No results found for this basename: CKTOTAL, CKMB, CKMBINDEX, TROPONINI,  in the last 168 hours BNP (last 3 results)  Recent Labs  06/23/13 1832  PROBNP 2292.0*   CBG:  Recent Labs Lab  07/07/13 0728 07/07/13 1230 07/07/13 1707 07/07/13 2135 07/08/13 0739  GLUCAP 149* 156* 126* 141* 140*    Recent Results (from the past 240 hour(s))  CLOSTRIDIUM DIFFICILE BY PCR     Status: None   Collection Time    07/07/13  7:50 PM      Result Value Range Status   C difficile by pcr NEGATIVE  NEGATIVE Final   Comment: Performed at [facility]     Studies: Dg Abd 1 View  07/07/2013   *RADIOLOGY REPORT*  Clinical Data: Evaluate for ileus  ABDOMEN - 1 VIEW  Comparison: 07/06/2013  Findings: Abnormal small and large bowel dilatation is again noted. Compared with the previous exam the degree of bowel distention is not significantly improved.  No free intraperitoneal air identified.  Gallstones noted.  IMPRESSION:  1.  No change in ileus pattern.   Original Report Authenticated By: Signa Kell, M.D.   Dg Abd 1 View  07/06/2013   *RADIOLOGY REPORT*  Clinical Data: Abdominal pain.  Ileus.  ABDOMEN - 1 VIEW  Comparison: 07/04/2013.  Findings: No definite plain film evidence of free air however these both appear to be the supine films.  If there is concern for free air, decubitus film should be obtained.  The nasogastric tube has been removed.  There is persistent dilation of large and small bowel, most compatible with ileus.  The view of the lower abdomen and pelvis is under penetrated and poor diagnostic quality.  IMPRESSION: Removal of nasogastric tube.  Persistent large and small bowel dilation compatible with ileus.   Original Report Authenticated By: Andreas Newport, M.D.    Scheduled Meds: . hydrocortisone   Rectal BID  . insulin aspart  0-15 Units Subcutaneous TID WC  . metoprolol succinate  12.5 mg Oral BID  . multivitamin with minerals  1 tablet Oral Daily  . pantoprazole  40 mg Oral BID  . simethicone  160 mg Oral QID  . sodium chloride  3 mL Intravenous Q12H  . sodium chloride  3 mL Intravenous Q12H  . Warfarin - Pharmacist Dosing Inpatient   Does not apply q1800    Continuous Infusions:     Marinda Elk  [facility] Pager [phone]. If 8PM-8AM, please contact night-coverage at [email or web address], password [facility] 07/08/2013, 8:50 AM  LOS: 15 days

## 2013-07-08 NOTE — Discharge Summary (Addendum)
Physician Discharge Summary  Edwin [name] ZOX:[id number] DOB: 19-Jun-1941 DOA: 06/23/2013  PCP: Aura Dials, MD  Admit date: 06/23/2013 Discharge date: 07/08/2013  Time spent: 60 minutes  Recommendations for Outpatient Follow-up:  1. Follow up with PCP next week check weight and Blood pressure. 2. Refer for a sleep study. 3. b- met in week. 4. INR on 8.22.2014.  Discharge Diagnoses:  Principal Problem:   Adynamic ileus Active Problems:   Cellulitis   Unspecified intestinal obstruction   Fever   CHF (congestive heart failure)   Atrial fibrillation   Diarrhea   HTN (hypertension)   Diabetes mellitus   Hyperlipidemia   ARF (acute renal failure)   Nonspecific (abnormal) findings on radiological and other examination of gastrointestinal tract   Discharge Condition: stable  Diet recommendation: low sodium dier  Filed Weights   07/05/13 0432 07/07/13 0500 07/08/13 0512  Weight: 153.86 kg (339 lb 3.2 oz) 153.497 kg (338 lb 6.4 oz) 158.305 kg (349 lb)    History of present illness:  72 y.o. male history of CHF, diabetes mellitus type 2, hyperlipidemia, A. fib on Coumadin presented with complaints of having fever chills and feeling weak and short of breath. Patient has been having these symptoms for last 2 days. Denies any chest pain or productive cough. Last 2 days patient has had diarrhea 3-4 episodes. Denies any vomiting. In the ER patient was found to have right lower extremity erythema actually up to mid thigh. Patient was found to be febrile and lactic acid was found to be elevated with elevated leukocyte counts. Patient was initially given 2 L normal saline bolus and started on vancomycin and Zosyn for cellulitis. Patient admitted with for further management.   Hospital Course:  Acute on Chronic diastolic HF:  - Mild bump in cr after lasix restarted.  - 2D ECHO with EF of 55-60%.  Left atrium is severely dilated. RV was mildly dilated. - he was diurese and  - Weight change:  4.808 kg (10 lb 9.6 oz) - referred to the heart failure clinic. Most likely his fluid overload is due to right heart. - will also need a sleep study to initiate bipap.  ARF (acute renal failure):  - May be multifactorial, can be due to diuresis superimposed on significant GI losses with 6L output from NG tube.  - Improved, close to normal now.  - b-met in 1 week. - had a mild increase in cr. On d/c once lasix was resume will hold for 2 days then resume.  Adynamic ileus/SBO:  - NG tube for decompression, discontinued 8/16  - With Multiple BM, negative C.dif.  - tolerate diet.   Cellulitis/SIRS:  - S/p Vancomycin for 5 days, and completed treatment . Blood Cx NGTD  - dopplers negative for DVT   Atrial fibrillation/bradycardia :  - Rate controlled currently.  - Continue Coumadin per pharmacy, INR therapeutic.   Diabetes mellitus type 2  - closely follow CBGs with sliding-scale.  - resume metformin.   Hypertension  - Resume metoprolol. Blood pressure stable follow.  - stable resume ARB.  Hypokalemia:  - replace as needed    Procedures: Echo 8.6.2014: Left ventricle: The cavity size was mildly dilated. There was mild concentric hypertrophy. Systolic function was normal. The estimated ejection fraction was in the range of 55% to 60%. Wall motion was normal; there were no rgional wall motion abnormalities. The study is not tchnically sufficient to allow evaluation of LV diastolic function. - Mitral valve: Calcified annulus. - Left atrium:  The atrium was severely dilated. - Right ventricle: The cavity size was mildly dilated. Wall thickness was normal.  CT abd and pelvis 8.13: Moderate dilatation of small bowel and nondependent transverse  colon, without evidence of transition point. This favors an adynamic ileus over small bowel obstruction  Multiple abdominal x-ray showing: ileus.  Consultations:  GI  Discharge Exam: Filed Vitals:   07/08/13 0512  BP: 112/67  Pulse: 70   Temp: 98.1 F (36.7 C)  Resp: 20    General: see progress note  Discharge Instructions      Discharge Orders   Future Appointments Provider Department Dept Phone   07/30/2013 11:40 AM [facility] [phone]   Future Orders Complete By Expires   Diet - low sodium heart healthy  As directed    Increase activity slowly  As directed        Medication List         acetaminophen 325 MG tablet  Commonly known as:  TYLENOL  Take 325 mg by mouth every 6 (six) hours as needed for pain.     acetaminophen 160 MG/5ML liquid  Commonly known as:  TYLENOL  Take 15 mg/kg by mouth every 4 (four) hours as needed for fever.     furosemide 40 MG tablet  Commonly known as:  LASIX  Take 2 tablets (80 mg total) by mouth 2 (two) times daily.  Start taking on:  07/10/2013     HYDROcodone-acetaminophen 10-325 MG per tablet  Commonly known as:  NORCO  Take 1 tablet by mouth every 6 (six) hours as needed for pain.     losartan 50 MG tablet  Commonly known as:  COZAAR  Take 50 mg by mouth every morning.     metFORMIN 500 MG tablet  Commonly known as:  GLUCOPHAGE  Take 500 mg by mouth daily after supper.     metoprolol succinate 25 MG 24 hr tablet  Commonly known as:  TOPROL-XL  Take 12.5 mg by mouth 2 (two) times daily.     pantoprazole 40 MG tablet  Commonly known as:  PROTONIX  Take 1 tablet (40 mg total) by mouth daily.     potassium chloride SA 20 MEQ tablet  Commonly known as:  K-DUR,KLOR-CON  Take 20 mEq by mouth every morning.     pravastatin 20 MG tablet  Commonly known as:  PRAVACHOL  Take 1 tablet (20 mg total) by mouth daily after supper.  Start taking on:  07/10/2013     sildenafil 100 MG tablet  Commonly known as:  VIAGRA  Take 100 mg by mouth daily as needed for erectile dysfunction.     spironolactone 25 MG tablet  Commonly known as:  ALDACTONE  Take 25 mg by mouth daily as needed (For fluid  retention.).     warfarin 5 MG tablet  Commonly known as:  COUMADIN  Take 0.5-1 tablets (2.5-5 mg total) by mouth 2 (two) times daily. Take 2.5 mg daily for tues, thurs,sat   And  5mg  sun, mon,wed,fri  Start taking on:  07/10/2013       Allergies  Allergen Reactions  . Lisinopril Swelling    Tongue swelling   Follow-up Information   Follow up with Aura Dials, MD. (hospital follow up 2 week.)    Specialty:  Family Medicine   Contact information:   [address] [place] [address] [phone]       Follow up with  Arvilla Meres, MD. (hospital follow up. Wenesday sept 10th 11:40 am)    Specialty:  Cardiology   Contact information:   [address] [place] [address] [phone]        The results of significant diagnostics from this hospitalization (including imaging, microbiology, ancillary and laboratory) are listed below for reference.    Significant Diagnostic Studies: Ct Abdomen Pelvis Wo Contrast  07/02/2013   *RADIOLOGY REPORT*  Clinical Data: Small bowel obstruction.  Fever and chills.  CT ABDOMEN AND PELVIS WITHOUT CONTRAST  Technique:  Multidetector CT imaging of the abdomen and pelvis was performed following the standard protocol without intravenous contrast.  Comparison: None.  Findings: Moderate dilatation of small bowel is demonstrated, which contain air fluid levels.  There is also distention and air-fluid levels seen within the the nondependent transverse colon.  No transition point is seen.  This suggests an adynamic ileus rather than a small bowel obstruction.  There is no evidence of bowel wall thickening or focal inflammatory process.  No evidence of abscess, free fluid, or free air.  Cholelithiasis is demonstrated, without evidence of cholecystitis. A feeding tube is seen with tip in the proximal duodenum.  Noncontrast images of the liver, spleen, pancreas, and adrenal glands are unremarkable.  A small right renal cyst  noted, however kidneys otherwise unremarkable.  No evidence of hydronephrosis.  No soft tissue masses or lymphadenopathy identified.  IMPRESSION:  1.  Moderate dilatation of small bowel and nondependent transverse colon, without evidence of transition point.  This favors an adynamic ileus over small bowel obstruction. 2.  No mass or inflammatory process identified. 3. Cholelithiasis.  No radiographic evidence of cholecystitis.   Original Report Authenticated By: Myles Rosenthal, M.D.   Dg Chest 2 View  06/23/2013   *RADIOLOGY REPORT*  Clinical Data: Shortness of breath, weakness, fever  CHEST - 2 VIEW  Comparison: 04/18/2013  Findings: Cardiomegaly evident with central vascular congestion. No superimposed pneumonia or edema.  No effusion or pneumothorax. Trachea is midline.  Mild thoracic degenerative change.  No significant interval change.  IMPRESSION: Stable cardiomegaly without CHF or pneumonia   Original Report Authenticated By: Judie Petit. Miles Costain, M.D.   Dg Abd 1 View  07/07/2013   *RADIOLOGY REPORT*  Clinical Data: Evaluate for ileus  ABDOMEN - 1 VIEW  Comparison: 07/06/2013  Findings: Abnormal small and large bowel dilatation is again noted. Compared with the previous exam the degree of bowel distention is not significantly improved.  No free intraperitoneal air identified.  Gallstones noted.  IMPRESSION:  1.  No change in ileus pattern.   Original Report Authenticated By: Signa Kell, M.D.   Dg Abd 1 View  07/06/2013   *RADIOLOGY REPORT*  Clinical Data: Abdominal pain.  Ileus.  ABDOMEN - 1 VIEW  Comparison: 07/04/2013.  Findings: No definite plain film evidence of free air however these both appear to be the supine films.  If there is concern for free air, decubitus film should be obtained.  The nasogastric tube has been removed.  There is persistent dilation of large and small bowel, most compatible with ileus.  The view of the lower abdomen and pelvis is under penetrated and poor diagnostic quality.   IMPRESSION: Removal of nasogastric tube.  Persistent large and small bowel dilation compatible with ileus.   Original Report Authenticated By: Andreas Newport, M.D.   Dg Abd 1 View  07/04/2013   *RADIOLOGY REPORT*  Clinical Data: Small bowel dilatation  ABDOMEN - 1 VIEW  Comparison: 07/02/2013  Findings: A nasogastric catheter is seen within the stomach.  There are persistent dilated loops of small bowel identified within the mid abdomen.  No free air is seen.  No abnormal mass or abnormal calcifications are noted.  The degree of small bowel dilatation is roughly stable from prior exam.  IMPRESSION: No significant interval change in the small bowel dilatation.   Original Report Authenticated By: Alcide Clever, M.D.   Dg Abd 1 View  07/01/2013   *RADIOLOGY REPORT*  Clinical Data: Abdominal pain and distention.  Abdominal ileus.  ABDOMEN - 1 VIEW  Comparison: 06/30/2013  Findings: Frontal view of the abdomen is obtained on three images due to body habitus.  Small bowel dilatation similar to prior noted, measuring up to 7.6 cm.  Calcified gallstones in the right upper quadrant measure up to 1.6 cm in diameter.  No biliary gas observed.  IMPRESSION:  1.  Nonspecific small bowel dilatation, essentially stable in maximum diameter compared to prior exam. 2.  Cholelithiasis.   Original Report Authenticated By: Gaylyn Rong, M.D.   Dg Abd 1 View  06/30/2013   *RADIOLOGY REPORT*  Clinical Data: Follow up ileus  ABDOMEN - 1 VIEW  Comparison: Prior radiograph 06/29/2013  Findings: Slightly decreased gaseous distension of small bowel and colon throughout the abdomen.  Maximal small by that small bowel diameter measures 7.3 cm compared to 7.8 cm.  No large free air on the supine radiographs.  No acute osseous abnormality.  IMPRESSION:  Slightly improved ileus pattern.  Maximal small bowel diameter measures 7.3 cm today compared to 7.8 cm yesterday.   Original Report Authenticated By: Malachy Moan, M.D.   Dg Abd 1  View  06/29/2013   *RADIOLOGY REPORT*  Clinical Data: Follow up ileus  ABDOMEN - 1 VIEW  Comparison: 06/28/2013  Findings: Improvement in large and small bowel dilatation compared with yesterday.  Findings are most consistent with improving ileus.  Gallstones are noted.  IMPRESSION: Large and small bowel loops remain dilated but improved from the prior study.  Gallstones   Original Report Authenticated By: Janeece Riggers, M.D.   Dg Abd 1 View  06/28/2013   *RADIOLOGY REPORT*  Clinical Data: Follow up ileus  ABDOMEN - 1 VIEW  Comparison: Prior radiographs 06/27/2013  Findings: Persistent diffuse gaseous distension dilatation of both small and large bowel.  No significant interval change over the last 24 hours.  Relative paucity of gas overlying the rectum.  No large free air on this supine series.  No acute osseous abnormality.  IMPRESSION: Persistent ileus pattern.   Original Report Authenticated By: Malachy Moan, M.D.   Dg Abd 1 View  06/27/2013   *RADIOLOGY REPORT*  Clinical Data: Nausea, ileus  ABDOMEN - 1 VIEW  Comparison: June 25, 2013  Findings: There are air-filled dilated small and large bowel loops in the abdomen and pelvis. The caliber of the small bowel loops are slightly increased compared prior exam.  There is no free air.  IMPRESSION: Persistent dilated small large bowel loops.  The small bowel loop caliber are slightly increased compared prior exam.  Developing small bowel obstruction is not excluded.   Original Report Authenticated By: Sherian Rein, M.D.   Dg Abd 1 View  06/25/2013   *RADIOLOGY REPORT*  Clinical Data: Abdominal pain, distension  ABDOMEN - 1 VIEW  Comparison: None.  Findings: Diffuse gaseous distension of small and large bowel. This appearance favors adynamic ileus over small bowel obstruction.  Degenerative changes of the visualized thoracolumbar spine and bilateral hips.  IMPRESSION: Diffuse gaseous distension of small and large bowel.  This appearance favors adynamic ileus  over small bowel obstruction.   Original Report Authenticated By: Charline Bills, M.D.   Dg Chest Port 1 View  06/24/2013   *RADIOLOGY REPORT*  Clinical Data: Dyspnea and wheezing  PORTABLE CHEST - 1 VIEW  Comparison: 06/23/2013  Findings: The cardiac silhouette is mildly enlarged. No mediastinal or hilar masses are noted.  The lungs are clear.  No pleural effusion or pneumothorax.  The bony thorax is intact.  IMPRESSION: No acute cardiopulmonary disease.  No change from the previous day's study.   Original Report Authenticated By: Amie Portland, M.D.   Dg Abd 2 Views  07/02/2013   *RADIOLOGY REPORT*  Clinical Data: Evaluate small bowel dilatation.  ABDOMEN - 2 VIEW  Comparison: 07/01/2013.  Findings: Nasogastric tube is looped in the stomach with the tip seen proximally.  There are multiple dilated loops of small bowel with associated air fluid levels. Suspect some colonic gas in the right upper quadrant.  No rectal gas.  Calcified stones are seen in the right upper quadrant.  Visualized lung bases show prominent extrapleural fat along the lateral aspect of the lower hemithoraces, versus loculated pleural fluid.  IMPRESSION:  1.  Persistent small bowel obstruction. 2.  Cholelithiasis.   Original Report Authenticated By: Leanna Battles, M.D.    Microbiology: Recent Results (from the past 240 hour(s))  CLOSTRIDIUM DIFFICILE BY PCR     Status: None   Collection Time    07/07/13  7:50 PM      Result Value Range Status   C difficile by pcr NEGATIVE  NEGATIVE Final   Comment: Performed at [facility]     Labs: Basic Metabolic Panel:  Recent Labs Lab 07/04/13 0430 07/05/13 0535 07/06/13 0445 07/07/13 0500 07/08/13 0430  NA 135 135 134* 135 134*  K 3.6 3.4* 3.5 3.8 3.6  CL 102 103 101 103 98  CO2 27 22 24 22 27   GLUCOSE 133* 155* 160* 170* 148*  BUN 27* 15 12 12 16   CREATININE 1.50* 1.17 1.15 1.18 1.41*  CALCIUM 8.3* 8.2* 8.5 8.7 8.4   Liver Function Tests:  Recent Labs Lab  07/06/13 0445  AST 47*  ALT 32  ALKPHOS 73  BILITOT 2.1*  PROT 6.5  ALBUMIN 2.5*   No results found for this basename: LIPASE, AMYLASE,  in the last 168 hours No results found for this basename: AMMONIA,  in the last 168 hours CBC:  Recent Labs Lab 07/03/13 0521 07/04/13 0430 07/06/13 0445 07/08/13 0430  WBC 9.5 9.7 6.5 7.9  HGB 11.6* 11.0* 11.5* 11.0*  HCT 33.6* 32.9* 34.3* 33.1*  MCV 93.1 93.5 93.7 93.8  PLT 328 335 360 430*   Cardiac Enzymes: No results found for this basename: CKTOTAL, CKMB, CKMBINDEX, TROPONINI,  in the last 168 hours BNP: BNP (last 3 results)  Recent Labs  06/23/13 1832  PROBNP 2292.0*   CBG:  Recent Labs Lab 07/07/13 0728 07/07/13 1230 07/07/13 1707 07/07/13 2135 07/08/13 0739  GLUCAP 149* 156* 126* 141* 140*       Signed:  FELIZ ORTIZ, Mahlani Berninger  [facility] 07/08/2013, 10:07 AM

## 2013-07-08 NOTE — Progress Notes (Addendum)
ANTICOAGULATION CONSULT NOTE  Pharmacy Consult for Warfarin Indication: atrial fibrillation  Labs:  Recent Labs  07/06/13 0445 07/07/13 0500 07/08/13 0430  HGB 11.5*  --  11.0*  HCT 34.3*  --  33.1*  PLT 360  --  430*  LABPROT 32.4* 33.4* 32.2*  INR 3.31* 3.44* 3.28*  CREATININE 1.15 1.18 1.41*    Assessment: 72 yo M admitted 06/23/13, on chronic anticoagulation with warfarin for A-fib. Patient started on Lovenox bridge on 8/6 d/t subtherapeutic INR and active Afib; Lovenox d/c'd 8/9 when INR was therapeutic.  Home warfarin dose reported as 5mg  MWFSun and 2.5mg  TuThSat. Patient initially received larger warfarin doses than usual in hospital due to subtherapeutic INR on admission.  INR 3.28 today with very slow decline over the past week despite no Coumadin since 8/12. No bleeding reported. No indication for Vitamin K at this time. CBC stable. Scr trended up to 1.41 this AM.   Goal of Therapy:  INR 2-3 Monitor platelets by anticoagulation protocol: Yes   Plan:   Continue to hold Coumadin tonight  Plan discharge today.  Communicated with Dr. David Stall.  Recommended to hold Coumadin tonight.  F/u with PT/INR in 1-2 days then resume Coumadin per clinic once INR < 3.     Geoffry Paradise, PharmD, BCPS Pager: [phone] 8:38 AM Pharmacy #: [phone]

## 2013-07-30 ENCOUNTER — Telehealth (HOSPITAL_COMMUNITY): Payer: Self-pay | Admitting: Adult Health

## 2013-07-30 ENCOUNTER — Ambulatory Visit (HOSPITAL_COMMUNITY)
Admit: 2013-07-30 | Discharge: 2013-07-30 | Disposition: A | Discharge status: Home / Self Care | Payer: MEDICARE | Source: Ambulatory Visit | Attending: Internal Medicine | Admitting: Internal Medicine

## 2013-07-30 VITALS — BP 90/64 | HR 55 | Wt 320.8 lb

## 2013-07-30 DIAGNOSIS — I5033 Acute on chronic diastolic (congestive) heart failure: Secondary | ICD-10-CM

## 2013-07-30 DIAGNOSIS — R0989 Other specified symptoms and signs involving the circulatory and respiratory systems: Secondary | ICD-10-CM | POA: Insufficient documentation

## 2013-07-30 DIAGNOSIS — R0683 Snoring: Secondary | ICD-10-CM

## 2013-07-30 DIAGNOSIS — R0609 Other forms of dyspnea: Secondary | ICD-10-CM | POA: Insufficient documentation

## 2013-07-30 DIAGNOSIS — I5032 Chronic diastolic (congestive) heart failure: Secondary | ICD-10-CM

## 2013-07-30 LAB — BASIC METABOLIC PANEL
BUN: 9 mg/dL (ref 6–23)
Calcium: 8.9 mg/dL (ref 8.4–10.5)
Creatinine, Ser: 0.9 mg/dL (ref 0.50–1.35)
GFR calc non Af Amer: 84 mL/min — ABNORMAL LOW (ref >90)
Glucose, Bld: 138 mg/dL — ABNORMAL HIGH (ref 70–99)
Potassium: 3.4 mEq/L — ABNORMAL LOW (ref 3.5–5.1)

## 2013-07-30 MED ORDER — POTASSIUM CHLORIDE CRYS ER 20 MEQ PO TBCR: 20.0000 meq | EXTENDED_RELEASE_TABLET | Freq: Two times a day (BID) | ORAL | Status: DC

## 2013-07-30 MED ORDER — METOLAZONE 2.5 MG PO TABS: 2.5000 mg | ORAL_TABLET | ORAL | Status: DC | PRN

## 2013-07-30 NOTE — Patient Instructions (Addendum)
Follow up as needed  Take metolazone 2.5 mg for weight of 320 pounds  Do the following things EVERYDAY: 1) Weigh yourself in the morning before breakfast. Write it down and keep it in a log. 2) Take your medicines as prescribed 3) Eat low salt foods-Limit salt (sodium) to 2000 mg per day.  4) Stay as active as you can everyday 5) Limit all fluids for the day to less than 2 liters

## 2013-07-30 NOTE — Telephone Encounter (Signed)
Provided Mr Edwin [name] with lab results  Potassium 3.4   Instructed to increase potassium to 20 meq bid.   Mr Water verbalized understanding.   Violia Knopf 3:16 PM

## 2013-07-31 ENCOUNTER — Encounter: Payer: Self-pay | Admitting: *Deleted

## 2013-08-29 ENCOUNTER — Encounter: Payer: Self-pay | Admitting: Pulmonary Disease

## 2013-08-29 ENCOUNTER — Ambulatory Visit (INDEPENDENT_AMBULATORY_CARE_PROVIDER_SITE_OTHER): Payer: MEDICARE | Admitting: Pulmonary Disease

## 2013-08-29 VITALS — BP 126/82 | HR 67 | Ht 71.0 in | Wt 329.6 lb

## 2013-08-29 DIAGNOSIS — Z23 Encounter for immunization: Secondary | ICD-10-CM

## 2013-08-29 DIAGNOSIS — G4733 Obstructive sleep apnea (adult) (pediatric): Secondary | ICD-10-CM

## 2013-08-29 HISTORY — DX: Obstructive sleep apnea (adult) (pediatric): G47.33

## 2013-08-29 NOTE — Patient Instructions (Signed)
You probably have obstructive sleep apnea Sleep study 

## 2013-08-29 NOTE — Assessment & Plan Note (Signed)
Given excessive daytime somnolence, narrow pharyngeal exam, witnessed apneas & loud snoring, obstructive sleep apnea is very likely & an overnight polysomnogram will be scheduled as a split study. The pathophysiology of obstructive sleep apnea , it's cardiovascular consequences & modes of treatment including CPAP were discused with the patient in detail & they evidenced understanding.  

## 2013-08-29 NOTE — Progress Notes (Signed)
Subjective:    Patient ID: Edwin [name], male    DOB: January 21, 1941, 72 y.o.   MRN: [id number]  HPI  PCP - Boushka Cards - ganji  72 y.o.remote ex-smoker referred for evaluation of obstructive sleep apnea  Hosp adm for CHF/ AKI/ SBO -he was diuresed 10 lbs , referred to the heart failure clinic. Apneas and loud snoring or witnessed during this hospitalization .  PMH of CHF, diabetes mellitus type 2, hyperlipidemia, A. fib on Coumadin ,2D ECHO with EF of 55-60%  Epworth sleepiness score is 15/ 24. He reports excessive daytime sleepiness in various social situations His wife has noted loud snoring and sleeps in a different room. Bedtime is late on 1:30 AM, he has always been a night owl, sleep latency is minimal he sleeps on his back on a recliner for the past year. He reports 3-5 nocturnal awakenings including bathroom visit and is out of bed by 10 AM, denies headaches or dryness of mouth. His current weight is 330 pounds  Past Medical History  Diagnosis Date  . Hypertension     benign essential hypertension  . Diabetes mellitus     NIDDM  . Morbid obesity   . Pure hypercholesterolemia   . Long-term (current) use of anticoagulants   . Varicose veins of legs   . Pulmonary hypertension MODERATE  . Cor pulmonale, chronic   . Chronic right-sided CHF (congestive heart failure)   . Atrial fibrillation CARDIOLOGIST-- DR Jacinto Halim  . Anxiety   . Shortness of breath     with exertion   . Arthritis   . Adynamic ileus   . GERD (gastroesophageal reflux disease)   . Cholelithiasis   . OSA (obstructive sleep apnea) 08/29/2013    Past Surgical History  Procedure Laterality Date  . Cardiovascular stress test  07-19-2012  DR Jacinto Halim    PROMINENT DIAPHRAGMATIC ATTENUATION/ NORMAL LVEF/ LOW RISK STUDY  . Transthoracic echocardiogram  05-16-2012    LOW NORMAL LVEF/ MOD. RV/ MILD HYPOKINESIS/ MOD. PULMONARY HTN/ CHRONIC COR PUMONALE/ NO SIG. CHANGE FROM 12-01-2010  . Mouth surgery    . Benign  mole removal from nose     . Knee arthroscopy with medial menisectomy Left 04/23/2013    Procedure: LEFT KNEE ARTHROSCOPY WITH PARTIAL MEDIAL MENISECTOMY;  Surgeon: Drucilla Schmidt, MD;  Location: [facility];  Service: Orthopedics;  Laterality: Left;  . Cholecystectomy    . Colon surgery    . Small intestine surgery      Allergies  Allergen Reactions  . Lisinopril Swelling    Tongue swelling    History   Social History  . Marital Status: Married    Spouse Name: N/A    Number of Children: N/A  . Years of Education: N/A   Occupational History  . retired    Social History Main Topics  . Smoking status: Former Smoker -- 1.00 packs/day    Types: Cigarettes    Quit date: 06/28/1987  . Smokeless tobacco: Never Used  . Alcohol Use: No     Comment: 28 drinks of vodka per week quit 06/23/2013  . Drug Use: No  . Sexual Activity: Not on file   Other Topics Concern  . Not on file   Social History Narrative  . No narrative on file    Family History  Problem Relation Age of Onset  . Cancer Paternal Grandfather   . Hypertension Paternal Grandfather   . Stroke Paternal Grandfather   . Heart disease Mother   . Congestive  Heart Failure Father   . Heart disease Father   . Kidney cancer Father   . Colon cancer Neg Hx      Review of Systems  Constitutional: Negative for fever and unexpected weight change.  HENT: Positive for trouble swallowing. Negative for congestion, dental problem, ear pain, nosebleeds, postnasal drip, rhinorrhea, sinus pressure, sneezing and sore throat.   Eyes: Negative for redness and itching.  Respiratory: Negative for cough, chest tightness, shortness of breath and wheezing.   Cardiovascular: Positive for palpitations and leg swelling.  Gastrointestinal: Negative for nausea and vomiting.  Genitourinary: Negative for dysuria.  Musculoskeletal: Positive for arthralgias. Negative for joint swelling.  Skin: Negative for rash.  Neurological: Negative for  headaches.  Hematological: Does not bruise/bleed easily.  Psychiatric/Behavioral: Positive for dysphoric mood. The patient is nervous/anxious.        Objective:   Physical Exam  Gen. Pleasant, obese, in no distress, normal affect ENT - no lesions, no post nasal drip, class 2-3 airway Neck: No JVD, no thyromegaly, no carotid bruits Lungs: no use of accessory muscles, no dullness to percussion, decreased without rales or rhonchi  Cardiovascular: Rhythm regular, heart sounds  normal, no murmurs or gallops, no peripheral edema Abdomen: soft and non-tender, no hepatosplenomegaly, BS normal. Musculoskeletal: No deformities, no cyanosis or clubbing Neuro:  alert, non focal, no tremors       Assessment & Plan:

## 2013-09-01 ENCOUNTER — Other Ambulatory Visit (HOSPITAL_COMMUNITY): Payer: Self-pay | Admitting: Cardiology

## 2013-09-09 ENCOUNTER — Encounter: Payer: Self-pay | Admitting: Internal Medicine

## 2013-09-09 ENCOUNTER — Ambulatory Visit (INDEPENDENT_AMBULATORY_CARE_PROVIDER_SITE_OTHER): Payer: MEDICARE | Admitting: Internal Medicine

## 2013-09-09 VITALS — BP 120/70 | HR 68 | Ht 69.29 in | Wt 329.5 lb

## 2013-09-09 DIAGNOSIS — K602 Anal fissure, unspecified: Secondary | ICD-10-CM

## 2013-09-09 DIAGNOSIS — K648 Other hemorrhoids: Secondary | ICD-10-CM

## 2013-09-09 MED ORDER — HYDROCORTISONE ACE-PRAMOXINE 2.5-1 % RE CREA: TOPICAL_CREAM | RECTAL | Status: DC

## 2013-09-09 NOTE — Patient Instructions (Signed)
We have sent the following medications to your pharmacy for you to pick up at your convenience: Analpram  You will be due for a recall colonoscopy in 11/2013. We will send you a reminder in the mail when it gets closer to that time.  CC: Dr Everlene Other

## 2013-09-09 NOTE — Progress Notes (Signed)
Edwin [name] [birth year] MRN [id number]   History of Present Illness:  This is a 72 year old white male who was recently hospitalized with diarrhea and adynamic ileus. A small bowel obstruction was initially suspected. He responded to NG decompression and was discharged on 07/05/2013. He has a history of morbid obesity, diabetes and atrial fibrillation. He has been on Coumadin. A CT scan of the abdomen showed a dilated small bowel without transition point. He was C. difficile negative. Today, he complains of hemorrhoidal rectal pain. He denies being constipated. He denies straining.   Past Medical History  Diagnosis Date  . Hypertension     benign essential hypertension  . Diabetes mellitus     NIDDM  . Morbid obesity   . Pure hypercholesterolemia   . Long-term (current) use of anticoagulants   . Varicose veins of legs   . Pulmonary hypertension MODERATE  . Cor pulmonale, chronic   . Chronic right-sided CHF (congestive heart failure)   . Atrial fibrillation CARDIOLOGIST-- DR Jacinto Halim  . Anxiety   . Shortness of breath     with exertion   . Arthritis   . Adynamic ileus   . GERD (gastroesophageal reflux disease)   . Cholelithiasis   . OSA (obstructive sleep apnea) 08/29/2013   Past Surgical History  Procedure Laterality Date  . Cardiovascular stress test  07-19-2012  DR Jacinto Halim    PROMINENT DIAPHRAGMATIC ATTENUATION/ NORMAL LVEF/ LOW RISK STUDY  . Transthoracic echocardiogram  05-16-2012    LOW NORMAL LVEF/ MOD. RV/ MILD HYPOKINESIS/ MOD. PULMONARY HTN/ CHRONIC COR PUMONALE/ NO SIG. CHANGE FROM 12-01-2010  . Mouth surgery    . Benign mole removal from nose     . Knee arthroscopy with medial menisectomy Left 04/23/2013    Procedure: LEFT KNEE ARTHROSCOPY WITH PARTIAL MEDIAL MENISECTOMY;  Surgeon: Drucilla Schmidt, MD;  Location: [facility];  Service: Orthopedics;  Laterality: Left;  . Cholecystectomy    . Colon surgery    . Small intestine surgery      reports that he quit smoking  about 26 years ago. His smoking use included Cigarettes. He smoked 1.00 pack per day. He has never used smokeless tobacco. He reports that he does not drink alcohol or use illicit drugs. family history includes Cancer in his paternal grandfather; Congestive Heart Failure in his father; Heart disease in his father and mother; Hypertension in his paternal grandfather; Kidney cancer in his father; Stroke in his paternal grandfather. There is no history of Colon cancer. Allergies  Allergen Reactions  . Lisinopril Swelling    Tongue swelling        Review of Systems: Negative for abdominal pain constipation or diarrhea  The remainder of the 10 point ROS is negative except as outlined in H&P   Physical Exam: General appearance  Well developed, in no distress. Eyes- non icteric. HEENT nontraumatic, normocephalic. Mouth no lesions, tongue papillated, no cheilosis. Neck supple without adenopathy, thyroid not enlarged, no carotid bruits, no JVD. Lungs Clear to auscultation bilaterally. Cor normal S1, normal S2, regular rhythm, no murmur,  quiet precordium. Abdomen: Obese soft nontender. A limited exam due to large size. Rectal: And anoscopic exam reveals external hemorrhoids. Normal rectal sphincter tone. Large first-grade internal hemorrhoids, very tender, and superficial anal fissure at 9 o'clock.. Stool is Hemoccult negative. Extremities marked pedal edema, Stasis dermatitis Skin no lesions. Stasis  Hyperpigmentation lower extremities Neurological alert and oriented x 3. Psychological normal mood and affect.  Assessment and Plan:  Problem #32 72 year old white male  with a recent hospitalization for gastroenteritis and adynamic ileus which resolved on NG suction. He has never had a colonoscopy. He is now having symptomatic first-grade hemorrhoids and anal fissure. We will treat him with Analpram cream with an applicator 3 times a day and we will schedule a colonoscopy in the future after his  fissure has healed up. He is unable to use suppositories because of its large size and the fact that the suppositories while in the hospital caused rectal trauma when the nurse tried to insert it.   09/09/2013 Lina Sar

## 2013-09-25 ENCOUNTER — Encounter (HOSPITAL_BASED_OUTPATIENT_CLINIC_OR_DEPARTMENT_OTHER): Payer: MEDICARE

## 2013-11-05 ENCOUNTER — Telehealth: Payer: Self-pay | Admitting: *Deleted

## 2013-11-05 NOTE — Telephone Encounter (Signed)
Dr. Jacinto Halim does not practice with our group and his outpatient practice is not on Epic.  Would suggest sending clearance to him by other means.

## 2013-11-05 NOTE — Telephone Encounter (Signed)
Letter faxed to Dr Jacinto Halim at fax [phone] (their phone number is [phone]). Dr Jacinto Halim will not be back in the office until after Christmas.

## 2013-11-05 NOTE — Telephone Encounter (Signed)
11/05/2013 RE: Edwin [name] DOB: 1940-12-13 MRN: [id number]  Dear Dr Jacinto Halim:   We have scheduled the above patient for a colonoscopy procedure. Our records show that he is on anticoagulation therapy.  Please advise as to whether the patient may come off his therapy of coumadin 5 days prior to the procedure. Please fax back/ or route your reply to Vernia Buff at [phone].   Sincerely,  Vernia Buff

## 2013-11-06 NOTE — Telephone Encounter (Signed)
Dr Jacinto Halim returned our form and states "ok to stop coumadin 5 days prior to colonoscopy. Suggest to use Lovenox bridging. Stop Lovenox day before procedure." I have spoken to patient and he has scheduled an appointment for colonoscopy on January 14, 2014 and previsit on 12/29/13. I have advised him of Dr Verl Dicker recommendations to use a Lovenox bridge prior to colonoscopy. I have scheduled him with an appointment with Dr Jacinto Halim for Lovenox bridging 12/19/12 @ 2:15 pm (spoke with Florentina Addison) since we do not do bridging in our office. Patient is advised of this as well and verbalizes understanding of all of the above.

## 2013-12-29 ENCOUNTER — Ambulatory Visit (AMBULATORY_SURGERY_CENTER): Payer: Self-pay | Admitting: *Deleted

## 2013-12-29 VITALS — Ht 70.0 in | Wt 342.6 lb

## 2013-12-29 DIAGNOSIS — Z1211 Encounter for screening for malignant neoplasm of colon: Secondary | ICD-10-CM

## 2013-12-29 MED ORDER — MOVIPREP 100 G PO SOLR: ORAL | Status: DC

## 2013-12-29 NOTE — Progress Notes (Signed)
Patient denies any allergies to eggs or soy. Patient denies any problems with anesthesia. Patient has instructions on how to take coumadin & Lovenox before procedure given by cardiologist. He verbally understands to follow his instructions.

## 2014-01-14 ENCOUNTER — Encounter: Payer: MEDICARE | Admitting: Internal Medicine

## 2014-01-14 ENCOUNTER — Encounter: Payer: Self-pay | Admitting: Internal Medicine

## 2014-01-14 ENCOUNTER — Ambulatory Visit (AMBULATORY_SURGERY_CENTER): Payer: MEDICARE | Admitting: Internal Medicine

## 2014-01-14 VITALS — BP 97/67 | HR 58 | Temp 97.6°F | Resp 19 | Ht 70.0 in | Wt 342.0 lb

## 2014-01-14 DIAGNOSIS — D689 Coagulation defect, unspecified: Secondary | ICD-10-CM

## 2014-01-14 DIAGNOSIS — D126 Benign neoplasm of colon, unspecified: Secondary | ICD-10-CM

## 2014-01-14 DIAGNOSIS — Z1211 Encounter for screening for malignant neoplasm of colon: Secondary | ICD-10-CM

## 2014-01-14 LAB — GLUCOSE, CAPILLARY
GLUCOSE-CAPILLARY: 125 mg/dL — AB (ref 70–99)
GLUCOSE-CAPILLARY: 131 mg/dL — AB (ref 70–99)

## 2014-01-14 MED ORDER — ENOXAPARIN SODIUM 150 MG/ML ~~LOC~~ SOLN: 150.0000 mg | SUBCUTANEOUS | Status: AC

## 2014-01-14 MED ORDER — SODIUM CHLORIDE 0.9 % IV SOLN
500.0000 mL | INTRAVENOUS | Status: DC
Start: 2014-01-14 — End: 2014-01-14

## 2014-01-14 NOTE — Patient Instructions (Addendum)
Prescription for Lovenox sent to [facility] on [address]. Continue once daily Lovenox until March 1,2015. Begin Coumadin on March 1,2015. Continue with your Coumadin Clinic appointment for PT/INR blood draw on march 4,2015.   YOU HAD AN ENDOSCOPIC PROCEDURE TODAY AT [facility]: Refer to the procedure report that was given to you for any specific questions about what was found during the examination.  If the procedure report does not answer your questions, please call your gastroenterologist to clarify.  If you requested that your care partner not be given the details of your procedure findings, then the procedure report has been included in a sealed envelope for you to review at your convenience later.  YOU SHOULD EXPECT: Some feelings of bloating in the abdomen. Passage of more gas than usual.  Walking can help get rid of the air that was put into your GI tract during the procedure and reduce the bloating. If you had a lower endoscopy (such as a colonoscopy or flexible sigmoidoscopy) you may notice spotting of blood in your stool or on the toilet paper. If you underwent a bowel prep for your procedure, then you may not have a normal bowel movement for a few days.  DIET: Your first meal following the procedure should be a light meal and then it is ok to progress to your normal diet.  A half-sandwich or bowl of soup is an example of a good first meal.  Heavy or fried foods are harder to digest and may make you feel nauseous or bloated.  Likewise meals heavy in dairy and vegetables can cause extra gas to form and this can also increase the bloating.  Drink plenty of fluids but you should avoid alcoholic beverages for 24 hours.  ACTIVITY: Your care partner should take you home directly after the procedure.  You should plan to take it easy, moving slowly for the rest of the day.  You can resume normal activity the day after the procedure however you should NOT DRIVE or  use heavy machinery for 24 hours (because of the sedation medicines used during the test).    SYMPTOMS TO REPORT IMMEDIATELY: A gastroenterologist can be reached at any hour.  During normal business hours, 8:30 AM to 5:00 PM Monday through Friday, call [phone].  After hours and on weekends, please call the GI answering service at [phone] who will take a message and have the physician on call contact you.   Following lower endoscopy (colonoscopy or flexible sigmoidoscopy):  Excessive amounts of blood in the stool  Significant tenderness or worsening of abdominal pains  Swelling of the abdomen that is new, acute  Fever of 100F or higher    FOLLOW UP: If any biopsies were taken you will be contacted by phone or by letter within the next 1-3 weeks.  Call your gastroenterologist if you have not heard about the biopsies in 3 weeks.  Our staff will call the home number listed on your records the next business day following your procedure to check on you and address any questions or concerns that you may have at that time regarding the information given to you following your procedure. This is a courtesy call and so if there is no answer at the home number and we have not heard from you through the emergency physician on call, we will assume that you have returned to your regular daily activities without incident.  SIGNATURES/CONFIDENTIALITY: You and/or your care partner have signed  paperwork which will be entered into your electronic medical record.  These signatures attest to the fact that that the information above on your After Visit Summary has been reviewed and is understood.  Full responsibility of the confidentiality of this discharge information lies with you and/or your care-partner.  Polyp information given.

## 2014-01-14 NOTE — Progress Notes (Signed)
Report to pacu rn, vss, bbs=clear 

## 2014-01-14 NOTE — Progress Notes (Signed)
Called to room to assist during endoscopic procedure.  Patient ID and intended procedure confirmed with present staff. Received instructions for my participation in the procedure from the performing physician.  

## 2014-01-14 NOTE — Progress Notes (Signed)
Escribed Lovenox to [facility] at [address]. Verified with "doug" pharmacist Lovenox is available and prescription received.

## 2014-01-14 NOTE — Op Note (Signed)
[facility]  [address] [place], [id number]   COLONOSCOPY PROCEDURE REPORT  PATIENT: Edwin [name], Edwin [name]  MR#: [id number] BIRTHDATE: 03-30-1941 , 72  yrs. old GENDER: Male ENDOSCOPIST: Lafayette Dragon, MD REFERRED UX:LKGMW Bouska, M.D., Dr Einar Gip PROCEDURE DATE:  01/14/2014 PROCEDURE:   Colonoscopy with cold biopsy polypectomy and Colonoscopy with snare polypectomy First Screening Colonoscopy - Avg.  risk and is 50 yrs.  old or older Yes.  Prior Negative Screening - Now for repeat screening. N/A  History of Adenoma - Now for follow-up colonoscopy & has been > or = to 3 yrs.  N/A  Polyps Removed Today? Yes. ASA CLASS:   Class III INDICATIONS:Average risk patient for colon cancer and St.  for anal fissure.  S3 or atrial fibrillation.  Coumadin discontinued 5 days ago.  Patient is being arranged with Lovenox. MEDICATIONS: MAC sedation, administered by CRNA and Propofol (Diprivan) 330 mg IV  DESCRIPTION OF PROCEDURE:   After the risks benefits and alternatives of the procedure were thoroughly explained, informed consent was obtained.  A digital rectal exam revealed no abnormalities of the rectum.   The LB [id number] and LB [id number]  endoscope was introduced through the anus and advanced to the cecum, which was identified by both the appendix and ileocecal valve. No adverse events experienced.   The quality of the prep was good, using MoviPrep  The instrument was then slowly withdrawn as the colon was fully examined.      COLON FINDINGS: Three polypoid shaped sessile polyps ranging between 5-49mm in size were found in the sigmoid colon at 15 cm x1 and ascending colon.x2  A polypectomy was performed with cold forceps, at 15 cm,with a cold snare and using snare cautery.  The resection was complete and the polyp tissue was completely retrieved.   There was mild diverticulosis noted in the sigmoid colon with associated muscular hypertrophy.  Retroflexed views  revealed no abnormalities. The time to cecum=11 minutes 03 seconds.  Withdrawal time=8 minutes 06 seconds.  The scope was withdrawn and the procedure completed. COMPLICATIONS: There were no complications.  ENDOSCOPIC IMPRESSION: 1.   Three sessile polyps ranging between 5-85mm in size were found in the sigmoid colon x1 and ascending colon;x2 polypectomy was performed with cold forceps, with a cold snare and using snare cautery 2.   There was mild diverticulosis noted in the sigmoid colon  RECOMMENDATIONS: 1.  Await pathology results 2.  resume Lovenox today continue x5days Restart Coumadin on January 18 2014 recall colonoscopy pending path report   eSigned:  Lafayette Dragon, MD 01/14/2014 12:03 PM   cc:   PATIENT NAME:  Edwin [name], Edwin [name] MR#: [id number]

## 2014-01-16 ENCOUNTER — Telehealth: Payer: Self-pay | Admitting: *Deleted

## 2014-01-16 NOTE — Telephone Encounter (Signed)
  Follow up Call-  Call back number 01/14/2014  Post procedure Call Back phone  # [phone] hm  Permission to leave phone message Yes     Patient questions:  Do you have a fever, pain , or abdominal swelling? no Pain Score  0 *  Have you tolerated food without any problems? yes  Have you been able to return to your normal activities? yes  Do you have any questions about your discharge instructions: Diet   no Medications  no Follow up visit  no  Do you have questions or concerns about your Care? no  Actions: * If pain score is 4 or above: No action needed, pain <4.

## 2014-01-21 ENCOUNTER — Encounter: Payer: Self-pay | Admitting: Internal Medicine

## 2014-01-21 ENCOUNTER — Encounter: Payer: Self-pay | Admitting: *Deleted

## 2014-01-23 ENCOUNTER — Telehealth: Payer: Self-pay | Admitting: Internal Medicine

## 2014-01-23 NOTE — Telephone Encounter (Signed)
Spoke with patient and gave him results as per letter on 01/21/14.

## 2014-05-11 ENCOUNTER — Encounter (HOSPITAL_BASED_OUTPATIENT_CLINIC_OR_DEPARTMENT_OTHER): Payer: MEDICARE | Attending: Plastic Surgery

## 2014-05-11 DIAGNOSIS — I890 Lymphedema, not elsewhere classified: Secondary | ICD-10-CM | POA: Diagnosis not present

## 2014-05-11 LAB — GLUCOSE, CAPILLARY: Glucose-Capillary: 135 mg/dL — ABNORMAL HIGH (ref 70–99)

## 2014-05-12 NOTE — Progress Notes (Signed)
[facility]  NAME:  Edwin [name], Edwin [name] NO.:  [id number]  MEDICAL RECORD NO.:  [id number]      DATE OF BIRTH:  26-Apr-1941  PHYSICIAN:  Theodoro Kos, DO       VISIT DATE:  05/11/2014                                  OFFICE VISIT   HISTORY OF PRESENT ILLNESS:  The patient is a 73 year old male with multiple medical conditions who has been referred to [place] from Dermatology for bilateral lower extremity lymphedema.  He has used compression stockings in the past, but stopped using them because of wounds recently.  PAST MEDICAL HISTORY:  Positive for: 1. Cataracts. 2. Anemia. 3. Lymphedema. 4. Sleep apnea. 5. Chest pain. 6. Hyperlipidemia. 7. Atrial fibrillation. 8. Hypertension. 9. Claustrophobia. 10.Diabetes.  SURGERIES:  Knee surgery, colonoscopy and cataract surgery.  ALLERGIES:  LISINOPRIL, swelling of his tongue.  MEDICATIONS:  Lasix, Bactrim, metolazone, hydrocortisone, Cozaar, Glucophage, Zaroxolyn, Lopressor, K-Dur, Pravachol, Viagra, Aldactone and Coumadin.  SOCIAL HISTORY:  He denies smoking.  Has some family support.  REVIEW OF SYSTEMS:  As stated above.  PHYSICAL EXAMINATION:  GENERAL:  He is alert, oriented, cooperative, seems to be a good historian with significant excess weight. HEENT:  His pupils are equal.  His extraocular muscles are intact. CHEST:  His breathing is unlabored. ABDOMEN:  Large but soft.  No tenderness. EXTREMITIES:  His upper extremities have positive pulses, regular rate. His lower extremities, the pulses are palpable.  He has got significant edema in the feet and lower legs with thickening of the skin, hemosiderosis, some drainage with wounds as noted in the notes.  RECOMMENDATION:  Check a prealbumin.  Refer for Vascular Surgery.  Apply for Juxta-Lite.  Apply Unna boots.  Encourage elevation, blood sugar control, multivitamin, vitamin C, zinc, and see him back in a week.     Theodoro Kos,  DO     CS/MEDQ  D:  05/11/2014  T:  05/12/2014  Job:  [id number]

## 2014-05-18 DIAGNOSIS — I890 Lymphedema, not elsewhere classified: Secondary | ICD-10-CM | POA: Diagnosis not present

## 2014-05-18 NOTE — Progress Notes (Signed)
[facility]  NAME:  Edwin [name], Edwin [name] NO.:  [id number]  MEDICAL RECORD NO.:  [id number]      DATE OF BIRTH:  1941/06/16  PHYSICIAN:  Theodoro Kos, DO       VISIT DATE:  05/18/2014                                  OFFICE VISIT   HISTORY OF PRESENT ILLNESS:  The patient is a 73 year old male, who is here for a followup on his bilateral lower extremity chronic venous ulcers and lymphedema.  He had an Retail buyer on this past week, which seems to have helped.  He has decreased swelling and redness.  There has been no change in his medications or social history.  REVIEW OF SYSTEMS:  Otherwise negative.  PHYSICAL EXAMINATION:  GENERAL:  On exam, he is alert, oriented, cooperative, not in any acute distress.  He is pleasant. LUNGS:  His breathing is unlabored. HEART:  His heart rate is regular.  ABDOMEN:  Large, but soft. EXTREMITIES:  Lower extremities have decreased redness, improvement in ulcers, and overall improvement in the swelling.  RECOMMENDATIONS:  To continue with the Unna boots, increase his pre- albumin by protein supplements.  His prealbumin is low at 16.5, continue with elevation of multivitamin.  We will see him back in 1 week.     Theodoro Kos, DO     CS/MEDQ  D:  05/18/2014  T:  05/18/2014  Job:  [id number]

## 2014-05-25 ENCOUNTER — Encounter (HOSPITAL_BASED_OUTPATIENT_CLINIC_OR_DEPARTMENT_OTHER): Payer: MEDICARE | Attending: Plastic Surgery

## 2014-05-25 DIAGNOSIS — I872 Venous insufficiency (chronic) (peripheral): Secondary | ICD-10-CM | POA: Diagnosis not present

## 2014-05-25 DIAGNOSIS — L97809 Non-pressure chronic ulcer of other part of unspecified lower leg with unspecified severity: Secondary | ICD-10-CM | POA: Insufficient documentation

## 2014-05-25 DIAGNOSIS — I890 Lymphedema, not elsewhere classified: Secondary | ICD-10-CM | POA: Insufficient documentation

## 2014-05-25 NOTE — Progress Notes (Signed)
[facility]  NAME:  Edwin [name], Edwin [name] NO.:  [id number]  MEDICAL RECORD NO.:  [id number]      DATE OF BIRTH:  1941/10/12  PHYSICIAN:  Theodoro Kos, DO       VISIT DATE:  05/25/2014                                  OFFICE VISIT   The patient is a 73 year old male with bilateral lower extremity lymphedema chronic who has undergone treatment with Unna boot with some improvement.  He does have a Juxta-Lite but he has not started using them yet.  There is no change in his medication.  He still has the antibiotic and has been encouraged to continue to take.  REVIEW OF SYSTEMS:  Negative.  SOCIAL HISTORY:  Unchanged.  PHYSICAL EXAMINATION:  GENERAL:  He is alert, oriented, and cooperative. He is very pleasant. HEENT:  Pupils are equal.  Extraocular muscles are intact.  No cervical lymphadenopathy. LUNGS:  His breathing is unlabored. HEART:  His heart rate is regular.  He is showing some signs of improvement in the drainage.  It does not appear to be any worse and it is a little less swollen with less red than it had been before.  We will do an Unna boot on the right and the Juxta-Lite on the left.  We will see him back in a week.     Theodoro Kos, DO     CS/MEDQ  D:  05/25/2014  T:  05/25/2014  Job:  [id number]

## 2014-06-01 DIAGNOSIS — I890 Lymphedema, not elsewhere classified: Secondary | ICD-10-CM | POA: Diagnosis not present

## 2014-06-01 DIAGNOSIS — L97809 Non-pressure chronic ulcer of other part of unspecified lower leg with unspecified severity: Secondary | ICD-10-CM | POA: Diagnosis not present

## 2014-06-01 DIAGNOSIS — I872 Venous insufficiency (chronic) (peripheral): Secondary | ICD-10-CM | POA: Diagnosis not present

## 2014-06-01 LAB — GLUCOSE, CAPILLARY: Glucose-Capillary: 118 mg/dL — ABNORMAL HIGH (ref 70–99)

## 2014-06-02 NOTE — Progress Notes (Signed)
[facility]  NAME:  Edwin [name], GANAWAY NO.:  [id number]  MEDICAL RECORD NO.:  [id number]      DATE OF BIRTH:  03-17-1941  PHYSICIAN:  Irene Limbo, MD         VISIT DATE:                                  OFFICE VISIT   The patient is here for followup of bilateral lower extremity ulcerations in the setting of lymphedema and venous stasis.  The patient was initially referred by his dermatologist; these are the first ulcerations that he has developed.  At his last visit, he had Juxta-Lite placed over his left lower extremity and Unna boot placed over his right lower extremity.  On presentation today, he has had recurrent wound over the left lower extremity and the right wound has since healed.  The patient has not had referral for venous workup and ABIs were unobtainable in the clinic.  PHYSICAL EXAMINATION:  Blood pressure is 123/76, pulse is 73, temperature is 97.6, blood glucose is 118, height is 5 feet and 11 inches, weight is 350 pounds.  Right calf circumference is 47.5 cm, right ankle is 31.5 cm, left calf is 48.5 cm, left ankle is 30 cm. Right lower extremity wounds have completely healed at this time.  Left lateral lower extremity has a wound measured as 1.3 x 0.8 x 0.4 cm. After application of topical anesthetic, curette was used to remove all the superficial slough for selective debridement over the entirety of the wound.  We will plan to institute alginate and the patient will resume of Unna boot over the right lower extremity and will switch to Juxta-Lite compression over the opposite lower extremity.  We will follow up in 1 week's time.          ______________________________ Irene Limbo, MD     BT/MEDQ  D:  06/01/2014  T:  06/02/2014  Job:  [id number]

## 2014-06-08 DIAGNOSIS — I890 Lymphedema, not elsewhere classified: Secondary | ICD-10-CM | POA: Diagnosis not present

## 2014-06-08 DIAGNOSIS — L97809 Non-pressure chronic ulcer of other part of unspecified lower leg with unspecified severity: Secondary | ICD-10-CM | POA: Diagnosis not present

## 2014-06-08 DIAGNOSIS — I872 Venous insufficiency (chronic) (peripheral): Secondary | ICD-10-CM | POA: Diagnosis not present

## 2014-06-08 IMAGING — CR DG ABDOMEN 1V
4 series · 4 of 4 positions shown · non-contrast
Comparison: 07/02/2013

CLINICAL DATA: Small bowel dilatation

ABDOMEN - 1 VIEW

[t abdomen supine (1 of 4)]
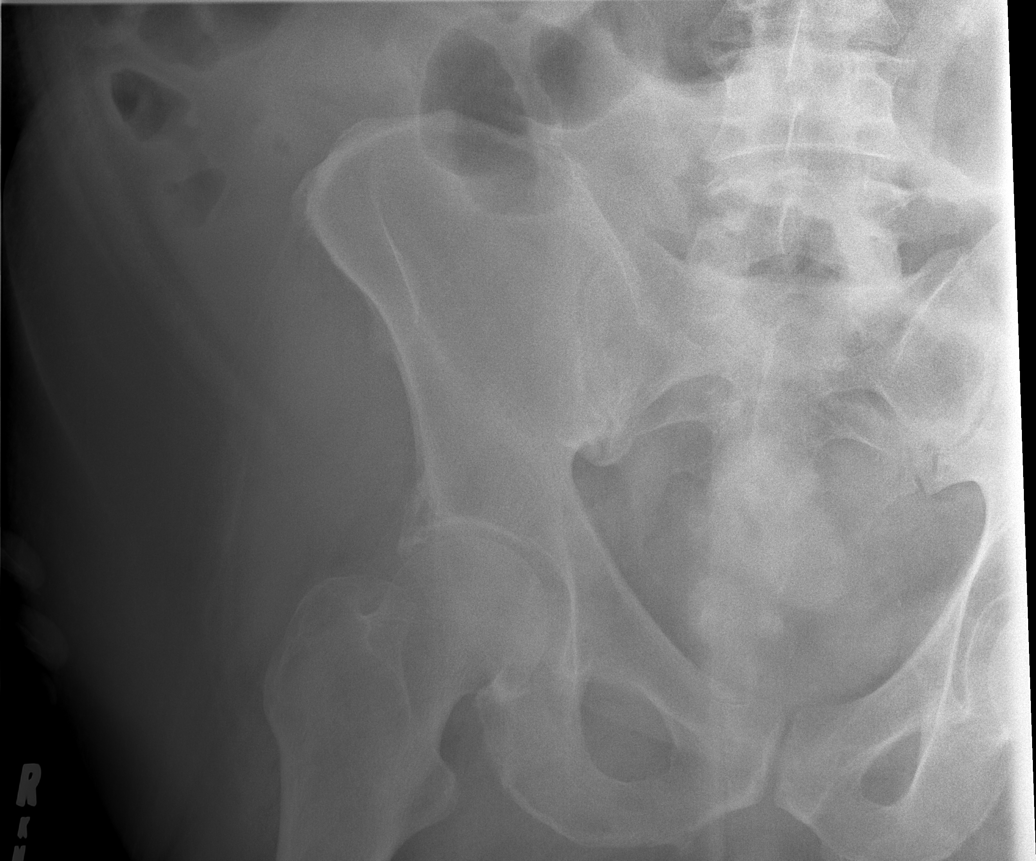

[t abdomen supine (2 of 4)]
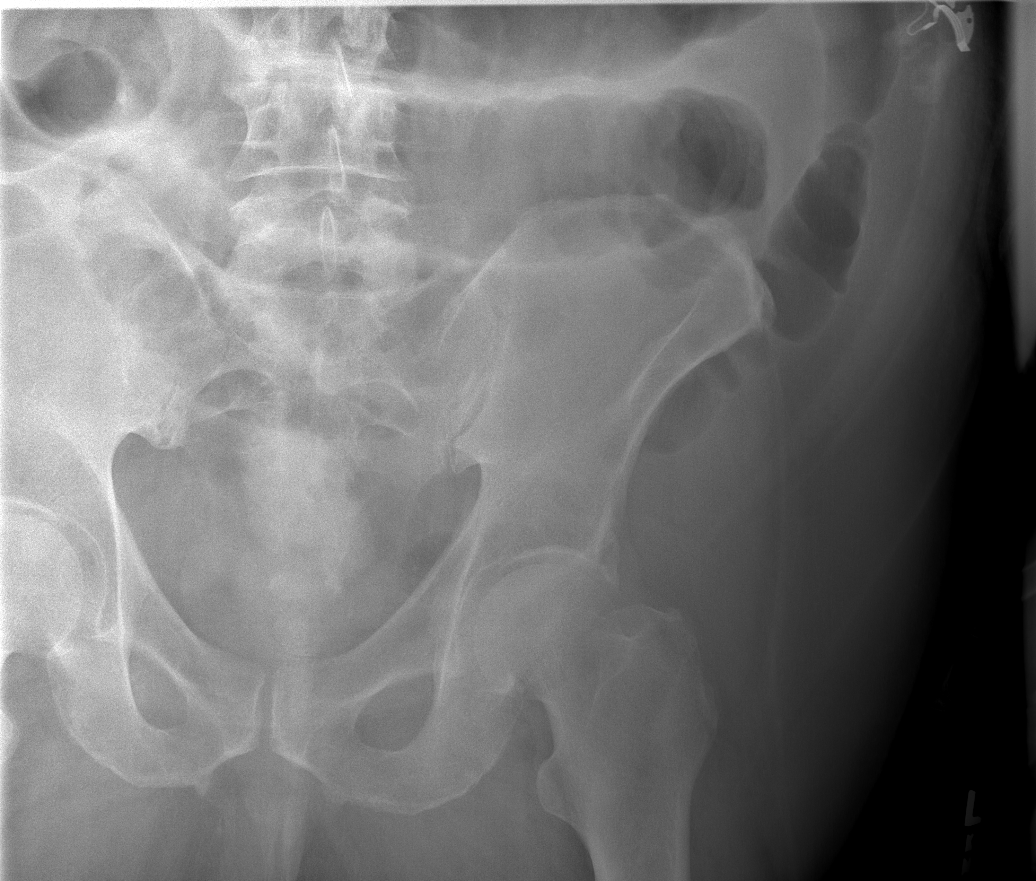

[t abdomen supine (3 of 4)]
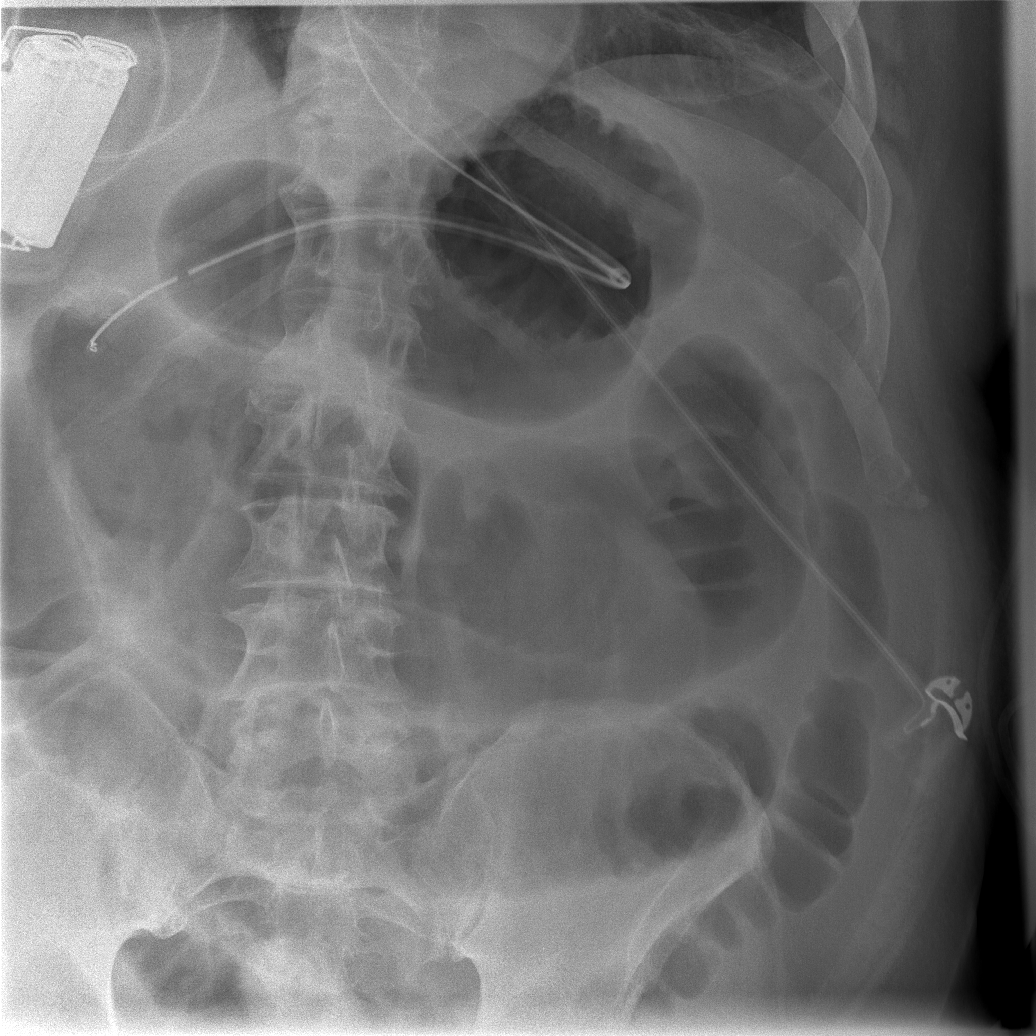

[t abdomen supine (4 of 4)]
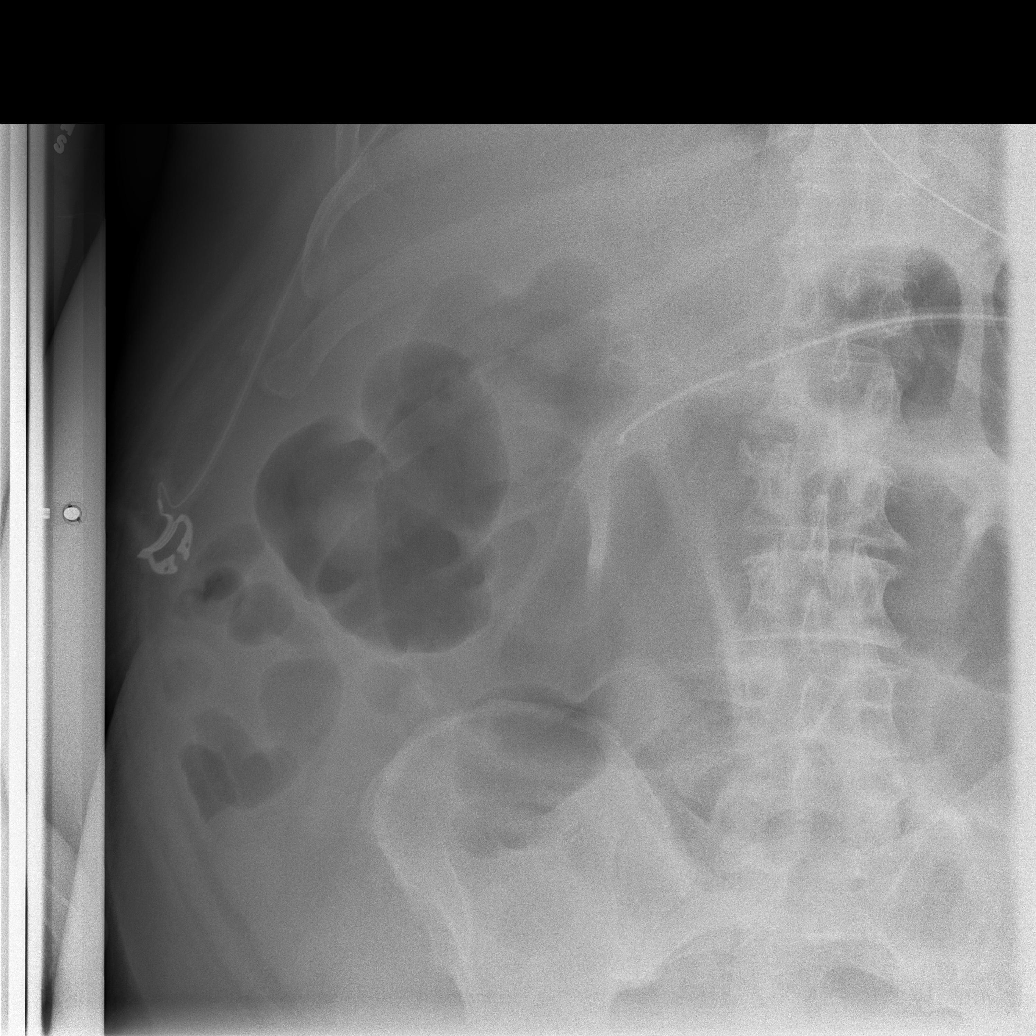

[4 of 4 positions shown; findings below may reference images not displayed]

FINDINGS: A nasogastric catheter is seen within the stomach.  There
are persistent dilated loops of small bowel identified within the
mid abdomen.  No free air is seen.  No abnormal mass or abnormal
calcifications are noted.  The degree of small bowel dilatation is
roughly stable from prior exam.
IMPRESSION: No significant interval change in the small bowel dilatation.

## 2014-06-11 IMAGING — CR DG ABDOMEN 1V
3 series · 3 of 3 positions shown · non-contrast
Comparison: 07/06/2013

CLINICAL DATA: Evaluate for ileus

ABDOMEN - 1 VIEW

[t abdomen supine (1 of 3)]
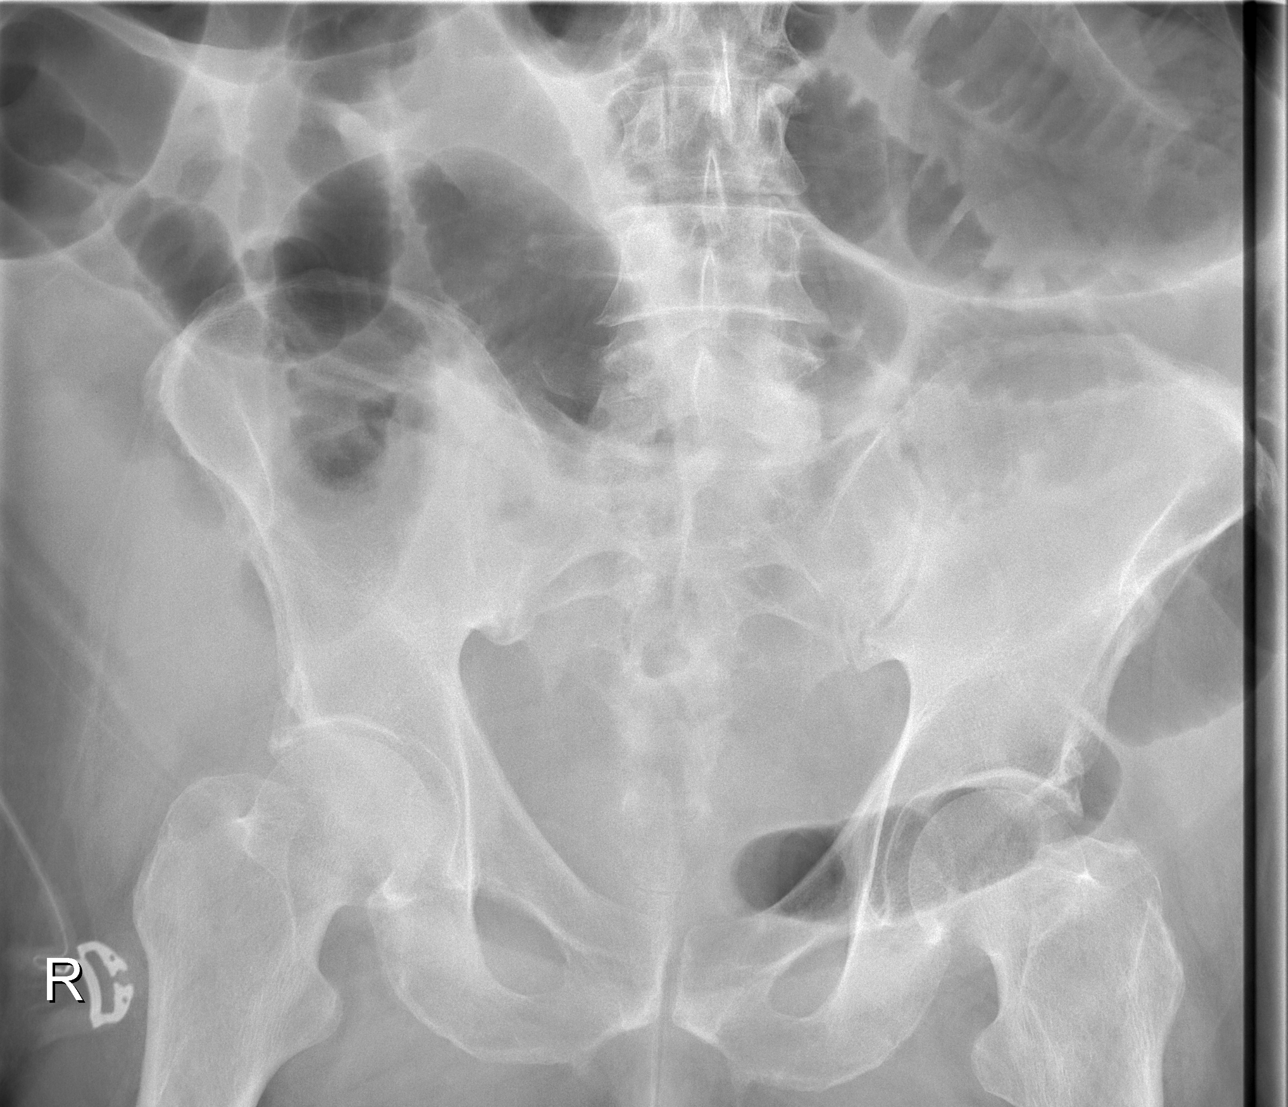

[t abdomen supine (2 of 3)]
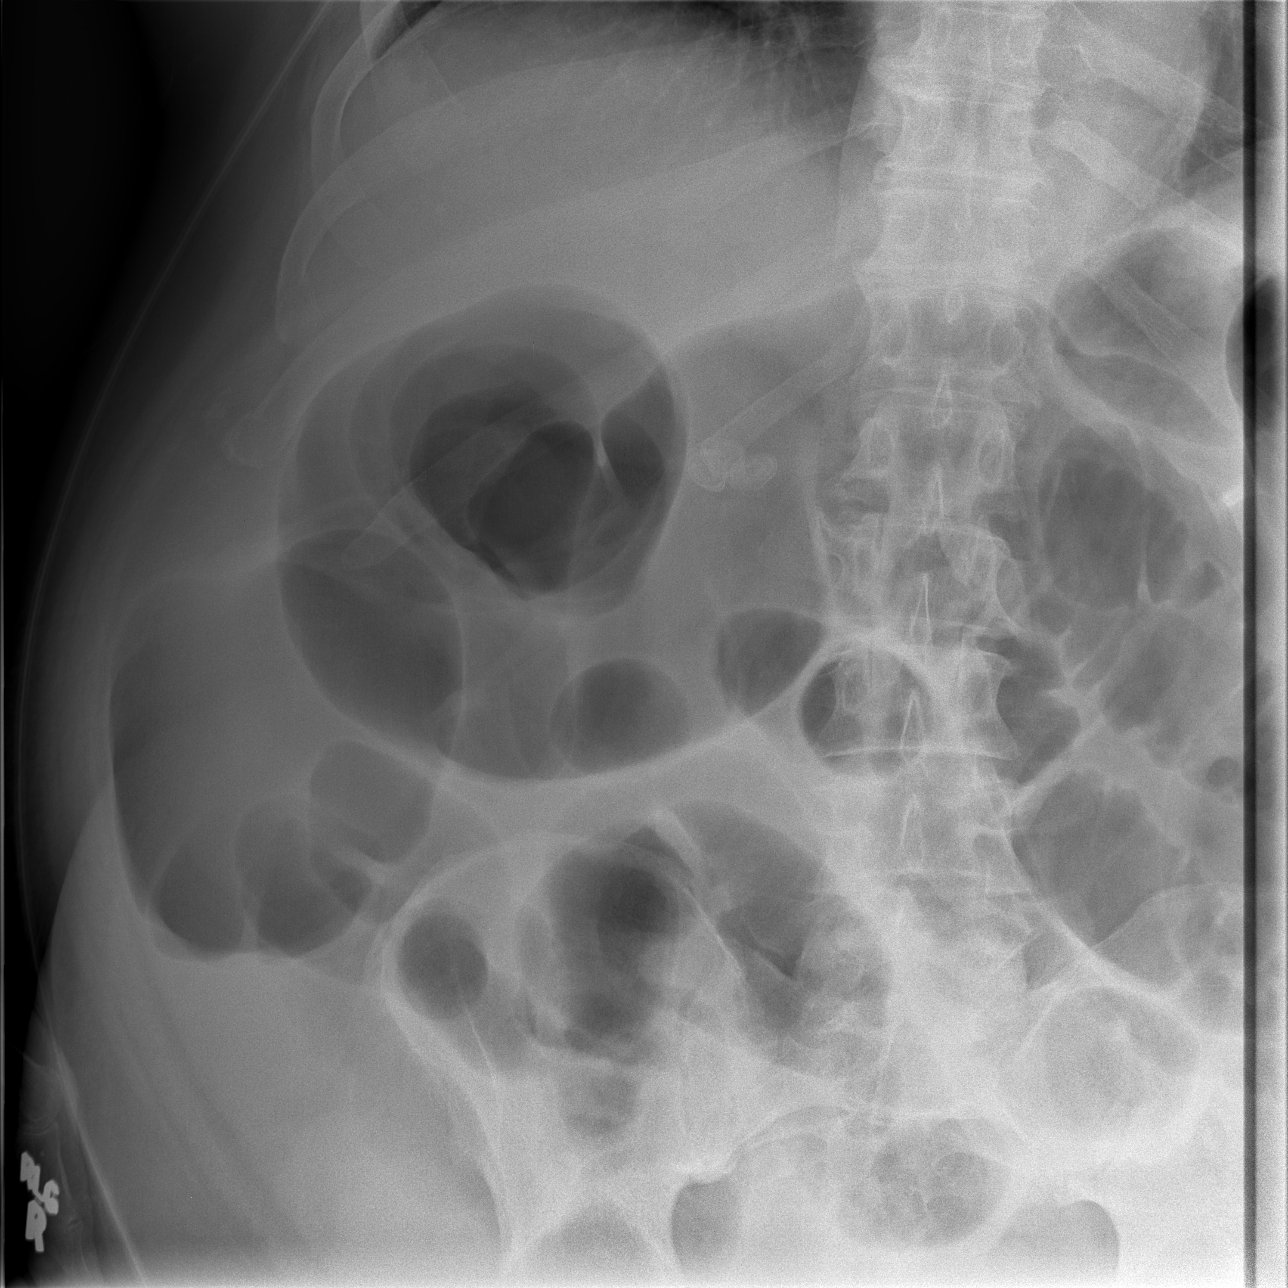

[t abdomen supine (3 of 3)]
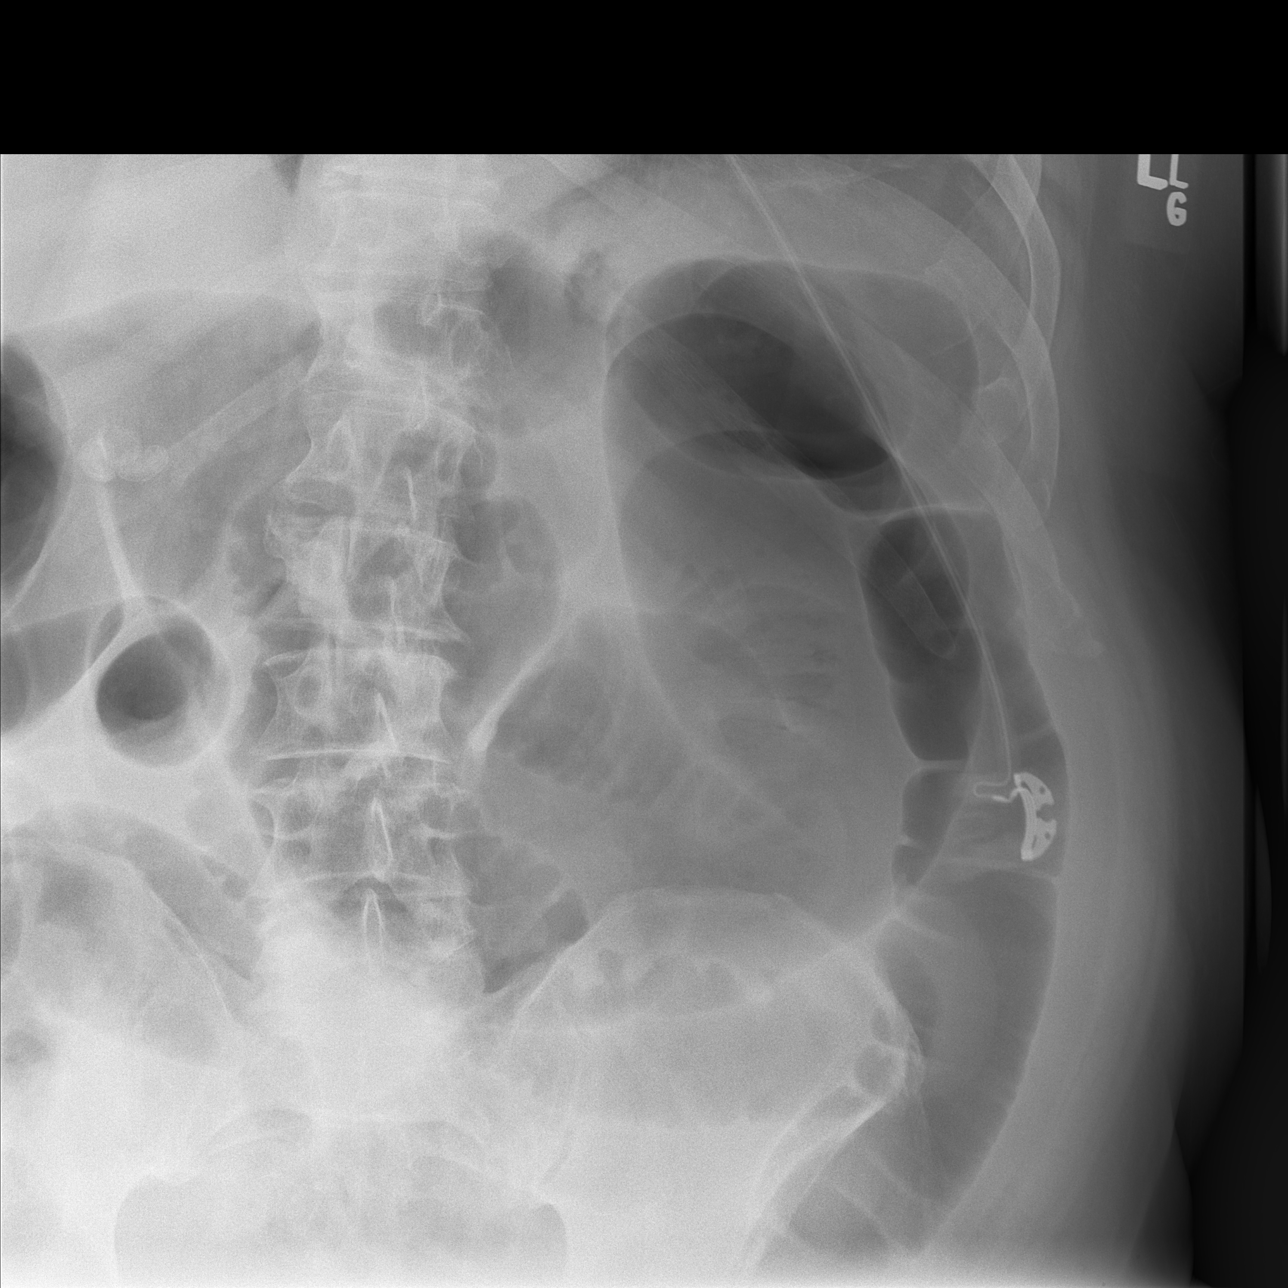

[3 of 3 positions shown; findings below may reference images not displayed]

FINDINGS: Abnormal small and large bowel dilatation is again noted.
Compared with the previous exam the degree of bowel distention is
not significantly improved.  No free intraperitoneal air
identified.  Gallstones noted.
IMPRESSION: 1.  No change in ileus pattern.

## 2014-06-15 DIAGNOSIS — L97809 Non-pressure chronic ulcer of other part of unspecified lower leg with unspecified severity: Secondary | ICD-10-CM | POA: Diagnosis not present

## 2014-06-15 DIAGNOSIS — I872 Venous insufficiency (chronic) (peripheral): Secondary | ICD-10-CM | POA: Diagnosis not present

## 2014-06-15 DIAGNOSIS — I890 Lymphedema, not elsewhere classified: Secondary | ICD-10-CM | POA: Diagnosis not present

## 2014-06-16 NOTE — Progress Notes (Signed)
[facility]  NAME:  KAMRON, VANWYHE NO.:  [id number]  MEDICAL RECORD NO.:  [id number]      DATE OF BIRTH:  February 08, 1941  PHYSICIAN:  Theodoro Kos, DO       VISIT DATE:  06/15/2014                                  OFFICE VISIT   The patient is a 73 year old who is here for followup on bilateral lower extremity venous insufficiency, lymphedema.  He has done extremely well and has healed all the open areas.  He has not had the Vascular consult yet and we are going to look into that.  There has been no change in his medications or social history.  He has been treated with Unna boots and has done extremely well.  Review of systems is negative.  On exam, he is alert, oriented, cooperative, not in any distress.  He is pleasant.  Pupils are equal.  Breathing is unlabored.  Heart rate is regular.  Abdomen is large, but soft.  He has healed the lower extremity.  He has got a lot of hemosiderosis and varicose veins.  As expected, we will do the Juxta-Lite.  Follow up with Vascular Surgery. Continue with elevation and look into home compression pumps.     Theodoro Kos, DO     CS/MEDQ  D:  06/15/2014  T:  06/16/2014  Job:  [id number]

## 2014-06-22 ENCOUNTER — Encounter (HOSPITAL_BASED_OUTPATIENT_CLINIC_OR_DEPARTMENT_OTHER): Payer: MEDICARE

## 2014-07-03 ENCOUNTER — Other Ambulatory Visit (HOSPITAL_COMMUNITY): Payer: Self-pay | Admitting: Plastic Surgery

## 2014-07-03 ENCOUNTER — Ambulatory Visit (HOSPITAL_COMMUNITY)
Admission: RE | Admit: 2014-07-03 | Discharge: 2014-07-03 | Disposition: A | Discharge status: Home / Self Care | Payer: MEDICARE | Source: Ambulatory Visit | Attending: Vascular Surgery | Admitting: Vascular Surgery

## 2014-07-03 ENCOUNTER — Ambulatory Visit (INDEPENDENT_AMBULATORY_CARE_PROVIDER_SITE_OTHER)
Admission: RE | Admit: 2014-07-03 | Discharge: 2014-07-03 | Disposition: A | Discharge status: Home / Self Care | Payer: MEDICARE | Source: Ambulatory Visit | Attending: Vascular Surgery | Admitting: Vascular Surgery

## 2014-07-03 DIAGNOSIS — L98499 Non-pressure chronic ulcer of skin of other sites with unspecified severity: Secondary | ICD-10-CM

## 2014-07-03 DIAGNOSIS — IMO0002 Reserved for concepts with insufficient information to code with codable children: Secondary | ICD-10-CM

## 2014-07-26 ENCOUNTER — Inpatient Hospital Stay (HOSPITAL_COMMUNITY): Payer: MEDICARE

## 2014-07-26 ENCOUNTER — Encounter (HOSPITAL_COMMUNITY): Payer: Self-pay | Admitting: Emergency Medicine

## 2014-07-26 ENCOUNTER — Inpatient Hospital Stay (HOSPITAL_COMMUNITY)
Admission: EM | Admit: 2014-07-26 | Discharge: 2014-07-29 | DRG: 378 | Disposition: A | Discharge status: Home / Self Care | Payer: MEDICARE | Attending: Family Medicine | Admitting: Family Medicine

## 2014-07-26 DIAGNOSIS — K253 Acute gastric ulcer without hemorrhage or perforation: Secondary | ICD-10-CM

## 2014-07-26 DIAGNOSIS — I890 Lymphedema, not elsewhere classified: Secondary | ICD-10-CM | POA: Diagnosis present

## 2014-07-26 DIAGNOSIS — Z7901 Long term (current) use of anticoagulants: Secondary | ICD-10-CM | POA: Diagnosis not present

## 2014-07-26 DIAGNOSIS — K2981 Duodenitis with bleeding: Secondary | ICD-10-CM

## 2014-07-26 DIAGNOSIS — E119 Type 2 diabetes mellitus without complications: Secondary | ICD-10-CM | POA: Diagnosis present

## 2014-07-26 DIAGNOSIS — F329 Major depressive disorder, single episode, unspecified: Secondary | ICD-10-CM | POA: Diagnosis present

## 2014-07-26 DIAGNOSIS — K219 Gastro-esophageal reflux disease without esophagitis: Secondary | ICD-10-CM | POA: Diagnosis present

## 2014-07-26 DIAGNOSIS — E785 Hyperlipidemia, unspecified: Secondary | ICD-10-CM

## 2014-07-26 DIAGNOSIS — I498 Other specified cardiac arrhythmias: Secondary | ICD-10-CM | POA: Diagnosis present

## 2014-07-26 DIAGNOSIS — K921 Melena: Secondary | ICD-10-CM | POA: Diagnosis present

## 2014-07-26 DIAGNOSIS — K254 Chronic or unspecified gastric ulcer with hemorrhage: Secondary | ICD-10-CM

## 2014-07-26 DIAGNOSIS — E876 Hypokalemia: Secondary | ICD-10-CM | POA: Diagnosis present

## 2014-07-26 DIAGNOSIS — F3289 Other specified depressive episodes: Secondary | ICD-10-CM | POA: Diagnosis present

## 2014-07-26 DIAGNOSIS — I5032 Chronic diastolic (congestive) heart failure: Secondary | ICD-10-CM

## 2014-07-26 DIAGNOSIS — Z87891 Personal history of nicotine dependence: Secondary | ICD-10-CM

## 2014-07-26 DIAGNOSIS — M129 Arthropathy, unspecified: Secondary | ICD-10-CM | POA: Diagnosis present

## 2014-07-26 DIAGNOSIS — K259 Gastric ulcer, unspecified as acute or chronic, without hemorrhage or perforation: Secondary | ICD-10-CM | POA: Diagnosis present

## 2014-07-26 DIAGNOSIS — G4733 Obstructive sleep apnea (adult) (pediatric): Secondary | ICD-10-CM | POA: Diagnosis present

## 2014-07-26 DIAGNOSIS — Z79899 Other long term (current) drug therapy: Secondary | ICD-10-CM

## 2014-07-26 DIAGNOSIS — K2270 Barrett's esophagus without dysplasia: Secondary | ICD-10-CM

## 2014-07-26 DIAGNOSIS — F411 Generalized anxiety disorder: Secondary | ICD-10-CM | POA: Diagnosis present

## 2014-07-26 DIAGNOSIS — K274 Chronic or unspecified peptic ulcer, site unspecified, with hemorrhage: Secondary | ICD-10-CM

## 2014-07-26 DIAGNOSIS — K56609 Unspecified intestinal obstruction, unspecified as to partial versus complete obstruction: Secondary | ICD-10-CM

## 2014-07-26 DIAGNOSIS — I4891 Unspecified atrial fibrillation: Secondary | ICD-10-CM

## 2014-07-26 DIAGNOSIS — E662 Morbid (severe) obesity with alveolar hypoventilation: Secondary | ICD-10-CM | POA: Diagnosis present

## 2014-07-26 DIAGNOSIS — K5730 Diverticulosis of large intestine without perforation or abscess without bleeding: Secondary | ICD-10-CM | POA: Diagnosis present

## 2014-07-26 DIAGNOSIS — F1010 Alcohol abuse, uncomplicated: Secondary | ICD-10-CM

## 2014-07-26 DIAGNOSIS — I2789 Other specified pulmonary heart diseases: Secondary | ICD-10-CM | POA: Diagnosis present

## 2014-07-26 DIAGNOSIS — E1165 Type 2 diabetes mellitus with hyperglycemia: Secondary | ICD-10-CM

## 2014-07-26 DIAGNOSIS — R791 Abnormal coagulation profile: Secondary | ICD-10-CM

## 2014-07-26 DIAGNOSIS — D689 Coagulation defect, unspecified: Secondary | ICD-10-CM

## 2014-07-26 DIAGNOSIS — I10 Essential (primary) hypertension: Secondary | ICD-10-CM | POA: Diagnosis present

## 2014-07-26 DIAGNOSIS — Z6841 Body Mass Index (BMI) 40.0 and over, adult: Secondary | ICD-10-CM

## 2014-07-26 DIAGNOSIS — K922 Gastrointestinal hemorrhage, unspecified: Secondary | ICD-10-CM | POA: Diagnosis present

## 2014-07-26 DIAGNOSIS — D6832 Hemorrhagic disorder due to extrinsic circulating anticoagulants: Secondary | ICD-10-CM

## 2014-07-26 DIAGNOSIS — I509 Heart failure, unspecified: Secondary | ICD-10-CM | POA: Diagnosis present

## 2014-07-26 DIAGNOSIS — K560 Paralytic ileus: Secondary | ICD-10-CM

## 2014-07-26 DIAGNOSIS — IMO0001 Reserved for inherently not codable concepts without codable children: Secondary | ICD-10-CM

## 2014-07-26 DIAGNOSIS — K257 Chronic gastric ulcer without hemorrhage or perforation: Secondary | ICD-10-CM

## 2014-07-26 DIAGNOSIS — I482 Chronic atrial fibrillation, unspecified: Secondary | ICD-10-CM

## 2014-07-26 DIAGNOSIS — T45515A Adverse effect of anticoagulants, initial encounter: Secondary | ICD-10-CM | POA: Diagnosis present

## 2014-07-26 DIAGNOSIS — I480 Paroxysmal atrial fibrillation: Secondary | ICD-10-CM

## 2014-07-26 LAB — URINALYSIS, ROUTINE W REFLEX MICROSCOPIC
Bilirubin Urine: NEGATIVE
GLUCOSE, UA: NEGATIVE mg/dL
KETONES UR: 40 mg/dL — AB
Leukocytes, UA: NEGATIVE
Nitrite: NEGATIVE
PH: 5.5 (ref 5.0–8.0)
Protein, ur: 30 mg/dL — AB
Specific Gravity, Urine: 1.015 (ref 1.005–1.030)
Urobilinogen, UA: 1 mg/dL (ref 0.0–1.0)

## 2014-07-26 LAB — RAPID URINE DRUG SCREEN, HOSP PERFORMED
Amphetamines: NOT DETECTED
BENZODIAZEPINES: NOT DETECTED
Barbiturates: NOT DETECTED
Cocaine: NOT DETECTED
Opiates: NOT DETECTED
Tetrahydrocannabinol: NOT DETECTED

## 2014-07-26 LAB — COMPREHENSIVE METABOLIC PANEL
ALT: 58 U/L — ABNORMAL HIGH (ref 0–53)
AST: 92 U/L — ABNORMAL HIGH (ref 0–37)
Albumin: 4 g/dL (ref 3.5–5.2)
Alkaline Phosphatase: 58 U/L (ref 39–117)
Anion gap: 24 — ABNORMAL HIGH (ref 5–15)
BUN: 16 mg/dL (ref 6–23)
CALCIUM: 9 mg/dL (ref 8.4–10.5)
CO2: 24 mEq/L (ref 19–32)
CREATININE: 0.93 mg/dL (ref 0.50–1.35)
Chloride: 93 mEq/L — ABNORMAL LOW (ref 96–112)
GFR, EST NON AFRICAN AMERICAN: 82 mL/min — AB (ref >90)
GLUCOSE: 107 mg/dL — AB (ref 70–99)
Potassium: 3.5 mEq/L — ABNORMAL LOW (ref 3.7–5.3)
Sodium: 141 mEq/L (ref 137–147)
Total Bilirubin: 2 mg/dL — ABNORMAL HIGH (ref 0.3–1.2)
Total Protein: 7.8 g/dL (ref 6.0–8.3)

## 2014-07-26 LAB — CBC WITH DIFFERENTIAL/PLATELET
BASOS PCT: 0 % (ref 0–1)
Basophils Absolute: 0 10*3/uL (ref 0.0–0.1)
Eosinophils Absolute: 0.1 10*3/uL (ref 0.0–0.7)
Eosinophils Relative: 2 % (ref 0–5)
HEMATOCRIT: 37.8 % — AB (ref 39.0–52.0)
HEMOGLOBIN: 13 g/dL (ref 13.0–17.0)
LYMPHS ABS: 1.2 10*3/uL (ref 0.7–4.0)
Lymphocytes Relative: 22 % (ref 12–46)
MCH: 30 pg (ref 26.0–34.0)
MCHC: 34.4 g/dL (ref 30.0–36.0)
MCV: 87.1 fL (ref 78.0–100.0)
MONO ABS: 0.6 10*3/uL (ref 0.1–1.0)
MONOS PCT: 12 % (ref 3–12)
NEUTROS ABS: 3.4 10*3/uL (ref 1.7–7.7)
Neutrophils Relative %: 64 % (ref 43–77)
PLATELETS: 110 10*3/uL — AB (ref 150–400)
RBC: 4.34 MIL/uL (ref 4.22–5.81)
RDW: 14.8 % (ref 11.5–15.5)
WBC: 5.3 10*3/uL (ref 4.0–10.5)

## 2014-07-26 LAB — GLUCOSE, CAPILLARY
GLUCOSE-CAPILLARY: 146 mg/dL — AB (ref 70–99)
Glucose-Capillary: 182 mg/dL — ABNORMAL HIGH (ref 70–99)

## 2014-07-26 LAB — ETHANOL: ALCOHOL ETHYL (B): 104 mg/dL — AB (ref 0–11)

## 2014-07-26 LAB — PROTIME-INR: PROTHROMBIN TIME: 81.9 s — AB (ref 11.6–15.2)

## 2014-07-26 LAB — HEMOGLOBIN A1C
Hgb A1c MFr Bld: 5.6 % (ref <5.7)
MEAN PLASMA GLUCOSE: 114 mg/dL (ref <117)

## 2014-07-26 LAB — LIPASE, BLOOD: Lipase: 45 U/L (ref 11–59)

## 2014-07-26 LAB — OCCULT BLOOD X 1 CARD TO LAB, STOOL: Fecal Occult Bld: POSITIVE — AB

## 2014-07-26 LAB — TSH: TSH: 1.95 u[IU]/mL (ref 0.350–4.500)

## 2014-07-26 LAB — POC OCCULT BLOOD, ED: Fecal Occult Bld: POSITIVE — AB

## 2014-07-26 LAB — URINE MICROSCOPIC-ADD ON

## 2014-07-26 MED ORDER — ONDANSETRON HCL 4 MG PO TABS: 4.0000 mg | ORAL_TABLET | Freq: Four times a day (QID) | ORAL | Status: DC | PRN

## 2014-07-26 MED ORDER — FUROSEMIDE 80 MG PO TABS
80.0000 mg | ORAL_TABLET | Freq: Two times a day (BID) | ORAL | Status: DC
  Administered 2014-07-26 – 2014-07-27 (×3): 80 mg via ORAL
  Filled 2014-07-26 (×4): qty 1

## 2014-07-26 MED ORDER — FOLIC ACID 1 MG PO TABS
1.0000 mg | ORAL_TABLET | Freq: Every day | ORAL | Status: DC
  Administered 2014-07-26 – 2014-07-29 (×4): 1 mg via ORAL
  Filled 2014-07-26 (×4): qty 1

## 2014-07-26 MED ORDER — ACETAMINOPHEN 325 MG PO TABS: 650.0000 mg | ORAL_TABLET | Freq: Four times a day (QID) | ORAL | Status: DC | PRN

## 2014-07-26 MED ORDER — METOPROLOL TARTRATE 12.5 MG HALF TABLET
12.5000 mg | ORAL_TABLET | Freq: Two times a day (BID) | ORAL | Status: DC
  Filled 2014-07-26: qty 1

## 2014-07-26 MED ORDER — SIMVASTATIN 10 MG PO TABS
10.0000 mg | ORAL_TABLET | Freq: Every day | ORAL | Status: DC
  Administered 2014-07-26 – 2014-07-28 (×3): 10 mg via ORAL
  Filled 2014-07-26 (×4): qty 1

## 2014-07-26 MED ORDER — VITAMIN B-1 100 MG PO TABS
100.0000 mg | ORAL_TABLET | Freq: Every day | ORAL | Status: DC
  Administered 2014-07-26 – 2014-07-29 (×4): 100 mg via ORAL
  Filled 2014-07-26 (×3): qty 1

## 2014-07-26 MED ORDER — LORAZEPAM 2 MG/ML IJ SOLN
1.0000 mg | Freq: Four times a day (QID) | INTRAMUSCULAR | Status: DC | PRN
  Administered 2014-07-26 – 2014-07-28 (×7): 1 mg via INTRAVENOUS
  Filled 2014-07-26 (×7): qty 1

## 2014-07-26 MED ORDER — ACETAMINOPHEN 650 MG RE SUPP: 650.0000 mg | Freq: Four times a day (QID) | RECTAL | Status: DC | PRN

## 2014-07-26 MED ORDER — SODIUM CHLORIDE 0.9 % IJ SOLN
3.0000 mL | Freq: Two times a day (BID) | INTRAMUSCULAR | Status: DC
  Administered 2014-07-26 – 2014-07-28 (×6): 3 mL via INTRAVENOUS

## 2014-07-26 MED ORDER — VITAMIN K1 10 MG/ML IJ SOLN
5.0000 mg | Freq: Once | INTRAVENOUS | Status: AC
  Administered 2014-07-26: 5 mg via INTRAVENOUS
  Filled 2014-07-26: qty 0.5

## 2014-07-26 MED ORDER — THIAMINE HCL 100 MG/ML IJ SOLN
100.0000 mg | Freq: Every day | INTRAMUSCULAR | Status: DC
  Filled 2014-07-26 (×3): qty 1

## 2014-07-26 MED ORDER — OXYCODONE HCL 5 MG PO TABS
5.0000 mg | ORAL_TABLET | ORAL | Status: DC | PRN
  Administered 2014-07-27: 5 mg via ORAL
  Filled 2014-07-26: qty 1

## 2014-07-26 MED ORDER — LORAZEPAM 1 MG PO TABS
1.0000 mg | ORAL_TABLET | Freq: Four times a day (QID) | ORAL | Status: DC | PRN
  Administered 2014-07-28 – 2014-07-29 (×3): 1 mg via ORAL
  Filled 2014-07-26 (×3): qty 1

## 2014-07-26 MED ORDER — PANTOPRAZOLE SODIUM 40 MG PO TBEC
40.0000 mg | DELAYED_RELEASE_TABLET | Freq: Two times a day (BID) | ORAL | Status: DC
  Administered 2014-07-26 – 2014-07-29 (×6): 40 mg via ORAL
  Filled 2014-07-26 (×9): qty 1

## 2014-07-26 MED ORDER — ADULT MULTIVITAMIN W/MINERALS CH
1.0000 | ORAL_TABLET | Freq: Every day | ORAL | Status: DC
  Administered 2014-07-26 – 2014-07-29 (×4): 1 via ORAL
  Filled 2014-07-26 (×4): qty 1

## 2014-07-26 MED ORDER — LOSARTAN POTASSIUM 50 MG PO TABS
100.0000 mg | ORAL_TABLET | Freq: Every morning | ORAL | Status: DC
  Administered 2014-07-27 – 2014-07-29 (×3): 100 mg via ORAL
  Filled 2014-07-26 (×3): qty 2

## 2014-07-26 MED ORDER — ONDANSETRON HCL 4 MG/2ML IJ SOLN: 4.0000 mg | Freq: Four times a day (QID) | INTRAMUSCULAR | Status: DC | PRN

## 2014-07-26 MED ORDER — INSULIN ASPART 100 UNIT/ML ~~LOC~~ SOLN
0.0000 [IU] | Freq: Three times a day (TID) | SUBCUTANEOUS | Status: DC
  Administered 2014-07-26: 1 [IU] via SUBCUTANEOUS
  Administered 2014-07-27: 2 [IU] via SUBCUTANEOUS
  Administered 2014-07-27 (×2): 1 [IU] via SUBCUTANEOUS
  Administered 2014-07-28: 2 [IU] via SUBCUTANEOUS
  Administered 2014-07-28: 1 [IU] via SUBCUTANEOUS
  Administered 2014-07-28: 2 [IU] via SUBCUTANEOUS
  Administered 2014-07-29: 1 [IU] via SUBCUTANEOUS

## 2014-07-26 MED ORDER — POTASSIUM CHLORIDE CRYS ER 20 MEQ PO TBCR
20.0000 meq | EXTENDED_RELEASE_TABLET | Freq: Two times a day (BID) | ORAL | Status: DC
  Administered 2014-07-26 – 2014-07-27 (×4): 20 meq via ORAL
  Filled 2014-07-26 (×6): qty 1

## 2014-07-26 NOTE — ED Provider Notes (Signed)
CSN: [id number]     Arrival date & time 07/26/14  0865 History   First MD Initiated Contact with Patient 07/26/14 [phone]     Chief Complaint  Patient presents with  . Rectal Bleeding  . Depression     (Consider location/radiation/quality/duration/timing/severity/associated sxs/prior Treatment) Patient is a 73 y.o. male presenting with hematochezia. The history is provided by the patient.  Rectal Bleeding Quality: dark. Amount:  Moderate Duration:  4 days Timing:  Constant Progression:  Unchanged Chronicity:  New Context: spontaneously   Similar prior episodes: no   Relieved by:  Nothing Worsened by:  Nothing tried Ineffective treatments:  None tried Associated symptoms: no abdominal pain, no fever and no vomiting   Risk factors: anticoagulant use     Past Medical History  Diagnosis Date  . Hypertension     benign essential hypertension  . Diabetes mellitus     NIDDM  . Morbid obesity   . Pure hypercholesterolemia   . Long-term (current) use of anticoagulants   . Varicose veins of legs   . Pulmonary hypertension MODERATE  . Cor pulmonale, chronic   . Chronic right-sided CHF (congestive heart failure)   . Atrial fibrillation CARDIOLOGIST-- DR Einar Gip  . Anxiety   . Shortness of breath     with exertion   . Arthritis   . Adynamic ileus   . GERD (gastroesophageal reflux disease)   . Cholelithiasis   . OSA (obstructive sleep apnea) 08/29/2013   Past Surgical History  Procedure Laterality Date  . Cardiovascular stress test  07-19-2012  DR Einar Gip    PROMINENT DIAPHRAGMATIC ATTENUATION/ NORMAL LVEF/ LOW RISK STUDY  . Transthoracic echocardiogram  05-16-2012    LOW NORMAL LVEF/ MOD. RV/ MILD HYPOKINESIS/ MOD. PULMONARY HTN/ CHRONIC COR PUMONALE/ NO SIG. CHANGE FROM 12-01-2010  . Mouth surgery    . Benign mole removal from nose     . Knee arthroscopy with medial menisectomy Left 04/23/2013    Procedure: LEFT KNEE ARTHROSCOPY WITH PARTIAL MEDIAL MENISECTOMY;  Surgeon: Magnus Sinning, MD;  Location: [facility];  Service: Orthopedics;  Laterality: Left;  . Cholecystectomy    . Colon surgery    . Small intestine surgery     Family History  Problem Relation Age of Onset  . Heart disease Mother   . Hypertension Father   . Colon cancer Neg Hx   . Esophageal cancer Neg Hx   . Prostate cancer Neg Hx   . Rectal cancer Neg Hx   . Stomach cancer Neg Hx    History  Substance Use Topics  . Smoking status: Former Smoker -- 1.00 packs/day    Types: Cigarettes    Quit date: 06/28/1987  . Smokeless tobacco: Never Used  . Alcohol Use: Yes     Comment: pint daily    Review of Systems  Constitutional: Positive for appetite change. Negative for fever.  HENT: Negative for drooling and rhinorrhea.   Eyes: Negative for pain.  Respiratory: Negative for cough and shortness of breath.   Cardiovascular: Negative for chest pain and leg swelling.  Gastrointestinal: Positive for hematochezia. Negative for nausea, vomiting, abdominal pain and diarrhea.  Genitourinary: Negative for dysuria and hematuria.       Gi bleed  Musculoskeletal: Negative for gait problem and neck pain.  Skin: Negative for color change.  Neurological: Negative for numbness and headaches.  Hematological: Negative for adenopathy.  Psychiatric/Behavioral: Negative for behavioral problems.       Depression  All other systems reviewed and  are negative.     Allergies  Lisinopril  Home Medications   Prior to Admission medications   Medication Sig Start Date End Date Taking? Authorizing Provider  furosemide (LASIX) 40 MG tablet Take 2 tablets (80 mg total) by mouth 2 (two) times daily. 07/10/13   Charlynne Cousins, MD  hydrocortisone-pramoxine [facility] SINGLES) 2.5-1 % rectal cream Apply once in the morning, once in the evening and after each bowel movement 09/09/13   Lafayette Dragon, MD  losartan (COZAAR) 50 MG tablet Take 100 mg by mouth every morning.     Historical Provider, MD  metFORMIN  (GLUCOPHAGE) 500 MG tablet Take 500 mg by mouth daily after supper.     Historical Provider, MD  metolazone (ZAROXOLYN) 2.5 MG tablet Take 1 tablet (2.5 mg total) by mouth as needed. Only for weight 320 pounds or greater. 07/30/13   Amy D Ninfa Meeker, NP  metoprolol tartrate (LOPRESSOR) 25 MG tablet Take 12.5 mg by mouth 2 (two) times daily.    Historical Provider, MD  potassium chloride SA (K-DUR,KLOR-CON) 20 MEQ tablet Take 1 tablet (20 mEq total) by mouth 2 (two) times daily. 07/30/13   Amy D Clegg, NP  pravastatin (PRAVACHOL) 20 MG tablet Take 1 tablet (20 mg total) by mouth daily after supper. 07/10/13   Charlynne Cousins, MD  sildenafil (VIAGRA) 100 MG tablet Take 100 mg by mouth daily as needed for erectile dysfunction.     Historical Provider, MD  spironolactone (ALDACTONE) 25 MG tablet Take 25 mg by mouth daily as needed (For fluid retention.).     Historical Provider, MD  warfarin (COUMADIN) 5 MG tablet Take 0.5-1 tablets (2.5-5 mg total) by mouth 2 (two) times daily. Take 2.5 mg daily for tues, thurs,sat   And  5mg  sun, mon,wed,fri 07/10/13   Charlynne Cousins, MD   BP 142/63  Pulse 60  Temp(Src) 97.8 F (36.6 C)  Resp 18  Ht 5\' 11"  (1.803 m)  Wt 313 lb (141.976 kg)  BMI 43.67 kg/m2  SpO2 95% Physical Exam  Nursing note and vitals reviewed. Constitutional: He is oriented to person, place, and time. He appears well-developed and well-nourished.  obese  HENT:  Head: Normocephalic and atraumatic.  Right Ear: External ear normal.  Left Ear: External ear normal.  Nose: Nose normal.  Mouth/Throat: Oropharynx is clear and moist. No oropharyngeal exudate.  Eyes: Conjunctivae and EOM are normal. Pupils are equal, round, and reactive to light.  Neck: Normal range of motion. Neck supple.  Cardiovascular: Normal rate, regular rhythm, normal heart sounds and intact distal pulses.  Exam reveals no gallop and no friction rub.   No murmur heard. Pulmonary/Chest: Effort normal and breath sounds  normal. No respiratory distress. He has no wheezes.  Abdominal: Soft. Bowel sounds are normal. He exhibits no distension. There is no tenderness. There is no rebound and no guarding.  Genitourinary:  Normal appearing external rectum. Dark but brown stool.  Musculoskeletal: Normal range of motion. He exhibits edema (chronic severe nonpitting edema in bilateral lower extremities.). He exhibits no tenderness.  Neurological: He is alert and oriented to person, place, and time.  Skin: Skin is warm and dry.  Psychiatric: He has a normal mood and affect. His behavior is normal.    ED Course  Procedures (including critical care time) Labs Review Labs Reviewed  CBC WITH DIFFERENTIAL - Abnormal; Notable for the following:    HCT 37.8 (*)    Platelets 110 (*)    All other  components within normal limits  COMPREHENSIVE METABOLIC PANEL - Abnormal; Notable for the following:    Potassium 3.5 (*)    Chloride 93 (*)    Glucose, Bld 107 (*)    AST 92 (*)    ALT 58 (*)    Total Bilirubin 2.0 (*)    GFR calc non Af Amer 82 (*)    Anion gap 24 (*)    All other components within normal limits  PROTIME-INR - Abnormal; Notable for the following:    Prothrombin Time 81.9 (*)    INR >10.00 (*)    All other components within normal limits  OCCULT BLOOD X 1 CARD TO LAB, STOOL - Abnormal; Notable for the following:    Fecal Occult Bld POSITIVE (*)    All other components within normal limits  URINALYSIS, ROUTINE W REFLEX MICROSCOPIC - Abnormal; Notable for the following:    Color, Urine AMBER (*)    APPearance CLOUDY (*)    Hgb urine dipstick MODERATE (*)    Ketones, ur 40 (*)    Protein, ur 30 (*)    All other components within normal limits  ETHANOL - Abnormal; Notable for the following:    Alcohol, Ethyl (B) 104 (*)    All other components within normal limits  COMPREHENSIVE METABOLIC PANEL - Abnormal; Notable for the following:    Chloride 91 (*)    CO2 34 (*)    Glucose, Bld 133 (*)    AST  77 (*)    ALT 55 (*)    Total Bilirubin 3.0 (*)    GFR calc non Af Amer 83 (*)    All other components within normal limits  CBC - Abnormal; Notable for the following:    Platelets 96 (*)    All other components within normal limits  PROTIME-INR - Abnormal; Notable for the following:    Prothrombin Time 34.7 (*)    INR 3.45 (*)    All other components within normal limits  GLUCOSE, CAPILLARY - Abnormal; Notable for the following:    Glucose-Capillary 146 (*)    All other components within normal limits  GLUCOSE, CAPILLARY - Abnormal; Notable for the following:    Glucose-Capillary 182 (*)    All other components within normal limits  POC OCCULT BLOOD, ED - Abnormal; Notable for the following:    Fecal Occult Bld POSITIVE (*)    All other components within normal limits  LIPASE, BLOOD  URINE RAPID DRUG SCREEN (HOSP PERFORMED)  HEMOGLOBIN A1C  TSH  URINE MICROSCOPIC-ADD ON  OCCULT BLOOD X 1 CARD TO LAB, STOOL    Imaging Review Dg Chest 2 View  07/26/2014   CLINICAL DATA:  Dyspnea and history of CHF, hypertension and atrial fibrillation. Melena and hematochezia.  EXAM: CHEST - 2 VIEW  COMPARISON:  06/24/2013  FINDINGS: There is stable cardiac enlargement. Lungs show pulmonary venous hypertensive changes without overt edema. No pleural fluid is identified. No airspace consolidation. Stable mild degenerative disease of the thoracic spine.  IMPRESSION: Cardiomegaly and pulmonary venous hypertensive changes without overt edema.   Electronically Signed   By: Aletta Edouard M.D.   On: 07/26/2014 13:47     EKG Interpretation   Date/Time:  Sunday July 26 2014 09:24:29 EDT Ventricular Rate:  62 PR Interval:    QRS Duration: 105 QT Interval:  461 QTC Calculation: 468 R Axis:   3 Text Interpretation:  Atrial fibrillation Ventricular premature complex  Low voltage, precordial leads Borderline T abnormalities, anterior  leads  no previous for comparison Confirmed by Jaymison Luber  MD,  Peregrine Nolt ([id number]) on  07/26/2014 9:27:39 AM      MDM   Final diagnoses:  Gastrointestinal hemorrhage, unspecified gastritis, unspecified gastrointestinal hemorrhage type  Alcohol abuse  Elevated INR    9:22 AM 73 y.o. male w hx of HTN, DM, pulm HTN, afib on coumadin who presents with GI bleeding and worsening alcohol abuse. The patient states that over the last 2-3 weeks he has had increased alcohol consumption. He states that he normally drinks about a pint of vodka per day. He also notes worsening depression do to family issues and stress but denies any suicidality. He states that he has had some dark appearing stools over the last 4 days and saw some bright red blood on the toilet seat last night. He is also had a decreased appetite. He is afebrile and vital signs are unremarkable here. Will get screening labs.  INR >10. Will give some vit K. Pt's stools are dark, but non-bloody. Will admit to hospitalist.    Pamella Pert, MD 07/27/14 [phone]

## 2014-07-26 NOTE — ED Notes (Signed)
Patient reports that he has been drinking a pint of vodka daily for the past 2 weeks. Patient states that he normally drinks daily. Patient states he "wants to get his appetite back."  Patient states he has been been feeling stressed a lot. Patient reports that he saw dark red blood on the toilet seat in the past 2-3 days.

## 2014-07-26 NOTE — H&P (Signed)
History and Physical  Asiah Befort JSH:[id number] DOB: February 02, 1941 DOA: 07/26/2014   PCP: Phineas Inches, MD   Chief Complaint: Hematochezia  HPI:  73 year old male with a history of hypertension, diabetes mellitus, diastolic CHF, atrial fibrillation, and alcohol abuse presented with hematochezia. The patient states that he has had melanotic stools for 3-4 days prior to admission, but he had an episode of hematochezia on 07/25/2014. The patient actually came to the emergency department to get help for his alcohol dependence, but workup revealed the above history with INR >10. The patient denies fevers, chills, dizziness, syncope, shortness of breath, chest discomfort, vomiting, abdominal pain, diarrhea, dysuria, hematuria. He denies any hematemesis. The patient has been drinking slightly over one pint of vodka daily for the past 2 weeks with his last drink around 1 AM on 07/26/2014. Prior to this past 2 weeks, the patient stated that he drank approximately 9 ounces of vodka daily. He was previously in alcohol rehabilitation 5 years ago. He denies any other illegal use. He feels depressed but denies any suicidal or homicidal ideations. He has had poor oral intake for the past week at home due to the poor appetite. He states that he has lost 40 pounds in the past 2-3 months. He endorses compliance with all his medications including his warfarin and furosemide.  In the emergency department, the patient was hemodynamically stable. BMP was unremarkable. Liver enzymes were monitored elevated with AST 92, ALT 58, total bilirubin 2.0, alkaline phosphatase 58. Hemoglobin was 13.0 with platelets 110,000. INR>10. The patient was given 5 mg of vitamin K. Lipase was 45. EKG showed atrial fibrillation with heart rate of 62 with nonspecific T-wave changes. Alcohol level was 104 Assessment/Plan: Melena and hematochezia -Suspect low-grade GI bleed -had colonoscopy 01/14/14 with hyperplastic and adenomatous polyps as  well as diverticulosis (Dr. Olevia Perches) -I have consulted GI to eval--spoke with oncall--Dr. Collene Mares -Trend  hemoglobin -Hemoglobin 13.0 in emergency department -Clear liquid diet -PPI Coagulopathy -Secondary to alcohol abuse in the setting of warfarin use -Vitamin K 5 mg IV was given -INR in the morning -Remained hemodynamically stable without active signs of bleeding presently -Discontinue warfarin -denies any recent abx or new meds within past month Atrial fibrillation -Rate controlled -Continue metoprolol tartrate Chronic diastolic CHF -85/12/7739 echo EF 55-60% -Appears compensated, but difficult to assess due to the patient's lymphedema and body habitus -Continue home dose of furosemide -Repeat echocardiogram -Chest x-ray Alcohol abuse -Alcohol withdrawal protocol -Social worker to provide resources for rehabilitation Diabetes mellitus type 2 -Hemoglobin A1c -NovoLog sliding scale -Discontinue metformin Lower extremity edema -duplex r/o DVT     Past Medical History  Diagnosis Date  . Hypertension     benign essential hypertension  . Diabetes mellitus     NIDDM  . Morbid obesity   . Pure hypercholesterolemia   . Long-term (current) use of anticoagulants   . Varicose veins of legs   . Pulmonary hypertension MODERATE  . Cor pulmonale, chronic   . Chronic right-sided CHF (congestive heart failure)   . Atrial fibrillation CARDIOLOGIST-- DR Einar Gip  . Anxiety   . Shortness of breath     with exertion   . Arthritis   . Adynamic ileus   . GERD (gastroesophageal reflux disease)   . Cholelithiasis   . OSA (obstructive sleep apnea) 08/29/2013   Past Surgical History  Procedure Laterality Date  . Cardiovascular stress test  07-19-2012  DR Einar Gip    PROMINENT DIAPHRAGMATIC ATTENUATION/ NORMAL LVEF/ LOW RISK  STUDY  . Transthoracic echocardiogram  05-16-2012    LOW NORMAL LVEF/ MOD. RV/ MILD HYPOKINESIS/ MOD. PULMONARY HTN/ CHRONIC COR PUMONALE/ NO SIG. CHANGE FROM  12-01-2010  . Mouth surgery    . Benign mole removal from nose     . Knee arthroscopy with medial menisectomy Left 04/23/2013    Procedure: LEFT KNEE ARTHROSCOPY WITH PARTIAL MEDIAL MENISECTOMY;  Surgeon: Magnus Sinning, MD;  Location: [facility];  Service: Orthopedics;  Laterality: Left;  . Cholecystectomy    . Colon surgery    . Small intestine surgery     Social History:  reports that he quit smoking about 27 years ago. His smoking use included Cigarettes. He smoked 1.00 pack per day. He has never used smokeless tobacco. He reports that he drinks alcohol. He reports that he does not use illicit drugs.   Family History  Problem Relation Age of Onset  . Heart disease Mother   . Hypertension Father   . Colon cancer Neg Hx   . Esophageal cancer Neg Hx   . Prostate cancer Neg Hx   . Rectal cancer Neg Hx   . Stomach cancer Neg Hx      Allergies  Allergen Reactions  . Lisinopril Swelling    Tongue swelling      Prior to Admission medications   Medication Sig Start Date End Date Taking? Authorizing Provider  furosemide (LASIX) 40 MG tablet Take 2 tablets (80 mg total) by mouth 2 (two) times daily. 07/10/13  Yes Charlynne Cousins, MD  losartan (COZAAR) 50 MG tablet Take 100 mg by mouth every morning.    Yes Historical Provider, MD  metFORMIN (GLUCOPHAGE) 500 MG tablet Take 500 mg by mouth daily after supper.    Yes Historical Provider, MD  metoprolol tartrate (LOPRESSOR) 25 MG tablet Take 12.5 mg by mouth 2 (two) times daily.   Yes Historical Provider, MD  potassium chloride SA (K-DUR,KLOR-CON) 20 MEQ tablet Take 1 tablet (20 mEq total) by mouth 2 (two) times daily. 07/30/13  Yes Amy D Clegg, NP  pravastatin (PRAVACHOL) 20 MG tablet Take 1 tablet (20 mg total) by mouth daily after supper. 07/10/13  Yes Charlynne Cousins, MD  warfarin (COUMADIN) 5 MG tablet Take 2.5-5 mg by mouth daily.   Yes Historical Provider, MD    Review of Systems:  Constitutional:  No weight loss, night  sweats, Fevers, chills Head&Eyes: No headache.  No vision loss.  No eye pain or scotoma ENT:  No Difficulty swallowing,Tooth/dental problems,Sore throat,  No ear ache, post nasal drip,  Cardio-vascular:  No chest pain, PND,   dizziness, palpitations  GI:  No  abdominal pain,vomiting, diarrhea, loss of appetite,  heartburn, indigestion, Resp:  No shortness of breathat rest. No cough. No coughing up of blood .No wheezing.No chest wall deformity  Skin:  Bilateral erythematous thickened skin on the bilateral lower extremities GU:  no dysuria, change in color of urine, no urgency or frequency. No flank pain.  Musculoskeletal:  No joint pain or swelling. No decreased range of motion. No back pain.  Psych:  No change in mood or affect. Neurologic: No headache, no dysesthesia, no focal weakness, no vision loss. No syncope  Physical Exam: Filed Vitals:   07/26/14 0911 07/26/14 0922  BP: 142/63 142/63  Pulse: 60 55  Temp: 97.8 F (36.6 C) 98.4 F (36.9 C)  Resp: 18 12  Height: 5\' 11"  (1.803 m)   Weight: 141.976 kg (313 lb)   SpO2: 95% 96%  General:  A&O x 3, NAD, nontoxic, pleasant/cooperative Head/Eye: No conjunctival hemorrhage, no icterus, [place]/AT, No nystagmus ENT:  No icterus,  No thrush,  no pharyngeal exudate Neck:  No masses, no lymphadenpathy, no bruits CV:  RRR, no rub, no gallop, no S3 Lung:  Diminished breath sounds at the bases but clear to auscultation Abdomen: soft/NT, +BS, nondistended, no peritoneal signs Ext: No cyanosis, No rashes, No petechiae, No lymphangitis, 3+ LE edema, R>L Neuro: CNII-XII intact, strength 4/5 in bilateral upper and lower extremities, no dysmetria  Labs on Admission:  Basic Metabolic Panel:  Recent Labs Lab 07/26/14 0931  NA 141  K 3.5*  CL 93*  CO2 24  GLUCOSE 107*  BUN 16  CREATININE 0.93  CALCIUM 9.0   Liver Function Tests:  Recent Labs Lab 07/26/14 0931  AST 92*  ALT 58*  ALKPHOS 58  BILITOT 2.0*  PROT 7.8    ALBUMIN 4.0    Recent Labs Lab 07/26/14 0931  LIPASE 45   No results found for this basename: AMMONIA,  in the last 168 hours CBC:  Recent Labs Lab 07/26/14 0931  WBC 5.3  NEUTROABS 3.4  HGB 13.0  HCT 37.8*  MCV 87.1  PLT 110*   Cardiac Enzymes: No results found for this basename: CKTOTAL, CKMB, CKMBINDEX, TROPONINI,  in the last 168 hours BNP: No components found with this basename: POCBNP,  CBG: No results found for this basename: GLUCAP,  in the last 168 hours  Radiological Exams on Admission: No results found.  EKG: Independently reviewed. Afib, nonspecific T-wave changes    Time spent:60 minutes Code Status:   FULL Family Communication:   Wife updated at bedside   Alandria Butkiewicz, DO  [facility] Pager [phone]  If 7PM-7AM, please contact night-coverage [email or web address] Password [email or web address] 07/26/2014, 11:38 AM

## 2014-07-26 NOTE — ED Notes (Signed)
Patient states his last drink was 0100 today.

## 2014-07-26 NOTE — Progress Notes (Addendum)
Pts heart rate mostly stays 40-60, but has been noted to drop to 35 with sleep. Pauses noted, longest being 2.79. Pt is asymptomatic and states his HR has been low with his last few visits to the Dr.  Dr Tat paged and aware,states he will notify pts cardiologist

## 2014-07-26 NOTE — Progress Notes (Signed)
Notified by RN pt has been bradycardic 40-60, but has been noted to drop to 35 with sleep.  Pt is asymptomatic However, pt having intermitten pauses with longest pause 2.79 seconds. Will d/c metoprolol.  Spoke with and consulted the patient's cardiologist--Dr. Einar Gip who agreed to see patient.  Edwin [name]

## 2014-07-27 DIAGNOSIS — IMO0001 Reserved for inherently not codable concepts without codable children: Secondary | ICD-10-CM

## 2014-07-27 DIAGNOSIS — I517 Cardiomegaly: Secondary | ICD-10-CM

## 2014-07-27 DIAGNOSIS — I5032 Chronic diastolic (congestive) heart failure: Secondary | ICD-10-CM

## 2014-07-27 DIAGNOSIS — E1165 Type 2 diabetes mellitus with hyperglycemia: Secondary | ICD-10-CM

## 2014-07-27 DIAGNOSIS — R609 Edema, unspecified: Secondary | ICD-10-CM

## 2014-07-27 DIAGNOSIS — G4733 Obstructive sleep apnea (adult) (pediatric): Secondary | ICD-10-CM

## 2014-07-27 DIAGNOSIS — I509 Heart failure, unspecified: Secondary | ICD-10-CM

## 2014-07-27 DIAGNOSIS — K922 Gastrointestinal hemorrhage, unspecified: Secondary | ICD-10-CM

## 2014-07-27 LAB — COMPREHENSIVE METABOLIC PANEL WITH GFR
ALT: 55 U/L — ABNORMAL HIGH (ref 0–53)
AST: 77 U/L — ABNORMAL HIGH (ref 0–37)
Albumin: 4 g/dL (ref 3.5–5.2)
Alkaline Phosphatase: 59 U/L (ref 39–117)
Anion gap: 14 (ref 5–15)
BUN: 13 mg/dL (ref 6–23)
CO2: 34 meq/L — ABNORMAL HIGH (ref 19–32)
Calcium: 9.3 mg/dL (ref 8.4–10.5)
Chloride: 91 meq/L — ABNORMAL LOW (ref 96–112)
Creatinine, Ser: 0.9 mg/dL (ref 0.50–1.35)
GFR calc Af Amer: >90 mL/min
GFR calc non Af Amer: 83 mL/min — ABNORMAL LOW
Glucose, Bld: 133 mg/dL — ABNORMAL HIGH (ref 70–99)
Potassium: 4.5 meq/L (ref 3.7–5.3)
Sodium: 139 meq/L (ref 137–147)
Total Bilirubin: 3 mg/dL — ABNORMAL HIGH (ref 0.3–1.2)
Total Protein: 7.6 g/dL (ref 6.0–8.3)

## 2014-07-27 LAB — CBC
HCT: 39.1 % (ref 39.0–52.0)
HEMOGLOBIN: 13.2 g/dL (ref 13.0–17.0)
MCH: 30.2 pg (ref 26.0–34.0)
MCHC: 33.8 g/dL (ref 30.0–36.0)
MCV: 89.5 fL (ref 78.0–100.0)
Platelets: 96 10*3/uL — ABNORMAL LOW (ref 150–400)
RBC: 4.37 MIL/uL (ref 4.22–5.81)
RDW: 14.8 % (ref 11.5–15.5)
WBC: 7 10*3/uL (ref 4.0–10.5)

## 2014-07-27 LAB — GLUCOSE, CAPILLARY
GLUCOSE-CAPILLARY: 145 mg/dL — AB (ref 70–99)
Glucose-Capillary: 130 mg/dL — ABNORMAL HIGH (ref 70–99)
Glucose-Capillary: 164 mg/dL — ABNORMAL HIGH (ref 70–99)

## 2014-07-27 LAB — PROTIME-INR
INR: 3.45 — ABNORMAL HIGH (ref 0.00–1.49)
Prothrombin Time: 34.7 s — ABNORMAL HIGH (ref 11.6–15.2)

## 2014-07-27 MED ORDER — FUROSEMIDE 10 MG/ML IJ SOLN
80.0000 mg | Freq: Two times a day (BID) | INTRAMUSCULAR | Status: DC
  Administered 2014-07-27 – 2014-07-29 (×4): 80 mg via INTRAVENOUS
  Filled 2014-07-27 (×6): qty 8

## 2014-07-27 NOTE — Consult Note (Signed)
Cross cover [facility] Reason for Consult: Rectal bleeding/melena with an INR >10. Referring Physician: Dr. Shanon Brow Tat.  Edwin [name] is an 73 y.o. male.  HPI: 73 year old, morbidly obese, white male, with multiple medical issues mentioned belwo, has been on Coumadin for atrial fibrillation and over the last 2 weeks gives a history of drinking about a pint of vodka everyday as he was depressed. He gives a 3-4 day history of melenic stools and an episode of BRBPR on 07/25/14. He denies having any abdominal pain, nausea, vomiting, dysphagia or odynophagia. He actually cam to the ER to get some help with his alcoholism but in the process of his workup was found to have an INR >10. He had lower extremity dopplers this morning to rule out DVT. He has reportedly lost about 40 lbs in the last 4 months as he has had a poor appetite with poor PO intake.   Past Medical History  Diagnosis Date  . Hypertension     benign essential hypertension  . Diabetes mellitus     NIDDM  . Morbid obesity   . Pure hypercholesterolemia   . Long-term (current) use of anticoagulants   . Varicose veins of legs   . Pulmonary hypertension MODERATE  . Cor pulmonale, chronic   . Chronic right-sided CHF (congestive heart failure)   . Atrial fibrillation CARDIOLOGIST-- DR Einar Gip  . Anxiety   . Shortness of breath     with exertion   . Arthritis   . Adynamic ileus   . GERD (gastroesophageal reflux disease)   . Cholelithiasis   . OSA (obstructive sleep apnea) 08/29/2013   Past Surgical History  Procedure Laterality Date  . Cardiovascular stress test  07-19-2012  DR Einar Gip    PROMINENT DIAPHRAGMATIC ATTENUATION/ NORMAL LVEF/ LOW RISK STUDY  . Transthoracic echocardiogram  05-16-2012    LOW NORMAL LVEF/ MOD. RV/ MILD HYPOKINESIS/ MOD. PULMONARY HTN/ CHRONIC COR PUMONALE/ NO SIG. CHANGE FROM 12-01-2010  . Mouth surgery    . Benign mole removal from nose     . Knee arthroscopy with medial menisectomy Left 04/23/2013     Procedure: LEFT KNEE ARTHROSCOPY WITH PARTIAL MEDIAL MENISECTOMY;  Surgeon: Magnus Sinning, MD;  Location: [facility];  Service: Orthopedics;  Laterality: Left;  . Cholecystectomy    . Colon surgery    . Small intestine surgery     Family History  Problem Relation Age of Onset  . Heart disease Mother   . Hypertension Father   . Colon cancer Neg Hx   . Esophageal cancer Neg Hx   . Prostate cancer Neg Hx   . Rectal cancer Neg Hx   . Stomach cancer Neg Hx    Social History:  reports that he quit smoking about 27 years ago. His smoking use included Cigarettes. He smoked 1.00 pack per day. He has never used smokeless tobacco. He reports that he drinks alcohol. He reports that he does not use illicit drugs.  Allergies:  Allergies  Allergen Reactions  . Lisinopril Swelling    Tongue swelling   Medications: I have reviewed the patient's current medications.  Results for orders placed during the hospital encounter of 07/26/14 (from the past 48 hour(s))  OCCULT BLOOD X 1 CARD TO LAB, STOOL     Status: Abnormal   Collection Time    07/26/14  9:21 AM      Result Value Ref Range   Fecal Occult Bld POSITIVE (*) NEGATIVE  POC OCCULT BLOOD, ED  Status: Abnormal   Collection Time    07/26/14  9:24 AM      Result Value Ref Range   Fecal Occult Bld POSITIVE (*) NEGATIVE  CBC WITH DIFFERENTIAL     Status: Abnormal   Collection Time    07/26/14  9:31 AM      Result Value Ref Range   WBC 5.3  4.0 - 10.5 K/uL   RBC 4.34  4.22 - 5.81 MIL/uL   Hemoglobin 13.0  13.0 - 17.0 g/dL   HCT 37.8 (*) 39.0 - 52.0 %   MCV 87.1  78.0 - 100.0 fL   MCH 30.0  26.0 - 34.0 pg   MCHC 34.4  30.0 - 36.0 g/dL   RDW 14.8  11.5 - 15.5 %   Platelets 110 (*) 150 - 400 K/uL   Comment: RESULT REPEATED AND VERIFIED     SPECIMEN CHECKED FOR CLOTS   Neutrophils Relative % 64  43 - 77 %   Lymphocytes Relative 22  12 - 46 %   Monocytes Relative 12  3 - 12 %   Eosinophils Relative 2  0 - 5 %   Basophils Relative 0  0  - 1 %   Neutro Abs 3.4  1.7 - 7.7 K/uL   Lymphs Abs 1.2  0.7 - 4.0 K/uL   Monocytes Absolute 0.6  0.1 - 1.0 K/uL   Eosinophils Absolute 0.1  0.0 - 0.7 K/uL   Basophils Absolute 0.0  0.0 - 0.1 K/uL   Smear Review PLATELET COUNT CONFIRMED BY SMEAR    COMPREHENSIVE METABOLIC PANEL     Status: Abnormal   Collection Time    07/26/14  9:31 AM      Result Value Ref Range   Sodium 141  137 - 147 mEq/L   Potassium 3.5 (*) 3.7 - 5.3 mEq/L   Chloride 93 (*) 96 - 112 mEq/L   CO2 24  19 - 32 mEq/L   Glucose, Bld 107 (*) 70 - 99 mg/dL   BUN 16  6 - 23 mg/dL   Creatinine, Ser 0.93  0.50 - 1.35 mg/dL   Calcium 9.0  8.4 - 10.5 mg/dL   Total Protein 7.8  6.0 - 8.3 g/dL   Albumin 4.0  3.5 - 5.2 g/dL   AST 92 (*) 0 - 37 U/L   ALT 58 (*) 0 - 53 U/L   Alkaline Phosphatase 58  39 - 117 U/L   Total Bilirubin 2.0 (*) 0.3 - 1.2 mg/dL   GFR calc non Af Amer 82 (*) >90 mL/min   GFR calc Af Amer >90  >90 mL/min   Comment: (NOTE)     The eGFR has been calculated using the CKD EPI equation.     This calculation has not been validated in all clinical situations.     eGFR's persistently <90 mL/min signify possible Chronic Kidney     Disease.   Anion gap 24 (*) 5 - 15   Comment: REPEATED TO VERIFY  PROTIME-INR     Status: Abnormal   Collection Time    07/26/14  9:31 AM      Result Value Ref Range   Prothrombin Time 81.9 (*) 11.6 - 15.2 seconds   INR >10.00 (*) 0.00 - 1.49   Comment: CRITICAL RESULT CALLED TO, READ BACK BY AND VERIFIED WITH:     HAMILTON,J AT [id number] ON [id number] BY HOOKER,B     RESULT REPEATED AND VERIFIED  LIPASE, BLOOD  Status: None   Collection Time    07/26/14  9:31 AM      Result Value Ref Range   Lipase 45  11 - 59 U/L  ETHANOL     Status: Abnormal   Collection Time    07/26/14  9:31 AM      Result Value Ref Range   Alcohol, Ethyl (B) 104 (*) 0 - 11 mg/dL   Comment:            LOWEST DETECTABLE LIMIT FOR     SERUM ALCOHOL IS 11 mg/dL     FOR MEDICAL PURPOSES ONLY   HEMOGLOBIN A1C     Status: None   Collection Time    07/26/14 12:48 PM      Result Value Ref Range   Hemoglobin A1C 5.6  <5.7 %   Comment: (NOTE)                                                                               According to the ADA Clinical Practice Recommendations for 2011, when     HbA1c is used as a screening test:      >=6.5%   Diagnostic of Diabetes Mellitus               (if abnormal result is confirmed)     5.7-6.4%   Increased risk of developing Diabetes Mellitus     References:Diagnosis and Classification of Diabetes Mellitus,Diabetes     ZBMZ,5868,25(RKVTX 1):S62-S69 and Standards of Medical Care in             Diabetes - 2011,Diabetes Care,2011,34 (Suppl 1):S11-S61.   Mean Plasma Glucose 114  <117 mg/dL   Comment: Performed at [facility]  T[facility]     Status: None   Collection Time    07/26/14 12:48 PM      Result Value Ref Range   TSH 1.950  0.350 - 4.500 uIU/mL   Comment: PERFORMED AT [facility]     Performed at [place] MICROSCOPIC     Status: Abnormal   Collection Time    07/26/14  1:38 PM      Result Value Ref Range   Color, Urine AMBER (*) YELLOW   Comment: BIOCHEMICALS MAY BE AFFECTED BY COLOR   APPearance CLOUDY (*) CLEAR   Specific Gravity, Urine 1.015  1.005 - 1.030   pH 5.5  5.0 - 8.0   Glucose, UA NEGATIVE  NEGATIVE mg/dL   Hgb urine dipstick MODERATE (*) NEGATIVE   Bilirubin Urine NEGATIVE  NEGATIVE   Ketones, ur 40 (*) NEGATIVE mg/dL   Protein, ur 30 (*) NEGATIVE mg/dL   Urobilinogen, UA 1.0  0.0 - 1.0 mg/dL   Nitrite NEGATIVE  NEGATIVE   Leukocytes, UA NEGATIVE  NEGATIVE  URINE RAPID DRUG SCREEN (HOSP PERFORMED)     Status: None   Collection Time    07/26/14  1:38 PM      Result Value Ref Range   Opiates NONE DETECTED  NONE DETECTED   Cocaine NONE DETECTED  NONE DETECTED   Benzodiazepines NONE DETECTED  NONE DETECTED   Amphetamines NONE DETECTED  NONE DETECTED  Tetrahydrocannabinol NONE DETECTED  NONE DETECTED   Barbiturates NONE DETECTED  NONE DETECTED   Comment:            DRUG SCREEN FOR MEDICAL PURPOSES     ONLY.  IF CONFIRMATION IS NEEDED     FOR ANY PURPOSE, NOTIFY LAB     WITHIN 5 DAYS.                LOWEST DETECTABLE LIMITS     FOR URINE DRUG SCREEN     Drug Class       Cutoff (ng/mL)     Amphetamine      1000     Barbiturate      200     Benzodiazepine   892     Tricyclics       119     Opiates          300     Cocaine          300     THC              50  URINE MICROSCOPIC-ADD ON     Status: None   Collection Time    07/26/14  1:38 PM      Result Value Ref Range   WBC, UA 0-2  <3 WBC/hpf   RBC / HPF 0-2  <3 RBC/hpf  GLUCOSE, CAPILLARY     Status: Abnormal   Collection Time    07/26/14  4:35 PM      Result Value Ref Range   Glucose-Capillary 146 (*) 70 - 99 mg/dL  GLUCOSE, CAPILLARY     Status: Abnormal   Collection Time    07/26/14  9:21 PM      Result Value Ref Range   Glucose-Capillary 182 (*) 70 - 99 mg/dL  COMPREHENSIVE METABOLIC PANEL     Status: Abnormal   Collection Time    07/27/14  4:11 AM      Result Value Ref Range   Sodium 139  137 - 147 mEq/L   Potassium 4.5  3.7 - 5.3 mEq/L   Comment: DELTA CHECK NOTED     REPEATED TO VERIFY     NO VISIBLE HEMOLYSIS   Chloride 91 (*) 96 - 112 mEq/L   CO2 34 (*) 19 - 32 mEq/L   Glucose, Bld 133 (*) 70 - 99 mg/dL   BUN 13  6 - 23 mg/dL   Creatinine, Ser 0.90  0.50 - 1.35 mg/dL   Calcium 9.3  8.4 - 10.5 mg/dL   Total Protein 7.6  6.0 - 8.3 g/dL   Albumin 4.0  3.5 - 5.2 g/dL   AST 77 (*) 0 - 37 U/L   ALT 55 (*) 0 - 53 U/L   Alkaline Phosphatase 59  39 - 117 U/L   Total Bilirubin 3.0 (*) 0.3 - 1.2 mg/dL   GFR calc non Af Amer 83 (*) >90 mL/min   GFR calc Af Amer >90  >90 mL/min   Comment: (NOTE)     The eGFR has been calculated using the CKD EPI equation.     This calculation has not been validated in all clinical situations.     eGFR's persistently <90 mL/min  signify possible Chronic Kidney     Disease.   Anion gap 14  5 - 15  CBC     Status: Abnormal   Collection Time    07/27/14  4:11 AM      Result Value  Ref Range   WBC 7.0  4.0 - 10.5 K/uL   RBC 4.37  4.22 - 5.81 MIL/uL   Hemoglobin 13.2  13.0 - 17.0 g/dL   HCT 39.1  39.0 - 52.0 %   MCV 89.5  78.0 - 100.0 fL   MCH 30.2  26.0 - 34.0 pg   MCHC 33.8  30.0 - 36.0 g/dL   RDW 14.8  11.5 - 15.5 %   Platelets 96 (*) 150 - 400 K/uL   Comment: REPEATED TO VERIFY     SPECIMEN CHECKED FOR CLOTS     CONSISTENT WITH PREVIOUS RESULT  PROTIME-INR     Status: Abnormal   Collection Time    07/27/14  4:11 AM      Result Value Ref Range   Prothrombin Time 34.7 (*) 11.6 - 15.2 seconds   INR 3.45 (*) 0.00 - 1.49   Dg Chest 2 View  07/26/2014   CLINICAL DATA:  Dyspnea and history of CHF, hypertension and atrial fibrillation. Melena and hematochezia.  EXAM: CHEST - 2 VIEW  COMPARISON:  06/24/2013  FINDINGS: There is stable cardiac enlargement. Lungs show pulmonary venous hypertensive changes without overt edema. No pleural fluid is identified. No airspace consolidation. Stable mild degenerative disease of the thoracic spine.  IMPRESSION: Cardiomegaly and pulmonary venous hypertensive changes without overt edema.   Electronically Signed   By: Aletta Edouard M.D.   On: 07/26/2014 13:47   Review of Systems  Constitutional: Positive for weight loss and malaise/fatigue. Negative for fever and chills.  Eyes: Negative.   Respiratory: Positive for shortness of breath.   Cardiovascular: Positive for leg swelling.  Gastrointestinal: Positive for heartburn, blood in stool and melena. Negative for nausea, vomiting, abdominal pain, diarrhea and constipation.  Musculoskeletal: Positive for joint pain.  Skin: Negative.   Neurological: Positive for weakness.  Endo/Heme/Allergies: Negative.   Psychiatric/Behavioral: Positive for depression and substance abuse. The patient is nervous/anxious.    Blood pressure 161/93,  pulse 68, temperature 98.7 F (37.1 C), temperature source Oral, resp. rate 16, height 5' 11"  (1.803 m), weight 141.976 kg (313 lb), SpO2 97.00%. Physical Exam  Constitutional: He is oriented to person, place, and time. Vital signs are normal. He is cooperative.  morbidly obese  HENT:  Head: Normocephalic and atraumatic.  Eyes: Conjunctivae and EOM are normal. Pupils are equal, round, and reactive to light.  Neck: Neck supple.  Cardiovascular: Normal rate and regular rhythm.   Respiratory: Effort normal. He has decreased breath sounds.  GI: Soft. He exhibits no distension. Bowel sounds are decreased. There is no hepatosplenomegaly. There is no tenderness. There is no rebound and no guarding.  Morbidly obese  Neurological: He is alert and oriented to person, place, and time.  Skin: Skin is warm and dry.  There is 3+ edema of his lower limbs  Psychiatric: He has a normal mood and affect. His behavior is normal. Judgment and thought content normal.   Assessment/Plan: 1) Melena with an episode of hematochezia in a 73 year old alcoholic on Coumadin for atrial fibrillation. Will wait for the INR to normalize over the next couple of days and will plan an EGD to further work up his GI bleeding. Check INR in AM. 2) History of colonic polyps-his last colonoscopy was done by Dr. Olevia Perches in 12/2013.   3) Sigmoid diverticulosis. 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  5) OSA/Morbid obesity. 6) HTN/Hyperlipidemia/Chronic diastolic CHF. 7) Alcohol abuse.  8) Diabetes mellitus.   Edwin [name] 07/27/2014, 8:53 AM

## 2014-07-27 NOTE — Progress Notes (Signed)
*  PRELIMINARY RESULTS* Echocardiogram 2D Echocardiogram has been performed.  Leavy Cella 07/27/2014, 10:11 AM

## 2014-07-27 NOTE — Consult Note (Signed)
CARDIOLOGY CONSULT NOTE  Patient ID: Edwin [name] MRN: [id number] DOB/AGE: 22-Nov-1940 73 y.o.  Admit date: 07/26/2014 Referring Physician  Orson Eva, MD Primary Physician:  Phineas Inches, MD Reason for Consultation  Bradycardia, A. FIb  HPI: patient is a 73 year old morbidly obese Caucasian male with history of hypertension, diabetes mellitus, history of hyperlipidemia and morbid obesity, also has history of chronic atrial fibrillation and on chronic  Coumadin therapy.  Patient had been doing well and has  prior history of alcohol abuse, has an alcohol rehabilitation 5-6 years ago, since then had been sober, but recently was depressed and had been drinking incessantly and also in the past one week except for one sandwich, worse living drinking alcohol.  Yesterday he called the hospital to get help.  While in the workup, he also complained about melanotic stools and also one episode of hematochezia.  While on telemetry yesterday, patient dropped his heart rate to around 36-45 bpm per telemetry, 2.1 second ventricular standstill, hence referred to me.  Patient states that he has started to feel better and there is no history to suggest worsening fatigue, worsening dyspnea, worsening leg edema, syncope, lightheadedness.  Past Medical History  Diagnosis Date  . Hypertension     benign essential hypertension  . Diabetes mellitus     NIDDM  . Morbid obesity   . Pure hypercholesterolemia   . Long-term (current) use of anticoagulants   . Varicose veins of legs   . Pulmonary hypertension MODERATE  . Cor pulmonale, chronic   . Chronic right-sided CHF (congestive heart failure)   . Atrial fibrillation CARDIOLOGIST-- DR Einar Gip  . Anxiety   . Shortness of breath     with exertion   . Arthritis   . Adynamic ileus   . GERD (gastroesophageal reflux disease)   . Cholelithiasis   . OSA (obstructive sleep apnea) 08/29/2013     Past Surgical History  Procedure Laterality Date  . Cardiovascular  stress test  07-19-2012  DR Einar Gip    PROMINENT DIAPHRAGMATIC ATTENUATION/ NORMAL LVEF/ LOW RISK STUDY  . Transthoracic echocardiogram  05-16-2012    LOW NORMAL LVEF/ MOD. RV/ MILD HYPOKINESIS/ MOD. PULMONARY HTN/ CHRONIC COR PUMONALE/ NO SIG. CHANGE FROM 12-01-2010  . Mouth surgery    . Benign mole removal from nose     . Knee arthroscopy with medial menisectomy Left 04/23/2013    Procedure: LEFT KNEE ARTHROSCOPY WITH PARTIAL MEDIAL MENISECTOMY;  Surgeon: Magnus Sinning, MD;  Location: [facility];  Service: Orthopedics;  Laterality: Left;  . Cholecystectomy    . Colon surgery    . Small intestine surgery       Family History  Problem Relation Age of Onset  . Heart disease Mother   . Hypertension Father   . Colon cancer Neg Hx   . Esophageal cancer Neg Hx   . Prostate cancer Neg Hx   . Rectal cancer Neg Hx   . Stomach cancer Neg Hx      Social History: History   Social History  . Marital Status: Married    Spouse Name: N/A    Number of Children: N/A  . Years of Education: N/A   Occupational History  . retired    Social History Main Topics  . Smoking status: Former Smoker -- 1.00 packs/day    Types: Cigarettes    Quit date: 06/28/1987  . Smokeless tobacco: Never Used  . Alcohol Use: Yes     Comment: pint daily  . Drug Use: No  .  Sexual Activity: Not on file   Other Topics Concern  . Not on file   Social History Narrative  . No narrative on file     Prescriptions prior to admission  Medication Sig Dispense Refill  . furosemide (LASIX) 40 MG tablet Take 2 tablets (80 mg total) by mouth 2 (two) times daily.  30 tablet    . losartan (COZAAR) 50 MG tablet Take 100 mg by mouth every morning.       . metFORMIN (GLUCOPHAGE) 500 MG tablet Take 500 mg by mouth daily after supper.       . metoprolol tartrate (LOPRESSOR) 25 MG tablet Take 12.5 mg by mouth 2 (two) times daily.      . potassium chloride SA (K-DUR,KLOR-CON) 20 MEQ tablet Take 1 tablet (20 mEq total) by mouth 2  (two) times daily.  60 tablet  3  . pravastatin (PRAVACHOL) 20 MG tablet Take 1 tablet (20 mg total) by mouth daily after supper.      . warfarin (COUMADIN) 5 MG tablet Take 2.5-5 mg by mouth daily.        Scheduled Meds: . folic acid  1 mg Oral Daily  . furosemide  80 mg Intravenous BID  . insulin aspart  0-9 Units Subcutaneous TID WC  . losartan  100 mg Oral q morning - 10a  . multivitamin with minerals  1 tablet Oral Daily  . pantoprazole  40 mg Oral BID AC  . potassium chloride SA  20 mEq Oral BID  . simvastatin  10 mg Oral q1800  . sodium chloride  3 mL Intravenous Q12H  . thiamine  100 mg Oral Daily   Or  . thiamine  100 mg Intravenous Daily   Continuous Infusions:  PRN Meds:.acetaminophen, acetaminophen, LORazepam, LORazepam, ondansetron (ZOFRAN) IV, ondansetron, oxyCODONE  ROS: Depression present, no suicidal ideation.  Has chronic mild dyspnea, chronic leg edema.  Diabetes mellitus under control until recently.  Has symptoms suggestive of sleep apnea, previously has not been able to tolerate CPAP.  Dark melanotic stools present.  Other systems negative, please see HPI   Physical Exam: Blood pressure 136/75, pulse 69, temperature 97.9 F (36.6 C), temperature source Oral, resp. rate 20, height _0  (1.803 m), weight 141.976 kg (313 lb), SpO2 98.00%.   General appearance: alert, cooperative, appears stated age, no distress and morbidly obese Lungs: clear to auscultation bilaterally and decreased breath sounds at the bases. Heart: S1 variable, S2 is normal.  No gallop or murmur appreciated.  Distant heart sounds. Abdomen: large pannus present, bowel sounds heard in all 4 quadrants, no obvious organomegaly. Extremities: venous stasis dermatitis noted and Chronic Peu-de Orange appearance of the skin with 2-3+ leg edema, partially pitting.  Chronic skin thickening present.  No ulceration. Pulses: Femoral pulses and pedal pulses unable to palpate due to morbid obesity, pedal  pulse in spite of leg edema is 1-2+ bilaterally. Neurologic: Grossly normal  Labs:   Lab Results  Component Value Date   WBC 7.0 07/27/2014   HGB 13.2 07/27/2014   HCT 39.1 07/27/2014   MCV 89.5 07/27/2014   PLT 96* 07/27/2014    Recent Labs Lab 07/27/14 0411  NA 139  K 4.5  CL 91*  CO2 34*  BUN 13  CREATININE 0.90  CALCIUM 9.3  PROT 7.6  BILITOT 3.0*  ALKPHOS 59  ALT 55*  AST 77*  GLUCOSE 133*   Lab Results  Component Value Date   TROPONINI <0.30 06/24/2013  Lipid Panel  No results found for this basename: chol, trig, hdl, cholhdl, vldl, ldlcalc    Echocardiogram 07/27/2014: Left ventricle: The cavity size was normal. Wall thickness was  normal. Systolic function was normal. The estimated ejection  fraction was in the range of 55% to 60%. Although no diagnostic  regional wall motion abnormality was identified, this possibility cannot be completely excluded on the basis of this study. The tudy is not technically sufficient to allow evaluation of LV  diastolic function. Left atrium: The atrium was severely dilated. Severe right atrial enlargement. Right ventricle: The cavity size was dilated. Systolic function was reduced.   EKG: atrial fibrillation with controlled ventricular response, low voltage complexes, poor R-wave progression, PVC.  Telemetry: Frequent PVCs, ventricular couplets.  No significant heart block.   Radiology: Dg Chest 2 View  07/26/2014   CLINICAL DATA:  Dyspnea and history of CHF, hypertension and atrial fibrillation. Melena and hematochezia.  EXAM: CHEST - 2 VIEW  COMPARISON:  06/24/2013  FINDINGS: There is stable cardiac enlargement. Lungs show pulmonary venous hypertensive changes without overt edema. No pleural fluid is identified. No airspace consolidation. Stable mild degenerative disease of the thoracic spine.  IMPRESSION: Cardiomegaly and pulmonary venous hypertensive changes without overt edema.   Electronically Signed   By: Aletta Edouard M.D.   On:  07/26/2014 13:47   ASSESSMENT AND PLAN:  1.  Chronic atrial fibrillation rate controlled.  No significant heart block, in a patient with atrial fibrillation, 2.1 second ventricular standstill is acceptable.  Would recommend changing metoprolol tartrate 12.5 mg by mouth twice a day to metoprolol succinate 25 mg one half tablet by mouth daily. D/W Dr. Hartford Poli. 2.  Warfarin coagulopathy in a patient with alcohol abuse.  Patient on chronic warfarin therapy previously, INR has always been therapeutic over the past many years and in the therapeutic range, though point where we performed INR evaluation on a bimonthly basis due to stable INR.  Hence would recommend continuation of Coumadin, would probably hold off for another week before restarting Coumadin and would recommend close follow-up of CBC.  Unless GI specialist feel Coumadin is a  Contraindication, would recommend Coumadin. 3.  Chronic cor pulmonale with evidence of RV dilatation and RV failure 4.  Morbid obesity, symptoms suggestive of obstructive sleep apnea. 5.  Chronic leg edema, no evidence of DVT or venous insufficiency by prior examination and today's lower extremity venous duplex.  I will be happy to see the patient in the outpatient basis once discharged,, his Coumadin was being managed by his PCP,  He can either follow-up with Dr. Bernerd Limbo or he can follow-up with me.  Met patient and his wife.  Laverda Page, MD 07/27/2014, 6:08 PM [facility]. [place] Pager: [phone] Office: [phone] If no answer Cell [phone]

## 2014-07-27 NOTE — Care Management Note (Addendum)
    Page 1 of 1   07/29/2014     1:57:40 PM CARE MANAGEMENT NOTE 07/29/2014  Patient:  RECE, ZECHMAN   Account Number:  [id number]  Date Initiated:  07/27/2014  Documentation initiated by:  Dessa Phi  Subjective/Objective Assessment:   73 Y/O M ADMITTED W/GIB.     Action/Plan:   FROM HOME.   Anticipated DC Date:  07/29/2014   Anticipated DC Plan:  [place]  CM consult      Choice offered to / List presented to:             Status of service:  Completed, signed off Medicare Important Message given?  YES (If response is "NO", the following Medicare IM given date fields will be blank) Date Medicare IM given:  07/29/2014 Medicare IM given by:  [facility] Date Additional Medicare IM given:   Additional Medicare IM given by:    Discharge Disposition:  HOME/SELF CARE  Per UR Regulation:  Reviewed for med. necessity/level of care/duration of stay  If discussed at [place] of Stay Meetings, dates discussed:    Comments:  07/29/14 Jeaninne Lodico RN,BSN NCM [phone] D/C HOME NO NEEDS OR ORDERS.  07/27/14 Landry Kamath RN,BSN NCM [phone] NO ANTICIPATED D/C NEEDS.

## 2014-07-27 NOTE — Progress Notes (Signed)
*  Preliminary Results* Bilateral lower extremity venous duplex completed. Study was technically difficult due to patient body habitus and poor patient cooperation. Visualized veins of bilateral lower extremities are negative for deep vein thrombosis. There is no evidence of Baker's cyst bilaterally.  07/27/2014 11:57 AM  Maudry Mayhew, RVT, RDCS, RDMS

## 2014-07-27 NOTE — Progress Notes (Signed)
[facility] PROGRESS NOTE   Ishaq Maffei ZOX:[id number] DOB: [birth year] DOA: 07/26/2014 PCP: Phineas Inches, MD  HPI/Subjective: Reported nonbloody bowel movement this morning.  Assessment/Plan: Active Problems:   Atrial fibrillation   GI bleed   Melena   Chronic diastolic CHF (congestive heart failure)   Warfarin-induced coagulopathy   Type II or unspecified type diabetes mellitus without mention of complication, uncontrolled    Melena and hematochezia  -Suspect low-grade GI bleed  -had colonoscopy 01/14/14 with hyperplastic and adenomatous polyps as well as diverticulosis (Dr. Olevia Perches)  -I have consulted GI to eval--spoke with oncall--Dr. Collene Mares  -Trend hemoglobin  -Hemoglobin 13.0 on admission, 13.2 today. -Clear liquid diet   Coagulopathy  -Secondary to alcohol abuse in the setting of warfarin use  -Vitamin K 5 mg IV was given  -INR is 3.45 -Remained hemodynamically stable without active signs of bleeding presently  -Hold Coumadin. -Cardiology please advise if patient is candidate for NOAC.  Atrial fibrillation  -Rate controlled  -Continue metoprolol tartrate   Chronic diastolic CHF  -81/19/1478 echo EF 55-60%  -Appears compensated, but difficult to assess due to the patient's lymphedema and body habitus  -Continue home dose of furosemide  -Repeat echocardiogram   Alcohol abuse  -Alcohol withdrawal protocol  -Education officer, museum to provide resources for rehabilitation   Diabetes mellitus type 2  -Hemoglobin A1c  -NovoLog sliding scale  -Discontinue metformin   Lower extremity edema  -Duplex r/o DVT -Elevate lower extremity, Ace wraps and continue Lasix.  Bradycardia and cardiac pause -Patient has 2.69 pause yesterday, metoprolol discontinued, cardiology to evaluate. -Patient has atrial fibrillation, slow ventricular response secondary to beta blockers.  Morbid obesity and probable OSA/OHS -Patient needs closer milligrams outpatient and likely CPAP at  night.  Code Status: Full code Family Communication: Plan discussed with the patient. Disposition Plan: Remains inpatient   Consultants:  Cardiology.  GI.  Procedures:  None  Antibiotics:  None   Objective: Filed Vitals:   07/27/14 0612  BP: 161/93  Pulse: 68  Temp: 98.7 F (37.1 C)  Resp: 16    Intake/Output Summary (Last 24 hours) at 07/27/14 1135 Last data filed at 07/27/14 1030  Gross per 24 hour  Intake   1803 ml  Output   2400 ml  Net   -597 ml   Filed Weights   07/26/14 0911  Weight: 141.976 kg (313 lb)    Exam: General: Alert and awake, oriented x3, not in any acute distress. HEENT: anicteric sclera, pupils reactive to light and accommodation, EOMI CVS: S1-S2 clear, no murmur rubs or gallops Chest: clear to auscultation bilaterally, no wheezing, rales or rhonchi Abdomen: soft nontender, nondistended, normal bowel sounds, no organomegaly Extremities: no cyanosis, clubbing or edema noted bilaterally Neuro: Cranial nerves II-XII intact, no focal neurological deficits  Data Reviewed: Basic Metabolic Panel:  Recent Labs Lab 07/26/14 0931 07/27/14 0411  NA 141 139  K 3.5* 4.5  CL 93* 91*  CO2 24 34*  GLUCOSE 107* 133*  BUN 16 13  CREATININE 0.93 0.90  CALCIUM 9.0 9.3   Liver Function Tests:  Recent Labs Lab 07/26/14 0931 07/27/14 0411  AST 92* 77*  ALT 58* 55*  ALKPHOS 58 59  BILITOT 2.0* 3.0*  PROT 7.8 7.6  ALBUMIN 4.0 4.0    Recent Labs Lab 07/26/14 0931  LIPASE 45   No results found for this basename: AMMONIA,  in the last 168 hours CBC:  Recent Labs Lab 07/26/14 0931 07/27/14 0411  WBC 5.3 7.0  NEUTROABS  3.4  --   HGB 13.0 13.2  HCT 37.8* 39.1  MCV 87.1 89.5  PLT 110* 96*   Cardiac Enzymes: No results found for this basename: CKTOTAL, CKMB, CKMBINDEX, TROPONINI,  in the last 168 hours BNP (last 3 results) No results found for this basename: PROBNP,  in the last 8760 hours CBG:  Recent Labs Lab  07/26/14 1635 07/26/14 2121  GLUCAP 146* 182*    Micro No results found for this or any previous visit (from the past 240 hour(s)).   Studies: Dg Chest 2 View  07/26/2014   CLINICAL DATA:  Dyspnea and history of CHF, hypertension and atrial fibrillation. Melena and hematochezia.  EXAM: CHEST - 2 VIEW  COMPARISON:  06/24/2013  FINDINGS: There is stable cardiac enlargement. Lungs show pulmonary venous hypertensive changes without overt edema. No pleural fluid is identified. No airspace consolidation. Stable mild degenerative disease of the thoracic spine.  IMPRESSION: Cardiomegaly and pulmonary venous hypertensive changes without overt edema.   Electronically Signed   By: Aletta Edouard M.D.   On: 07/26/2014 13:47    Scheduled Meds: . folic acid  1 mg Oral Daily  . furosemide  80 mg Oral BID  . insulin aspart  0-9 Units Subcutaneous TID WC  . losartan  100 mg Oral q morning - 10a  . multivitamin with minerals  1 tablet Oral Daily  . pantoprazole  40 mg Oral BID AC  . potassium chloride SA  20 mEq Oral BID  . simvastatin  10 mg Oral q1800  . sodium chloride  3 mL Intravenous Q12H  . thiamine  100 mg Oral Daily   Or  . thiamine  100 mg Intravenous Daily   Continuous Infusions:      Time spent: 35 minutes    [facility] A  [facility] Pager [phone] If 7PM-7AM, please contact night-coverage at [email or web address], password [facility] 07/27/2014, 11:35 AM  LOS: 1 day

## 2014-07-28 ENCOUNTER — Encounter (HOSPITAL_COMMUNITY): Payer: MEDICARE | Admitting: Registered Nurse

## 2014-07-28 ENCOUNTER — Encounter (HOSPITAL_COMMUNITY)
Admission: EM | Disposition: A | Discharge status: Home / Self Care | Payer: Self-pay | Source: Home / Self Care | Attending: Internal Medicine

## 2014-07-28 ENCOUNTER — Inpatient Hospital Stay (HOSPITAL_COMMUNITY): Payer: MEDICARE | Admitting: Registered Nurse

## 2014-07-28 ENCOUNTER — Encounter (HOSPITAL_COMMUNITY): Payer: Self-pay | Admitting: *Deleted

## 2014-07-28 DIAGNOSIS — K253 Acute gastric ulcer without hemorrhage or perforation: Secondary | ICD-10-CM

## 2014-07-28 DIAGNOSIS — K254 Chronic or unspecified gastric ulcer with hemorrhage: Secondary | ICD-10-CM

## 2014-07-28 DIAGNOSIS — K259 Gastric ulcer, unspecified as acute or chronic, without hemorrhage or perforation: Secondary | ICD-10-CM

## 2014-07-28 DIAGNOSIS — K2270 Barrett's esophagus without dysplasia: Secondary | ICD-10-CM

## 2014-07-28 HISTORY — PX: ESOPHAGOGASTRODUODENOSCOPY (EGD) WITH PROPOFOL: SHX5813

## 2014-07-28 HISTORY — DX: Gastric ulcer, unspecified as acute or chronic, without hemorrhage or perforation: K25.9

## 2014-07-28 LAB — CBC
HCT: 40 % (ref 39.0–52.0)
HEMOGLOBIN: 13 g/dL (ref 13.0–17.0)
MCH: 29.3 pg (ref 26.0–34.0)
MCHC: 32.5 g/dL (ref 30.0–36.0)
MCV: 90.1 fL (ref 78.0–100.0)
Platelets: 95 10*3/uL — ABNORMAL LOW (ref 150–400)
RBC: 4.44 MIL/uL (ref 4.22–5.81)
RDW: 14.8 % (ref 11.5–15.5)
WBC: 8.2 10*3/uL (ref 4.0–10.5)

## 2014-07-28 LAB — COMPREHENSIVE METABOLIC PANEL
ALBUMIN: 3.8 g/dL (ref 3.5–5.2)
ALK PHOS: 57 U/L (ref 39–117)
ALT: 49 U/L (ref 0–53)
ANION GAP: 12 (ref 5–15)
AST: 59 U/L — ABNORMAL HIGH (ref 0–37)
BUN: 11 mg/dL (ref 6–23)
CO2: 34 mEq/L — ABNORMAL HIGH (ref 19–32)
CREATININE: 0.93 mg/dL (ref 0.50–1.35)
Calcium: 9.1 mg/dL (ref 8.4–10.5)
Chloride: 92 mEq/L — ABNORMAL LOW (ref 96–112)
GFR calc Af Amer: >90 mL/min (ref >90)
GFR calc non Af Amer: 82 mL/min — ABNORMAL LOW (ref >90)
Glucose, Bld: 132 mg/dL — ABNORMAL HIGH (ref 70–99)
POTASSIUM: 3.1 meq/L — AB (ref 3.7–5.3)
Sodium: 138 mEq/L (ref 137–147)
Total Bilirubin: 2.8 mg/dL — ABNORMAL HIGH (ref 0.3–1.2)
Total Protein: 7.3 g/dL (ref 6.0–8.3)

## 2014-07-28 LAB — GLUCOSE, CAPILLARY
Glucose-Capillary: 151 mg/dL — ABNORMAL HIGH (ref 70–99)
Glucose-Capillary: 160 mg/dL — ABNORMAL HIGH (ref 70–99)
Glucose-Capillary: 122 mg/dL — ABNORMAL HIGH (ref 70–99)
Glucose-Capillary: 156 mg/dL — ABNORMAL HIGH (ref 70–99)

## 2014-07-28 LAB — MAGNESIUM: Magnesium: 1.4 mg/dL — ABNORMAL LOW (ref 1.5–2.5)

## 2014-07-28 LAB — POTASSIUM: POTASSIUM: 3.6 meq/L — AB (ref 3.7–5.3)

## 2014-07-28 LAB — PROTIME-INR
INR: 1.97 — ABNORMAL HIGH (ref 0.00–1.49)
PROTHROMBIN TIME: 22.4 s — AB (ref 11.6–15.2)

## 2014-07-28 SURGERY — ESOPHAGOGASTRODUODENOSCOPY (EGD) WITH PROPOFOL
Anesthesia: Monitor Anesthesia Care

## 2014-07-28 MED ORDER — POTASSIUM CHLORIDE 10 MEQ/100ML IV SOLN
10.0000 meq | INTRAVENOUS | Status: AC
  Administered 2014-07-28 (×2): 10 meq via INTRAVENOUS
  Filled 2014-07-28 (×2): qty 100

## 2014-07-28 MED ORDER — POTASSIUM CHLORIDE CRYS ER 20 MEQ PO TBCR
60.0000 meq | EXTENDED_RELEASE_TABLET | Freq: Once | ORAL | Status: AC
  Administered 2014-07-28: 60 meq via ORAL
  Filled 2014-07-28: qty 3

## 2014-07-28 MED ORDER — METOPROLOL SUCCINATE 12.5 MG HALF TABLET
12.5000 mg | ORAL_TABLET | Freq: Every day | ORAL | Status: DC
  Administered 2014-07-28 – 2014-07-29 (×2): 12.5 mg via ORAL
  Filled 2014-07-28 (×2): qty 1

## 2014-07-28 MED ORDER — LIDOCAINE HCL (CARDIAC) 20 MG/ML IV SOLN
INTRAVENOUS | Status: DC | PRN
  Administered 2014-07-28: 100 mg via INTRAVENOUS

## 2014-07-28 MED ORDER — MAGNESIUM SULFATE 40 MG/ML IJ SOLN
2.0000 g | Freq: Three times a day (TID) | INTRAMUSCULAR | Status: AC
  Administered 2014-07-28 (×2): 2 g via INTRAVENOUS
  Filled 2014-07-28 (×2): qty 50

## 2014-07-28 MED ORDER — PROMETHAZINE HCL 25 MG/ML IJ SOLN: 6.2500 mg | INTRAMUSCULAR | Status: DC | PRN

## 2014-07-28 MED ORDER — POTASSIUM CHLORIDE CRYS ER 20 MEQ PO TBCR
40.0000 meq | EXTENDED_RELEASE_TABLET | Freq: Four times a day (QID) | ORAL | Status: DC
  Administered 2014-07-28: 40 meq via ORAL
  Filled 2014-07-28 (×2): qty 2

## 2014-07-28 MED ORDER — PROPOFOL 10 MG/ML IV BOLUS
INTRAVENOUS | Status: AC
  Filled 2014-07-28: qty 20

## 2014-07-28 MED ORDER — LACTATED RINGERS IV SOLN
INTRAVENOUS | Status: DC | PRN
  Administered 2014-07-28: 13:00:00 via INTRAVENOUS

## 2014-07-28 MED ORDER — PROPOFOL INFUSION 10 MG/ML OPTIME
INTRAVENOUS | Status: DC | PRN
  Administered 2014-07-28: 200 ug/kg/min via INTRAVENOUS

## 2014-07-28 MED ORDER — LACTATED RINGERS IV SOLN
INTRAVENOUS | Status: DC
  Administered 2014-07-28: 1000 mL via INTRAVENOUS

## 2014-07-28 MED ORDER — GLYCOPYRROLATE 0.2 MG/ML IJ SOLN
INTRAMUSCULAR | Status: DC | PRN
  Administered 2014-07-28: 0.2 mg via INTRAVENOUS

## 2014-07-28 MED ORDER — BUTAMBEN-TETRACAINE-BENZOCAINE 2-2-14 % EX AERO
INHALATION_SPRAY | CUTANEOUS | Status: DC | PRN
  Administered 2014-07-28: 2 via TOPICAL

## 2014-07-28 SURGICAL SUPPLY — 14 items

## 2014-07-28 NOTE — Consult Note (Signed)
[facility] Progress Note  Subjective:    Feels well. Had several loose BVMs through the night that he says were dark. No abd pain. Admits to drinking 12-14 1.5 oz bottles of alcohol daily for the past 2 weeks. No NSAID use.   Objective:  Vital signs in last 24 hours: Temp:  [97.7 F (36.5 C)-97.9 F (36.6 C)] 97.7 F (36.5 C) (09/08 0446) Pulse Rate:  [69-92] 92 (09/08 0446) Resp:  [20] 20 (09/08 0446) BP: (136-153)/(75-94) 153/86 mmHg (09/08 0446) SpO2:  [95 %-98 %] 95 % (09/08 0446) Weight:  [137.939 kg (304 lb 1.6 oz)] 137.939 kg (304 lb 1.6 oz) (09/08 0446) Last BM Date: 07/27/14 General:   Alert,  Well-developed,  male  in NAD, sclera mildly icteric Heart:  Regular rate and rhythm; no murmurs Pulm lungs clear bilat Abdomen:  Soft, nontender and nondistended. Normal bowel sounds, without guarding, and without rebound.   Extremities:  Without edema. Neurologic:  Alert and  oriented x4;  grossly normal neurologically. Psych:  Alert and cooperative. Normal mood and affect.  Intake/Output from previous day: 09/07 0701 - 09/08 0700 In: 723 [P.O.:720; I.V.:3] Out: -  Intake/Output this shift: Total I/O In: 240 [P.O.:240] Out: -   Lab Results:  Recent Labs  07/26/14 0931 07/27/14 0411 07/28/14 0410  WBC 5.3 7.0 8.2  HGB 13.0 13.2 13.0  HCT 37.8* 39.1 40.0  PLT 110* 96* 95*   BMET  Recent Labs  07/26/14 0931 07/27/14 0411 07/28/14 0410  NA 141 139 138  K 3.5* 4.5 3.1*  CL 93* 91* 92*  CO2 24 34* 34*  GLUCOSE 107* 133* 132*  BUN 16 13 11   CREATININE 0.93 0.90 0.93  CALCIUM 9.0 9.3 9.1   LFT  Recent Labs  07/28/14 0410  PROT 7.3  ALBUMIN 3.8  AST 59*  ALT 49  ALKPHOS 57  BILITOT 2.8*   PT/INR  Recent Labs  07/26/14 0931 07/27/14 0411  LABPROT 81.9* 34.7*  INR >10.00* 3.45*    Dg Chest 2 View  07/26/2014   CLINICAL DATA:  Dyspnea and history of CHF, hypertension and atrial fibrillation. Melena and hematochezia.  EXAM:  CHEST - 2 VIEW  COMPARISON:  06/24/2013  FINDINGS: There is stable cardiac enlargement. Lungs show pulmonary venous hypertensive changes without overt edema. No pleural fluid is identified. No airspace consolidation. Stable mild degenerative disease of the thoracic spine.  IMPRESSION: Cardiomegaly and pulmonary venous hypertensive changes without overt edema.   Electronically Signed   By: Aletta Edouard M.D.   On: 07/26/2014 13:47    Assessment / Plan: 1. 73 yo alcoholic male on coumadin for a-fib, admitted with an episode of melena in setting of supratherapeutic INR.Marland Kitchen His INR was 3.4 yesterday. He has been given  Vit K and repeat INR pending. Doubt this is significant GI bleed  In setting of normal BUN and stable Hgb of 13. Will proceed with EGD this afternoon  2. Hypokalemia.  Potassium 3.1 this morning. He has received 40 meq potassium orally this morning, and hs 10 meq IV x 2 ordered this morning with a repeat K+ to be drawn at noon.  3. Alcohol abuse. He has mild hyperbilirubinemia, transaminases mildly elevated though not in a pattern necessarily related to ETOH. ? Fatty liver .No stigmata of liver disease on exam. Platelets low but can be due to suppression form ETOH.Can work up as outpt, with Dr. Manfred Arch if needed.      LOS: 2 days  Hvozdovic, Vita Barley PA-C 07/28/2014,  GI ATTENDING  History, laboratories reviewed. Patient seen and examined personally. Agree with H&P as outlined above. Plans for upper endoscopy to evaluate low-level GI bleeding in the face of markedly elevated INR. INR now 1.9. Hemoglobin stable. Prior colonoscopy earlier this year as described.  Docia Chuck. Geri Seminole., M.D. [facility] of Gastroenterology

## 2014-07-28 NOTE — Transfer of Care (Signed)
Immediate Anesthesia Transfer of Care Note  Patient: Edwin [name]  Procedure(s) Performed: Procedure(s): ESOPHAGOGASTRODUODENOSCOPY (EGD) WITH PROPOFOL (N/A)  Patient Location: PACU  Anesthesia Type:MAC  Level of Consciousness: awake, alert , oriented and patient cooperative  Airway & Oxygen Therapy: Patient Spontanous Breathing and Patient connected to face mask oxygen  Post-op Assessment: Report given to PACU RN, Post -op Vital signs reviewed and stable and Patient moving all extremities X 4  Post vital signs: stable  Complications: No apparent anesthesia complications

## 2014-07-28 NOTE — Anesthesia Preprocedure Evaluation (Addendum)
Anesthesia Evaluation  Patient identified by MRN, date of birth, ID band Patient awake    Reviewed: Allergy & Precautions, H&P , NPO status , Patient's Chart, lab work & pertinent test results  History of Anesthesia Complications Negative for: history of anesthetic complications  Airway Mallampati: III TM Distance: >3 FB Neck ROM: Full    Dental  (+) Partial Upper,    Pulmonary neg pulmonary ROS, shortness of breath and with exertion, sleep apnea , former smoker,  breath sounds clear to auscultation  Pulmonary exam normal       Cardiovascular Exercise Tolerance: Poor hypertension, Pt. on medications and Pt. on home beta blockers + Peripheral Vascular Disease and +CHF negative cardio ROS  + dysrhythmias Atrial Fibrillation Rhythm:Irregular Rate:Normal     Neuro/Psych Anxiety negative neurological ROS  negative psych ROS   GI/Hepatic negative GI ROS, Neg liver ROS, GERD-  Medicated and Controlled,  Endo/Other  negative endocrine ROSdiabetes, Type 2Morbid obesity  Renal/GU negative Renal ROS  negative genitourinary   Musculoskeletal negative musculoskeletal ROS (+) Arthritis -,   Abdominal   Peds negative pediatric ROS (+)  Hematology negative hematology ROS (+)   Anesthesia Other Findings   Reproductive/Obstetrics negative OB ROS                         Anesthesia Physical Anesthesia Plan  ASA: III  Anesthesia Plan: MAC   Post-op Pain Management:    Induction:   Airway Management Planned: Nasal Cannula  Additional Equipment:   Intra-op Plan:   Post-operative Plan:   Informed Consent: I have reviewed the patients History and Physical, chart, labs and discussed the procedure including the risks, benefits and alternatives for the proposed anesthesia with the patient or authorized representative who has indicated his/her understanding and acceptance.   Dental advisory given  Plan  Discussed with: CRNA  Anesthesia Plan Comments:         Anesthesia Quick Evaluation

## 2014-07-28 NOTE — Op Note (Signed)
[facility] [place], [address]   ENDOSCOPY PROCEDURE REPORT  PATIENT: Edwin [name], Edwin [name]  MR#: [id number] BIRTHDATE: 1941-10-18 , 73  yrs. old GENDER: Male ENDOSCOPIST: Eustace Quail, MD REFERRED BY:  [facility] PROCEDURE DATE:  07/28/2014 PROCEDURE:  EGD w/ biopsy ASA CLASS:     Class III INDICATIONS:  Melena. MEDICATIONS: MAC sedation, administered by CRNA and See Anesthesia Report. TOPICAL ANESTHETIC: Cetacaine Spray DESCRIPTION OF PROCEDURE: After the risks benefits and alternatives of the procedure were thoroughly explained, informed consent was obtained.  The Pentax Gastroscope [id number] endoscope was introduced through the mouth and advanced to the second portion of the duodenum. Without limitations.  The instrument was slowly withdrawn as the mucosa was fully examined.   EXAM:The esophagus revealed distal Barrett's (C5 M5) without active inflammation or other abnormality.  The stomach was remarkable for a 1 cm, somewhat deep, clean-based ulcer without stigmata or active bleeding.  Stomach was otherwise normal.  The duodenum was normal. CLO biopsy taken.  Retroflexed views revealed a hiatal hernia. The scope was then withdrawn from the patient and the procedure completed.  COMPLICATIONS: There were no complications. ENDOSCOPIC IMPRESSION: 1. Clean based gastric ulcer status post CLO biopsy 2. Barrett's esophagus  RECOMMENDATIONS: 1.  Pantoprazole 40 mg twice a day for one month then 40 mg daily indefinitely 2.  Avoid NSAIDS . Okay to use baby aspirin for cardiac purposes if important 3. Would hold Coumadin for 1 week. Okay to resume thereafter if medically important. Decision to be made by patient's cardiologist 3. Treat for H. pylori of CLO test positive 4.  Office visit with Dr. Olevia Perches in 4-6 weeks. Repeat upper Endoscopy in 8 weeks (ulcer healing, Barrett's biopsies) 5. Advance diet. Okay for discharge tomorrow from GI  standpoint. Will sign off  REPEAT EXAM:  eSigned:  Eustace Quail, MD 07/28/2014 2:06 PM   YY:QMGN Olevia Perches, MD, Bernerd Limbo, MD, and Kela Millin, MD

## 2014-07-28 NOTE — Progress Notes (Signed)
[facility] PROGRESS NOTE   Edwin [name] NWG:[id number] DOB: 1941-03-08 DOA: 07/26/2014 PCP: Phineas Inches, MD  HPI/Subjective: Feels better, had bowel movement this morning, INR down to 1.97.  Assessment/Plan: Active Problems:   Atrial fibrillation   GI bleed   Melena   Chronic diastolic CHF (congestive heart failure)   Warfarin-induced coagulopathy   Type II or unspecified type diabetes mellitus without mention of complication, uncontrolled    Melena and hematochezia  -Suspect low-grade GI bleed  -had colonoscopy 01/14/14 with hyperplastic and adenomatous polyps as well as diverticulosis (Dr. Olevia Perches)  -Hemoglobin 13.0 on admission, 13.0 today. -N.p.o. after breakfast for EGD in the afternoon.  Coagulopathy  -Secondary to alcohol abuse in the setting of warfarin use  -Vitamin K 5 mg IV was given  -INR is down to 1.97. -Remained hemodynamically stable without active signs of bleeding presently . -Hold Coumadin. -Discussed with Dr. Einar Gip and he recommended to restart Coumadin when it's okay with GI.  Atrial fibrillation  -Rate controlled.  -Metoprolol switched to Toprol-XL 12.5 mg daily.  Chronic diastolic CHF  -57/84/6962 echo EF 55-60%  -Appears compensated, but difficult to assess due to the patient's lymphedema and body habitus  -Repeat echocardiogram  -Patient mentioned an increase in lower extremity edema without worsening of shortness of breath, Lasix increased.  Alcohol abuse  -Alcohol withdrawal protocol  -Education officer, museum to provide resources for rehabilitation   Diabetes mellitus type 2  -Hemoglobin A1c is 5.6. -NovoLog sliding scale  -Discontinue metformin   Lower extremity edema  -Has chronic lower extremity edema, for some reason Doppler was done and showed no evidence of DVT -Elevate lower extremity, Ace wraps and Lasix dose increased to 80 mg IV twice a day  Bradycardia and cardiac pause -Patient has 2.69 pause yesterday. -Patient has  atrial fibrillation, cardiology recommended to decrease metoprolol dose to Toprol-XL 12.5 mg daily  Morbid obesity and probable OSA/OHS -Please consider polysomnogram as outpatient and likely CPAP at night.  Hypokalemia/hypomagnesemia -Secondary to diuresis, replete orally and parenterally.  Code Status: Full code Family Communication: Plan discussed with the patient. Disposition Plan: Remains inpatient   Consultants:  Cardiology.  GI.  Procedures:  None  Antibiotics:  None   Objective: Filed Vitals:   07/28/14 0446  BP: 153/86  Pulse: 92  Temp: 97.7 F (36.5 C)  Resp: 20    Intake/Output Summary (Last 24 hours) at 07/28/14 1023 Last data filed at 07/28/14 0856  Gross per 24 hour  Intake    963 ml  Output      0 ml  Net    963 ml   Filed Weights   07/26/14 0911 07/28/14 0446  Weight: 141.976 kg (313 lb) 137.939 kg (304 lb 1.6 oz)    Exam: General: Alert and awake, oriented x3, not in any acute distress. HEENT: anicteric sclera, pupils reactive to light and accommodation, EOMI CVS: S1-S2 clear, no murmur rubs or gallops Chest: clear to auscultation bilaterally, no wheezing, rales or rhonchi Abdomen: soft nontender, nondistended, normal bowel sounds, no organomegaly Extremities: no cyanosis, clubbing or edema noted bilaterally Neuro: Cranial nerves II-XII intact, no focal neurological deficits  Data Reviewed: Basic Metabolic Panel:  Recent Labs Lab 07/26/14 0931 07/27/14 0411 07/28/14 0410  NA 141 139 138  K 3.5* 4.5 3.1*  CL 93* 91* 92*  CO2 24 34* 34*  GLUCOSE 107* 133* 132*  BUN 16 13 11   CREATININE 0.93 0.90 0.93  CALCIUM 9.0 9.3 9.1  MG  --   --  1.4*   Liver Function Tests:  Recent Labs Lab 07/26/14 0931 07/27/14 0411 07/28/14 0410  AST 92* 77* 59*  ALT 58* 55* 49  ALKPHOS 58 59 57  BILITOT 2.0* 3.0* 2.8*  PROT 7.8 7.6 7.3  ALBUMIN 4.0 4.0 3.8    Recent Labs Lab 07/26/14 0931  LIPASE 45   No results found for this  basename: AMMONIA,  in the last 168 hours CBC:  Recent Labs Lab 07/26/14 0931 07/27/14 0411 07/28/14 0410  WBC 5.3 7.0 8.2  NEUTROABS 3.4  --   --   HGB 13.0 13.2 13.0  HCT 37.8* 39.1 40.0  MCV 87.1 89.5 90.1  PLT 110* 96* 95*   Cardiac Enzymes: No results found for this basename: CKTOTAL, CKMB, CKMBINDEX, TROPONINI,  in the last 168 hours BNP (last 3 results) No results found for this basename: PROBNP,  in the last 8760 hours CBG:  Recent Labs Lab 07/26/14 2121 07/27/14 0742 07/27/14 1201 07/27/14 1652 07/28/14 0814  GLUCAP 182* 130* 164* 145* 151*    Micro No results found for this or any previous visit (from the past 240 hour(s)).   Studies: Dg Chest 2 View  07/26/2014   CLINICAL DATA:  Dyspnea and history of CHF, hypertension and atrial fibrillation. Melena and hematochezia.  EXAM: CHEST - 2 VIEW  COMPARISON:  06/24/2013  FINDINGS: There is stable cardiac enlargement. Lungs show pulmonary venous hypertensive changes without overt edema. No pleural fluid is identified. No airspace consolidation. Stable mild degenerative disease of the thoracic spine.  IMPRESSION: Cardiomegaly and pulmonary venous hypertensive changes without overt edema.   Electronically Signed   By: Aletta Edouard M.D.   On: 07/26/2014 13:47    Scheduled Meds: . folic acid  1 mg Oral Daily  . furosemide  80 mg Intravenous BID  . insulin aspart  0-9 Units Subcutaneous TID WC  . losartan  100 mg Oral q morning - 10a  . magnesium sulfate 1 - 4 g bolus IVPB  2 g Intravenous Q8H  . multivitamin with minerals  1 tablet Oral Daily  . pantoprazole  40 mg Oral BID AC  . potassium chloride  10 mEq Intravenous Q1 Hr x 2  . simvastatin  10 mg Oral q1800  . sodium chloride  3 mL Intravenous Q12H  . thiamine  100 mg Oral Daily   Or  . thiamine  100 mg Intravenous Daily   Continuous Infusions:      Time spent: 35 minutes    [facility] A  [facility] Pager [phone] If 7PM-7AM, please  contact night-coverage at [email or web address], password [facility] 07/28/2014, 10:23 AM  LOS: 2 days

## 2014-07-28 NOTE — Progress Notes (Signed)
Care of pt assumed at this time.  Pt assessed, reviewed previously charted assessment and agree.  Will continue to monitor.  Debra Calabretta Ann, RN 07/28/2014 

## 2014-07-29 ENCOUNTER — Encounter (HOSPITAL_COMMUNITY): Payer: Self-pay | Admitting: Internal Medicine

## 2014-07-29 LAB — MAGNESIUM: Magnesium: 2.1 mg/dL (ref 1.5–2.5)

## 2014-07-29 LAB — GLUCOSE, CAPILLARY: GLUCOSE-CAPILLARY: 142 mg/dL — AB (ref 70–99)

## 2014-07-29 LAB — CLOTEST (H. PYLORI), BIOPSY: HELICOBACTER SCREEN: NEGATIVE

## 2014-07-29 MED ORDER — ALPRAZOLAM 0.25 MG PO TABS: 0.2500 mg | ORAL_TABLET | Freq: Every evening | ORAL | Status: DC | PRN

## 2014-07-29 MED ORDER — PANTOPRAZOLE SODIUM 40 MG PO TBEC: 40.0000 mg | DELAYED_RELEASE_TABLET | Freq: Every day | ORAL | Status: AC

## 2014-07-29 MED ORDER — METOPROLOL SUCCINATE 12.5 MG HALF TABLET: 12.5000 mg | ORAL_TABLET | Freq: Every day | ORAL | Status: DC

## 2014-07-29 NOTE — Discharge Summary (Addendum)
Physician Discharge Summary  Edwin [name] HWE:[id number] DOB: [birth year] DOA: 07/26/2014  PCP: Phineas Inches, MD  Admit date: 07/26/2014 Discharge date: 07/29/2014  Time spent: 50* minutes  Recommendations for Outpatient Follow-up:  1. *Follow up GI in 2 weeks 2. Follow up PCP in 2 weeks, possible sleep study as outpatient  Discharge Diagnoses:  Active Problems:   Atrial fibrillation   GI bleed   Melena   Chronic diastolic CHF (congestive heart failure)   Warfarin-induced coagulopathy   Type II or unspecified type diabetes mellitus without mention of complication, uncontrolled   Gastric ulcer   Barrett's esophagus   Discharge Condition: Stable  Diet recommendation: Regular diet  Filed Weights   07/28/14 0446 07/28/14 1335 07/29/14 0611  Weight: 137.939 kg (304 lb 1.6 oz) 137.893 kg (304 lb) 137.984 kg (304 lb 3.2 oz)    History of present illness:  73 year old male with a history of hypertension, diabetes mellitus, diastolic CHF, atrial fibrillation, and alcohol abuse presented with hematochezia. The patient states that he has had melanotic stools for 3-4 days prior to admission, but he had an episode of hematochezia on 07/25/2014. The patient actually came to the emergency department to get help for his alcohol dependence, but workup revealed the above history with INR >10. The patient denies fevers, chills, dizziness, syncope, shortness of breath, chest discomfort, vomiting, abdominal pain, diarrhea, dysuria, hematuria. He denies any hematemesis. The patient has been drinking slightly over one pint of vodka daily for the past 2 weeks with his last drink around 1 AM on 07/26/2014. Prior to this past 2 weeks, the patient stated that he drank approximately 9 ounces of vodka daily. He was previously in alcohol rehabilitation 5 years ago. He denies any other illegal use. He feels depressed but denies any suicidal or homicidal ideations. He has had poor oral intake for the past week at home  due to the poor appetite. He states that he has lost 40 pounds in the past 2-3 months. He endorses compliance with all his medications including his warfarin and furosemide.  In the emergency department, the patient was hemodynamically stable. BMP was unremarkable. Liver enzymes were monitored elevated with AST 92, ALT 58, total bilirubin 2.0, alkaline phosphatase 58. Hemoglobin was 13.0 with platelets 110,000. INR>10. The patient was given 5 mg of vitamin K. Lipase was 45. EKG showed atrial fibrillation with heart rate of 62 with nonspecific T-wave changes. Alcohol level was 104   Hospital Course:  Melena and hematochezia -had colonoscopy 01/14/14 with hyperplastic and adenomatous polyps as well as diverticulosis (Dr. Olevia Perches)  -Hemoglobin 13.0 on admission, 13.0 today.  -Underwent EGD which showed clean based gastric ulcer, post CLO biopsy - Barrett's esophagus. - Will start Protonix 40 mg po BID for one month then change to 40 mg po daily indefinitely Follow up GI on 08/19/14  Coagulopathy  -Secondary to alcohol abuse in the setting of warfarin use  -Vitamin K 5 mg IV was given  -INR is down to 1.97.  -Remained hemodynamically stable without active signs of bleeding presently .  -Discussed with Dr. Einar Gip and he recommended to restart Coumadin when it's okay with GI.  - GI recommends to hold the coumadin for one week.  Atrial fibrillation  -Rate controlled.  -Metoprolol switched to Toprol-XL 12.5 mg daily.   Chronic diastolic CHF  -89/38/1017 echo EF 55-60%  -Appears compensated, but difficult to assess due to the patient's lymphedema and body habitus  -Repeat echocardiogram  - Continue with po lasix 40 mg  po BID  Alcohol abuse  -Alcohol withdrawal protocol  -Education officer, museum to provide resources for rehabilitation   Diabetes mellitus type 2  -Hemoglobin A1c is 5.6.  -NovoLog sliding scale  -Discontinue metformin   Lower extremity edema  -Has chronic lower extremity edema, for  some reason Doppler was done and showed no evidence of DVT  -Elevate lower extremity, Ace wraps and Lasix dose increased to 80 mg IV twice a day  - Will discharge on Po lasix 40 mg po BID -  Bradycardia and cardiac pause  -Patient has 2.69 pause yesterday.  -Patient has atrial fibrillation, cardiology recommended to decrease metoprolol dose to Toprol-XL 12.5 mg daily   Morbid obesity and probable OSA/OHS  - polysomnogram as outpatient and likely CPAP at night.   Hypokalemia/hypomagnesemia  -Secondary to diuresis, replete orally and parenterally.   - Anxiety Patient wants medication for anxiety, will give 15 tabs of xanax. Will benefit from SSRI , will discuss with PCP  Procedures:  EGD- Results as  Above  Exam Chest - clear bilaterally Heart - S1s2 RRR Abdomen- Soft, nontender  The results of significant diagnostics from this hospitalization (including imaging, microbiology, ancillary and laboratory) are listed below for reference.    Significant Diagnostic Studies: Dg Chest 2 View  07/26/2014   CLINICAL DATA:  Dyspnea and history of CHF, hypertension and atrial fibrillation. Melena and hematochezia.  EXAM: CHEST - 2 VIEW  COMPARISON:  06/24/2013  FINDINGS: There is stable cardiac enlargement. Lungs show pulmonary venous hypertensive changes without overt edema. No pleural fluid is identified. No airspace consolidation. Stable mild degenerative disease of the thoracic spine.  IMPRESSION: Cardiomegaly and pulmonary venous hypertensive changes without overt edema.   Electronically Signed   By: Aletta Edouard M.D.   On: 07/26/2014 13:47    Microbiology: No results found for this or any previous visit (from the past 240 hour(s)).   Labs: Basic Metabolic Panel:  Recent Labs Lab 07/26/14 0931 07/27/14 0411 07/28/14 0410 07/28/14 1300 07/29/14 0440  NA 141 139 138  --   --   K 3.5* 4.5 3.1* 3.6*  --   CL 93* 91* 92*  --   --   CO2 24 34* 34*  --   --   GLUCOSE 107* 133*  132*  --   --   BUN 16 13 11   --   --   CREATININE 0.93 0.90 0.93  --   --   CALCIUM 9.0 9.3 9.1  --   --   MG  --   --  1.4*  --  2.1   Liver Function Tests:  Recent Labs Lab 07/26/14 0931 07/27/14 0411 07/28/14 0410  AST 92* 77* 59*  ALT 58* 55* 49  ALKPHOS 58 59 57  BILITOT 2.0* 3.0* 2.8*  PROT 7.8 7.6 7.3  ALBUMIN 4.0 4.0 3.8    Recent Labs Lab 07/26/14 0931  LIPASE 45   No results found for this basename: AMMONIA,  in the last 168 hours CBC:  Recent Labs Lab 07/26/14 0931 07/27/14 0411 07/28/14 0410  WBC 5.3 7.0 8.2  NEUTROABS 3.4  --   --   HGB 13.0 13.2 13.0  HCT 37.8* 39.1 40.0  MCV 87.1 89.5 90.1  PLT 110* 96* 95*   Cardiac Enzymes: No results found for this basename: CKTOTAL, CKMB, CKMBINDEX, TROPONINI,  in the last 168 hours BNP: BNP (last 3 results) No results found for this basename: PROBNP,  in the last 8760 hours CBG:  Recent Labs Lab 07/28/14 0814 07/28/14 1125 07/28/14 1733 07/28/14 2154 07/29/14 0743  GLUCAP 151* 160* 122* 156* 142*       Signed:  Daeton Kluth S  [facility] 07/29/2014, 11:14 AM

## 2014-07-30 NOTE — Addendum Note (Signed)
Addendum created 07/30/14 1548 by Donnella Bi, RN   Modules edited: Anesthesia Responsible Staff

## 2014-07-30 NOTE — Anesthesia Postprocedure Evaluation (Signed)
  Anesthesia Post-op Note  Patient: Edwin [name]  Procedure(s) Performed: Procedure(s) (LRB): ESOPHAGOGASTRODUODENOSCOPY (EGD) WITH PROPOFOL (N/A)  Patient Location: PACU  Anesthesia Type: MAC  Level of Consciousness: awake and alert   Airway and Oxygen Therapy: Patient Spontanous Breathing  Post-op Pain: mild  Post-op Assessment: Post-op Vital signs reviewed, Patient's Cardiovascular Status Stable, Respiratory Function Stable, Patent Airway and No signs of Nausea or vomiting  Last Vitals:  Filed Vitals:   07/29/14 0446  BP: 128/84  Pulse: 77  Temp: 36.8 C  Resp: 20    Post-op Vital Signs: stable   Complications: No apparent anesthesia complications

## 2014-08-14 ENCOUNTER — Encounter: Payer: Self-pay | Admitting: *Deleted

## 2014-08-19 ENCOUNTER — Encounter: Payer: Self-pay | Admitting: Nurse Practitioner

## 2014-08-19 ENCOUNTER — Ambulatory Visit (INDEPENDENT_AMBULATORY_CARE_PROVIDER_SITE_OTHER): Payer: MEDICARE | Admitting: Nurse Practitioner

## 2014-08-19 VITALS — BP 118/60 | HR 53 | Ht 71.0 in | Wt 298.0 lb

## 2014-08-19 DIAGNOSIS — K253 Acute gastric ulcer without hemorrhage or perforation: Secondary | ICD-10-CM

## 2014-08-19 DIAGNOSIS — K254 Chronic or unspecified gastric ulcer with hemorrhage: Secondary | ICD-10-CM

## 2014-08-19 NOTE — Patient Instructions (Signed)
Continue Protonix   Please follow up as needed or sooner if symptoms return

## 2014-08-20 ENCOUNTER — Telehealth: Payer: Self-pay | Admitting: *Deleted

## 2014-08-20 ENCOUNTER — Encounter: Payer: Self-pay | Admitting: Nurse Practitioner

## 2014-08-20 NOTE — Progress Notes (Addendum)
Reviewed and agree. I assume he is getting frequent INR's since his bleed was associated with supratherapeutic INR. { Dr Einar Gip?). If not sure, would call him to update CBC,PT in our office.  Addendum: I spoke with cardiologist, Dr. Einar Gip 08/27/14.  Patient's INR is monitored by PCP at [facility]. INR had always been therapeutic until he started drinking again which is when it went to >10. Dr. Einar Gip knew about the GI bleed as he saw him in the hospital.

## 2014-08-20 NOTE — Telephone Encounter (Signed)
Called patient to advise him per Tye Savoy, NP patient will need an EGD scheduled now to make sure we have a spot available for his re screening. Tye Savoy, NP patient needs an appointment with her first week in November to make sure he is healthy enough to have EGD done. Tye Savoy, NP also wants EGD spot for Dr. Olevia Perches on hold because patient will be due at that time for re screening. I called patient to advise him of Roanna Banning, NP request. I also advised that I will need to get his Coumadin clearance. Patient's Cardiologist is Dr. Einar Gip. Patient verbalized understanding.

## 2014-08-20 NOTE — Progress Notes (Signed)
     History of Present Illness:  Patient is a pleasant 73 year old male with multiple medical problems. He is known to Dr. Olevia Perches and had a screening colonoscopy in February of this year with findings of diverticulosis and and adenomatous colon polyp. Patient was recently seen by [place] during hospitalization for upper GI bleed on Coumadin. Inpatient EGD revealed a 1 cm clean-based gastric ulcer. CLOtest was negative. Patient had not been taking NSAIDs. He did not require a transfusion, in fact his hemoglobin remained in the 13 range. Coumadin was restarted one week after hospital discharge. No further melena. As advised,  patient is taking a PPI twice daily for a total of one month then will decrease to once daily. He is actively trying to lose weight and has lost 55 pounds since May  Current Medications, Allergies, Past Medical History, Past Surgical History, Family History and Social History were reviewed in [employer] record.   Physical Exam: General: Pleasant, morbidly obese white male in no acute distress Head: Normocephalic and atraumatic Eyes:  sclerae anicteric, conjunctiva pink  Ears: Normal auditory acuity Lungs: Clear throughout to auscultation Heart: Regular rate and rhythm Abdomen: Soft, morbidly obese, non-tender. No masses, no hepatomegaly. Normal bowel sounds Musculoskeletal: Symmetrical with no gross deformities  Extremities: No edema  Neurological: Alert oriented x 4, grossly nonfocal Psychological:  Alert and cooperative. Normal mood and affect  Assessment and Recommendations:  #1. Recent upper GI bleed secondary to a gastric ulcer. No further bleeding, back on Coumadin.   Patient will need repeat upper endoscopy to document ulcer healing in 6-8 weeks. Will look to get this done mid November, I will see him back for a brief visit beforehand.   Continue PPI as advised  Coumadin will need to be held prior to EGD with biopsies (for Barrett's).  We  will contact his cardiologist about holding the medication.    #2 Possible Barrett's esophagus. Biopsies not done on inpatient EGD. Biopsies can be done at time of repeat EGD in 6-8 weeks.  #3. Morbid obesity, he is actively losing weight. BMI 41.6  #4. Multiple medical problems.  #5. History of alcohol use, overuse at times. Patient has not consumed any alcohol since hospital discharge last month

## 2014-08-20 NOTE — Telephone Encounter (Signed)
08/20/2014   RE: Kani Chauvin DOB: October 29, 1941 MRN: [id number]   Dear Dr. Einar Gip    We have scheduled the above patient for an upper endoscopy. Our records show that he is on anticoagulation therapy.   Please advise as to how long the patient may come off his therapy of Coumadin prior to the procedure, which is scheduled for 10-13-2014  Please fax back/ or route the completed form Evette Georges., CMA at [phone]  Sincerely,    Hope Pigeon

## 2014-08-21 ENCOUNTER — Telehealth: Payer: Self-pay | Admitting: *Deleted

## 2014-08-21 NOTE — Telephone Encounter (Signed)
Laverda Page, MD Carlyle Dolly, CMA            Stop Coumadin 5 days prior to the procedure and start same day after procedure unless biopsy done, then start 1 week later the coumadin at previous home dose.  Thanks

## 2014-08-31 ENCOUNTER — Telehealth: Payer: Self-pay | Admitting: *Deleted

## 2014-08-31 NOTE — Telephone Encounter (Signed)
Message copied by Carlyle Dolly on Mon Aug 31, 2014  8:20 AM ------      Message from: Hope Pigeon A      Created: Thu Aug 20, 2014  5:31 PM       Make follow up with Nevin Bloodgood  ------

## 2014-08-31 NOTE — Telephone Encounter (Signed)
No November schedule out yet

## 2014-09-02 NOTE — Telephone Encounter (Signed)
Called patient back Patient just wanted to make sure he was not forgotten about and he does not have instructions for procedure yet I ensured patient we have not forgotten about him, we are just waiting for Nevin Bloodgood Guenther's scheduled for November to come out and then he will be first one on Nevin Bloodgood wanted to see patient before 10-13-2014 procedure. Paperwork will be done at time of office visit

## 2014-09-02 NOTE — Telephone Encounter (Signed)
Laverda Page, MD Carlyle Dolly, CMA             Stop Coumadin 5 days prior to the procedure and start same day after procedure unless biopsy done, then start 1 week later the coumadin at previous home dose.  Thanks

## 2014-09-08 NOTE — Telephone Encounter (Signed)
Schedule was just put out today for Tye Savoy NP Called patient Patient is scheduled for 09-24-2014 at 130 pm

## 2014-09-24 ENCOUNTER — Ambulatory Visit (INDEPENDENT_AMBULATORY_CARE_PROVIDER_SITE_OTHER): Payer: MEDICARE | Admitting: Nurse Practitioner

## 2014-09-24 ENCOUNTER — Encounter: Payer: Self-pay | Admitting: Nurse Practitioner

## 2014-09-24 VITALS — BP 120/50 | HR 36 | Ht 71.0 in | Wt 296.4 lb

## 2014-09-24 DIAGNOSIS — K253 Acute gastric ulcer without hemorrhage or perforation: Secondary | ICD-10-CM

## 2014-09-24 NOTE — Patient Instructions (Signed)

## 2014-09-24 NOTE — Progress Notes (Signed)
     History of Present Illness:  Patient is a 73 year old male with multiple medical problems known to Dr. Olevia Perches. Patient was hospitalized in September of this year with an upper GI bleed on Coumadin. A 1 cm clean-based gastric ulcer was found, CLO test negative for H. pylori. Patient had not been taking NSAIDs. I saw patient for a hospitall follow-up late September, no further bleeding at that time and patient was back on Coumadin. Plan was for EGD in 4-6 weeks to document ulcer healing. We have already gotten clearance from cardiology to hold Coumadin prior to the procedure. Patient has a history of heavy alcohol intake, he is not consumed any alcohol in the last few months  Current Medications, Allergies, Past Medical History, Past Surgical History, Family History and Social History were reviewed in [employer] record.   Physical Exam: General: Pleasant, obese, white male in no acute distress Head: Normocephalic and atraumatic Eyes:  sclerae anicteric, conjunctiva pink  Ears: Normal auditory acuity Lungs: Clear throughout to auscultation Heart: Irregular rhythm and irregular rate. Apical pulse 42.  Musculoskeletal: Symmetrical with no gross deformities  Extremities: No edema  Neurological: Alert oriented x 4, grossly nonfocal Psychological:  Alert and cooperative. Normal mood and affect  Assessment and Recommendations:   52. 73 year old male with recent upper GI bleed on coumadin (September 2015) and secondary to gastric ulcer . Patient did not require a blood transfusion. Hgb up to 13.0 at last check in early September. Patient needs repeat EGD to document ulcer healing. Cardiology has already approved Coumadin to be held 5 days prior to procedure. The benefits, risks, and potential complications of EGD with possible biopsies were discussed with the patient and he agrees to proceed.   2. Bradycardia. Apical pulse 36-42. Patient is asymptomatic. He is on a very  low dose of Toprol. I am concerned about patient being sedated in setting of bradycardia. I did call patient's cardiologist, Dr. Einar Gip,  to let them know about the bradycardia. Dr. Einar Gip felt patient was fine to proceed with EGD, as long as he was asymptomatic.   3. Possible Barrett's esophagus. Biopsies were not done at time of last EGD because patient was on blood thinners.   4. Multiple significant medical problems including, but not limited to morbid obesity, diabetes, hypertension, chronic congestive heart failure and atrial fibrillation.

## 2014-09-27 NOTE — Progress Notes (Signed)
Reviewed and agree with EGD to follow Gastric ulcer. OFF Coumadin Plan Bx's.

## 2014-10-13 ENCOUNTER — Encounter: Payer: Self-pay | Admitting: Internal Medicine

## 2014-10-13 ENCOUNTER — Ambulatory Visit (AMBULATORY_SURGERY_CENTER): Payer: MEDICARE | Admitting: Internal Medicine

## 2014-10-13 VITALS — BP 123/70 | HR 50 | Temp 96.7°F | Resp 12 | Ht 71.0 in | Wt 296.0 lb

## 2014-10-13 DIAGNOSIS — K253 Acute gastric ulcer without hemorrhage or perforation: Secondary | ICD-10-CM

## 2014-10-13 DIAGNOSIS — K2950 Unspecified chronic gastritis without bleeding: Secondary | ICD-10-CM

## 2014-10-13 LAB — GLUCOSE, CAPILLARY
GLUCOSE-CAPILLARY: 101 mg/dL — AB (ref 70–99)
GLUCOSE-CAPILLARY: 106 mg/dL — AB (ref 70–99)

## 2014-10-13 MED ORDER — SODIUM CHLORIDE 0.9 % IV SOLN: 500.0000 mL | INTRAVENOUS | Status: DC

## 2014-10-13 NOTE — Op Note (Signed)
[facility]  [address] [place], [id number]   ENDOSCOPY PROCEDURE REPORT  PATIENT: Edwin [name], Edwin [name]  MR#: [id number] BIRTHDATE: 07-Nov-1941 , 73  yrs. old GENDER: male ENDOSCOPIST: Lafayette Dragon, MD REFERRED BY:  Bernerd Limbo, M.D. PROCEDURE DATE:  10/13/2014 PROCEDURE:  EGD w/ biopsy ASA CLASS:     Class III INDICATIONS:  gastric ulcer found on endoscopy in September 2015. Patient has been off Coumadin for past 5 days.  Currently on Protonix.  Denies any symptoms. MEDICATIONS: Monitored anesthesia care and Propofol 150 mg IV TOPICAL ANESTHETIC: none  DESCRIPTION OF PROCEDURE: After the risks benefits and alternatives of the procedure were thoroughly explained, informed consent was obtained.  The LB [id number] endoscope was introduced through the mouth and advanced to the second portion of the duodenum , Without limitations.  The instrument was slowly withdrawn as the mucosa was fully examined.    Esophagus: proximal, mid and distal esophageal mucosa was unremarkable. Squamocolumnar junction was normal. There was small hiatal hernia distal to the Z line Stomach: gastric mucosa was mildly erythematous but there was no evidence of gastric ulcer. The previous gastric ulcer has healed completely. Biopsies were taken blindly from the gastric antrum to rule out H. pylori. Retroflexion of the endoscope revealed normal fundus and cardia Duodenum: duodenal bulb and descending duodenum was normal[ The scope was then withdrawn from the patient and the procedure completed.  COMPLICATIONS: There were no immediate complications.  ENDOSCOPIC IMPRESSION:  complete healing off all gastric ulcer. Minimal antral gastritis. Status post biopsies Small reducible hiatal hernia Normal squamocolumnar junction  RECOMMENDATIONS: 1.  Await pathology results 2.  Resume Coumadin Continue Protonix 40 mg daily  REPEAT EXAM: for EGD pending biopsy results.  eSigned:  Lafayette Dragon, MD 10/13/2014 1:51 PM    CC:  PATIENT NAME:  [name], [name] MR#: [id number]

## 2014-10-13 NOTE — Patient Instructions (Addendum)
Impressions/recommendations:  Gastritis (handout given) Hiatal hernia (handout given)  Resume coumadin today. Continue Protonix 40 mg daily.  YOU HAD AN ENDOSCOPIC PROCEDURE TODAY AT [facility]: Refer to the procedure report that was given to you for any specific questions about what was found during the examination.  If the procedure report does not answer your questions, please call your gastroenterologist to clarify.  If you requested that your care partner not be given the details of your procedure findings, then the procedure report has been included in a sealed envelope for you to review at your convenience later.  YOU SHOULD EXPECT: Some feelings of bloating in the abdomen. Passage of more gas than usual.  Walking can help get rid of the air that was put into your GI tract during the procedure and reduce the bloating. If you had a lower endoscopy (such as a colonoscopy or flexible sigmoidoscopy) you may notice spotting of blood in your stool or on the toilet paper. If you underwent a bowel prep for your procedure, then you may not have a normal bowel movement for a few days.  DIET: Your first meal following the procedure should be a light meal and then it is ok to progress to your normal diet.  A half-sandwich or bowl of soup is an example of a good first meal.  Heavy or fried foods are harder to digest and may make you feel nauseous or bloated.  Likewise meals heavy in dairy and vegetables can cause extra gas to form and this can also increase the bloating.  Drink plenty of fluids but you should avoid alcoholic beverages for 24 hours.  ACTIVITY: Your care partner should take you home directly after the procedure.  You should plan to take it easy, moving slowly for the rest of the day.  You can resume normal activity the day after the procedure however you should NOT DRIVE or use heavy machinery for 24 hours (because of the sedation medicines used during the test).     SYMPTOMS TO REPORT IMMEDIATELY: A gastroenterologist can be reached at any hour.  During normal business hours, 8:30 AM to 5:00 PM Monday through Friday, call [phone].  After hours and on weekends, please call the GI answering service at [phone] who will take a message and have the physician on call contact you.   Following upper endoscopy (EGD)  Vomiting of blood or coffee ground material  New chest pain or pain under the shoulder blades  Painful or persistently difficult swallowing  New shortness of breath  Fever of 100F or higher  Black, tarry-looking stools  FOLLOW UP: If any biopsies were taken you will be contacted by phone or by letter within the next 1-3 weeks.  Call your gastroenterologist if you have not heard about the biopsies in 3 weeks.  Our staff will call the home number listed on your records the next business day following your procedure to check on you and address any questions or concerns that you may have at that time regarding the information given to you following your procedure. This is a courtesy call and so if there is no answer at the home number and we have not heard from you through the emergency physician on call, we will assume that you have returned to your regular daily activities without incident.  SIGNATURES/CONFIDENTIALITY: You and/or your care partner have signed paperwork which will be entered into your electronic medical record.  These signatures attest to the fact  that that the information above on your After Visit Summary has been reviewed and is understood.  Full responsibility of the confidentiality of this discharge information lies with you and/or your care-partner.

## 2014-10-13 NOTE — Progress Notes (Signed)
Called to room to assist during endoscopic procedure.  Patient ID and intended procedure confirmed with present staff. Received instructions for my participation in the procedure from the performing physician.  

## 2014-10-14 ENCOUNTER — Telehealth: Payer: Self-pay | Admitting: *Deleted

## 2014-10-14 NOTE — Telephone Encounter (Signed)
  Follow up Call-  Call back number 10/13/2014 01/14/2014  Post procedure Call Back phone  # [phone] hm  Permission to leave phone message Yes Yes     Patient questions:  Do you have a fever, pain , or abdominal swelling? No. Pain Score  0 *  Have you tolerated food without any problems? Yes.    Have you been able to return to your normal activities? Yes.    Do you have any questions about your discharge instructions: Diet   No. Medications  No. Follow up visit  No.  Do you have questions or concerns about your Care? No.  Actions: * If pain score is 4 or above: No action needed, pain <4.

## 2014-10-21 ENCOUNTER — Encounter: Payer: Self-pay | Admitting: Internal Medicine

## 2014-10-22 ENCOUNTER — Encounter: Payer: Self-pay | Admitting: Internal Medicine

## 2015-04-08 ENCOUNTER — Telehealth: Payer: Self-pay

## 2015-04-08 NOTE — Telephone Encounter (Signed)
fyi Dr Lorelei Pont

## 2015-04-08 NOTE — Telephone Encounter (Signed)
Patient is calling to let Dr. Lorelei Pont know that his wife Rasul Decola passed away this past Feb 27, 2023 morning

## 2015-04-10 NOTE — Telephone Encounter (Signed)
Called and spoke with Jeneen Rinks- thanked for the call and shared my condolences on the loss of Hassan Rowan, I will miss her

## 2015-05-17 ENCOUNTER — Other Ambulatory Visit: Payer: Self-pay

## 2015-07-13 ENCOUNTER — Emergency Department (HOSPITAL_COMMUNITY)
Admission: EM | Admit: 2015-07-13 | Discharge: 2015-07-14 | Disposition: A | Discharge status: Home / Self Care | Payer: MEDICARE | Attending: Emergency Medicine | Admitting: Emergency Medicine

## 2015-07-13 ENCOUNTER — Encounter (HOSPITAL_COMMUNITY): Payer: Self-pay

## 2015-07-13 DIAGNOSIS — I509 Heart failure, unspecified: Secondary | ICD-10-CM | POA: Insufficient documentation

## 2015-07-13 DIAGNOSIS — E119 Type 2 diabetes mellitus without complications: Secondary | ICD-10-CM | POA: Diagnosis not present

## 2015-07-13 DIAGNOSIS — Z86010 Personal history of colonic polyps: Secondary | ICD-10-CM | POA: Insufficient documentation

## 2015-07-13 DIAGNOSIS — I10 Essential (primary) hypertension: Secondary | ICD-10-CM | POA: Diagnosis not present

## 2015-07-13 DIAGNOSIS — Z87891 Personal history of nicotine dependence: Secondary | ICD-10-CM | POA: Insufficient documentation

## 2015-07-13 DIAGNOSIS — M1990 Unspecified osteoarthritis, unspecified site: Secondary | ICD-10-CM | POA: Diagnosis not present

## 2015-07-13 DIAGNOSIS — F1010 Alcohol abuse, uncomplicated: Secondary | ICD-10-CM | POA: Insufficient documentation

## 2015-07-13 DIAGNOSIS — Z79899 Other long term (current) drug therapy: Secondary | ICD-10-CM | POA: Insufficient documentation

## 2015-07-13 DIAGNOSIS — Z7901 Long term (current) use of anticoagulants: Secondary | ICD-10-CM | POA: Diagnosis not present

## 2015-07-13 DIAGNOSIS — E780 Pure hypercholesterolemia: Secondary | ICD-10-CM | POA: Insufficient documentation

## 2015-07-13 DIAGNOSIS — K219 Gastro-esophageal reflux disease without esophagitis: Secondary | ICD-10-CM | POA: Diagnosis not present

## 2015-07-13 DIAGNOSIS — I4891 Unspecified atrial fibrillation: Secondary | ICD-10-CM | POA: Diagnosis not present

## 2015-07-13 DIAGNOSIS — F419 Anxiety disorder, unspecified: Secondary | ICD-10-CM | POA: Insufficient documentation

## 2015-07-13 DIAGNOSIS — Z8669 Personal history of other diseases of the nervous system and sense organs: Secondary | ICD-10-CM | POA: Insufficient documentation

## 2015-07-13 DIAGNOSIS — F329 Major depressive disorder, single episode, unspecified: Secondary | ICD-10-CM | POA: Diagnosis present

## 2015-07-13 LAB — COMPREHENSIVE METABOLIC PANEL
ALBUMIN: 4.5 g/dL (ref 3.5–5.0)
ALT: 43 U/L (ref 17–63)
AST: 68 U/L — ABNORMAL HIGH (ref 15–41)
Alkaline Phosphatase: 66 U/L (ref 38–126)
Anion gap: 11 (ref 5–15)
BUN: 9 mg/dL (ref 6–20)
CHLORIDE: 95 mmol/L — AB (ref 101–111)
CO2: 31 mmol/L (ref 22–32)
CREATININE: 0.81 mg/dL (ref 0.61–1.24)
Calcium: 8.7 mg/dL — ABNORMAL LOW (ref 8.9–10.3)
GFR calc Af Amer: >60 mL/min (ref >60)
GFR calc non Af Amer: >60 mL/min (ref >60)
Glucose, Bld: 123 mg/dL — ABNORMAL HIGH (ref 65–99)
POTASSIUM: 3.3 mmol/L — AB (ref 3.5–5.1)
Sodium: 137 mmol/L (ref 135–145)
Total Bilirubin: 1.5 mg/dL — ABNORMAL HIGH (ref 0.3–1.2)
Total Protein: 8.4 g/dL — ABNORMAL HIGH (ref 6.5–8.1)

## 2015-07-13 LAB — CBC WITH DIFFERENTIAL/PLATELET
BASOS ABS: 0 10*3/uL (ref 0.0–0.1)
BASOS PCT: 1 % (ref 0–1)
EOS PCT: 2 % (ref 0–5)
Eosinophils Absolute: 0.2 10*3/uL (ref 0.0–0.7)
HCT: 43.5 % (ref 39.0–52.0)
Hemoglobin: 15 g/dL (ref 13.0–17.0)
LYMPHS PCT: 37 % (ref 12–46)
Lymphs Abs: 2.4 10*3/uL (ref 0.7–4.0)
MCH: 32.8 pg (ref 26.0–34.0)
MCHC: 34.5 g/dL (ref 30.0–36.0)
MCV: 95 fL (ref 78.0–100.0)
Monocytes Absolute: 0.7 10*3/uL (ref 0.1–1.0)
Monocytes Relative: 11 % (ref 3–12)
Neutro Abs: 3.3 10*3/uL (ref 1.7–7.7)
Neutrophils Relative %: 49 % (ref 43–77)
Platelets: 192 10*3/uL (ref 150–400)
RBC: 4.58 MIL/uL (ref 4.22–5.81)
RDW: 14.6 % (ref 11.5–15.5)
WBC: 6.6 10*3/uL (ref 4.0–10.5)

## 2015-07-13 LAB — RAPID URINE DRUG SCREEN, HOSP PERFORMED
Amphetamines: NOT DETECTED
BARBITURATES: NOT DETECTED
BENZODIAZEPINES: NOT DETECTED
Cocaine: NOT DETECTED
Opiates: NOT DETECTED
TETRAHYDROCANNABINOL: NOT DETECTED

## 2015-07-13 LAB — PROTIME-INR
INR: 3.5 — ABNORMAL HIGH (ref 0.00–1.49)
Prothrombin Time: 34.3 seconds — ABNORMAL HIGH (ref 11.6–15.2)

## 2015-07-13 LAB — ETHANOL: Alcohol, Ethyl (B): 250 mg/dL — ABNORMAL HIGH (ref <5)

## 2015-07-13 MED ORDER — LORAZEPAM 1 MG PO TABS
0.0000 mg | ORAL_TABLET | Freq: Four times a day (QID) | ORAL | Status: DC
  Administered 2015-07-13: 3 mg via ORAL
  Administered 2015-07-13: 2 mg via ORAL
  Administered 2015-07-14: 1 mg via ORAL
  Administered 2015-07-14: 2 mg via ORAL
  Administered 2015-07-14: 1 mg via ORAL
  Filled 2015-07-13: qty 1
  Filled 2015-07-13: qty 2
  Filled 2015-07-13: qty 1
  Filled 2015-07-13: qty 2
  Filled 2015-07-13: qty 3

## 2015-07-13 MED ORDER — POTASSIUM CHLORIDE CRYS ER 20 MEQ PO TBCR
20.0000 meq | EXTENDED_RELEASE_TABLET | Freq: Two times a day (BID) | ORAL | Status: DC
  Administered 2015-07-13 – 2015-07-14 (×3): 20 meq via ORAL
  Filled 2015-07-13 (×3): qty 1

## 2015-07-13 MED ORDER — PANTOPRAZOLE SODIUM 40 MG PO TBEC
40.0000 mg | DELAYED_RELEASE_TABLET | Freq: Every day | ORAL | Status: DC
  Administered 2015-07-13 – 2015-07-14 (×2): 40 mg via ORAL
  Filled 2015-07-13 (×2): qty 1

## 2015-07-13 MED ORDER — LOSARTAN POTASSIUM 50 MG PO TABS
100.0000 mg | ORAL_TABLET | Freq: Every morning | ORAL | Status: DC
  Filled 2015-07-13: qty 2

## 2015-07-13 MED ORDER — FUROSEMIDE 40 MG PO TABS
80.0000 mg | ORAL_TABLET | Freq: Two times a day (BID) | ORAL | Status: DC
  Administered 2015-07-13 – 2015-07-14 (×2): 80 mg via ORAL
  Filled 2015-07-13 (×3): qty 2

## 2015-07-13 MED ORDER — WARFARIN SODIUM 2.5 MG PO TABS: 2.5000 mg | ORAL_TABLET | Freq: Every day | ORAL | Status: DC

## 2015-07-13 MED ORDER — LORAZEPAM 1 MG PO TABS: 0.0000 mg | ORAL_TABLET | Freq: Two times a day (BID) | ORAL | Status: DC

## 2015-07-13 MED ORDER — METFORMIN HCL 500 MG PO TABS
500.0000 mg | ORAL_TABLET | Freq: Every day | ORAL | Status: DC
  Administered 2015-07-14: 500 mg via ORAL
  Filled 2015-07-13 (×2): qty 1

## 2015-07-13 MED ORDER — PRAVASTATIN SODIUM 20 MG PO TABS
20.0000 mg | ORAL_TABLET | Freq: Every day | ORAL | Status: DC
  Administered 2015-07-13 – 2015-07-14 (×2): 20 mg via ORAL
  Filled 2015-07-13 (×2): qty 1

## 2015-07-13 MED ORDER — METOPROLOL SUCCINATE 12.5 MG HALF TABLET
12.5000 mg | ORAL_TABLET | Freq: Every day | ORAL | Status: DC
  Filled 2015-07-13: qty 1

## 2015-07-13 MED ORDER — POTASSIUM CHLORIDE CRYS ER 20 MEQ PO TBCR
40.0000 meq | EXTENDED_RELEASE_TABLET | Freq: Once | ORAL | Status: AC
  Administered 2015-07-13: 40 meq via ORAL
  Filled 2015-07-13: qty 2

## 2015-07-13 MED ORDER — LOSARTAN POTASSIUM 50 MG PO TABS
50.0000 mg | ORAL_TABLET | Freq: Every morning | ORAL | Status: DC
  Administered 2015-07-14: 50 mg via ORAL
  Filled 2015-07-13: qty 1

## 2015-07-13 MED ORDER — WARFARIN - PHARMACIST DOSING INPATIENT
Freq: Every day | Status: DC
Start: 2015-07-13 — End: 2015-07-14

## 2015-07-13 NOTE — Progress Notes (Signed)
ANTICOAGULATION CONSULT NOTE - Initial Consult  Pharmacy Consult for Coumadin Indication: atrial fibrillation  Allergies  Allergen Reactions  . Lisinopril Swelling    Tongue swelling    Patient Measurements: Height: 5\' 11"  (180.3 cm) Weight: 265 lb (120.203 kg) IBW/kg (Calculated) : 75.3  Vital Signs: Temp: 98 F (36.7 C) (08/23 1511) Temp Source: Oral (08/23 1138) BP: 144/77 mmHg (08/23 1513) Pulse Rate: 68 (08/23 1513)  Labs:  Recent Labs  07/13/15 1302  HGB 15.0  HCT 43.5  PLT 192  LABPROT 34.3*  INR 3.50*  CREATININE 0.81    Estimated Creatinine Clearance: 107.2 mL/min (by C-G formula based on Cr of 0.81).   Medical History: Past Medical History  Diagnosis Date  . Hypertension     benign essential hypertension  . Diabetes mellitus     NIDDM  . Morbid obesity   . Pure hypercholesterolemia   . Long-term (current) use of anticoagulants   . Varicose veins of legs   . Pulmonary hypertension MODERATE  . Cor pulmonale, chronic   . Chronic right-sided CHF (congestive heart failure)   . Atrial fibrillation CARDIOLOGIST-- DR Einar Gip  . Anxiety   . Shortness of breath     with exertion   . Arthritis   . Adynamic ileus   . GERD (gastroesophageal reflux disease)   . Cholelithiasis   . OSA (obstructive sleep apnea) 08/29/2013  . Barrett esophagus   . Colon polyps     TUBULAR ADENOMA (2) & HYPERPLASTIC POLYP  . GI bleed   . Sleep apnea     no CPAP    Medications:   (Not in a hospital admission)  Assessment: 74yo presenting for treatment of depression and alcohol abuse. Pharmacy is asked to dose Coumadin he takes for A.fib. He reports his home dose as 2.5mg  daily.   INR is 3.5, supratherapuetic on admission.  CBC is wnl and no bleeding is reported/documented.  Goal of Therapy:  INR 2-3 Monitor platelets by anticoagulation protocol: Yes   Plan:  No Coumadin tonight.  Check PT/INR daily.  Romeo Rabon, PharmD, pager [phone]. 07/13/2015,3:53  PM.

## 2015-07-13 NOTE — ED Provider Notes (Addendum)
CSN: [id number]     Arrival date & time 07/13/15  1132 History   First MD Initiated Contact with Patient 07/13/15 1229     Chief Complaint  Patient presents with  . Addiction Problem  . Depression     (Consider location/radiation/quality/duration/timing/severity/associated sxs/prior Treatment) HPI Comments: Pt with increased alcohol abuse and depression since his wife died Denies hi/si Last drink was today, denies withdrawal sx No command hallucinations Nothing makes sx better, no tx used pta  Patient is a 74 y.o. male presenting with depression. The history is provided by the patient.  Depression This is a new problem. The current episode started more than 1 week ago. The problem occurs constantly. The problem has been gradually worsening. Nothing aggravates the symptoms. Nothing relieves the symptoms.    Past Medical History  Diagnosis Date  . Hypertension     benign essential hypertension  . Diabetes mellitus     NIDDM  . Morbid obesity   . Pure hypercholesterolemia   . Long-term (current) use of anticoagulants   . Varicose veins of legs   . Pulmonary hypertension MODERATE  . Cor pulmonale, chronic   . Chronic right-sided CHF (congestive heart failure)   . Atrial fibrillation CARDIOLOGIST-- DR Einar Gip  . Anxiety   . Shortness of breath     with exertion   . Arthritis   . Adynamic ileus   . GERD (gastroesophageal reflux disease)   . Cholelithiasis   . OSA (obstructive sleep apnea) 08/29/2013  . Barrett esophagus   . Colon polyps     TUBULAR ADENOMA (2) & HYPERPLASTIC POLYP  . GI bleed   . Sleep apnea     no CPAP   Past Surgical History  Procedure Laterality Date  . Cardiovascular stress test  07-19-2012  DR Einar Gip    PROMINENT DIAPHRAGMATIC ATTENUATION/ NORMAL LVEF/ LOW RISK STUDY  . Transthoracic echocardiogram  05-16-2012    LOW NORMAL LVEF/ MOD. RV/ MILD HYPOKINESIS/ MOD. PULMONARY HTN/ CHRONIC COR PUMONALE/ NO SIG. CHANGE FROM 12-01-2010  . Mouth surgery     . Benign mole removal from nose     . Knee arthroscopy with medial menisectomy Left 04/23/2013    Procedure: LEFT KNEE ARTHROSCOPY WITH PARTIAL MEDIAL MENISECTOMY;  Surgeon: Magnus Sinning, MD;  Location: [facility];  Service: Orthopedics;  Laterality: Left;  . Esophagogastroduodenoscopy (egd) with propofol N/A 07/28/2014    Procedure: ESOPHAGOGASTRODUODENOSCOPY (EGD) WITH PROPOFOL;  Surgeon: Irene Shipper, MD;  Location: [facility];  Service: Endoscopy;  Laterality: N/A;  . Colonoscopy     Family History  Problem Relation Age of Onset  . Heart disease Mother   . Hypertension Father   . Colon cancer Neg Hx   . Throat cancer Paternal Uncle   . Prostate cancer Neg Hx   . Rectal cancer Neg Hx   . Stomach cancer Neg Hx    Social History  Substance Use Topics  . Smoking status: Former Smoker -- 1.00 packs/day    Types: Cigarettes    Quit date: 06/28/1987  . Smokeless tobacco: Never Used  . Alcohol Use: 0.0 oz/week    0 Standard drinks or equivalent per week     Comment: pint daily    Review of Systems  Psychiatric/Behavioral: Positive for depression.  All other systems reviewed and are negative.     Allergies  Lisinopril  Home Medications   Prior to Admission medications   Medication Sig Start Date End Date Taking? Authorizing Provider  ALPRAZolam Duanne Moron) 0.25  MG tablet Take 1 tablet (0.25 mg total) by mouth at bedtime as needed for anxiety. Patient taking differently: Take 0.5 mg by mouth at bedtime as needed for anxiety.  07/29/14   Oswald Hillock, MD  furosemide (LASIX) 40 MG tablet Take 2 tablets (80 mg total) by mouth 2 (two) times daily. 07/10/13   Charlynne Cousins, MD  losartan (COZAAR) 50 MG tablet Take 100 mg by mouth every morning.     Historical Provider, MD  metFORMIN (GLUCOPHAGE) 500 MG tablet Take 500 mg by mouth daily with breakfast.    Historical Provider, MD  metoprolol succinate (TOPROL-XL) 12.5 mg TB24 24 hr tablet Take 0.5 tablets (12.5 mg total) by mouth  daily. 07/29/14   Oswald Hillock, MD  pantoprazole (PROTONIX) 40 MG tablet Take 1 tablet (40 mg total) by mouth daily. 07/29/14   Oswald Hillock, MD  potassium chloride SA (K-DUR,KLOR-CON) 20 MEQ tablet Take 1 tablet (20 mEq total) by mouth 2 (two) times daily. 07/30/13   Amy D Clegg, NP  pravastatin (PRAVACHOL) 20 MG tablet Take 1 tablet (20 mg total) by mouth daily after supper. 07/10/13   Charlynne Cousins, MD  warfarin (COUMADIN) 5 MG tablet Take 2.5-5 mg by mouth daily.    Historical Provider, MD   BP 146/76 mmHg  Pulse 77  Temp(Src) 97.7 F (36.5 C) (Oral)  Resp 16  Ht 5\' 11"  (1.803 m)  Wt 265 lb (120.203 kg)  BMI 36.98 kg/m2  SpO2 99% Physical Exam  Constitutional: He is oriented to person, place, and time. He appears well-developed and well-nourished.  Non-toxic appearance. No distress.  HENT:  Head: Normocephalic and atraumatic.  Eyes: Conjunctivae, EOM and lids are normal. Pupils are equal, round, and reactive to light.  Neck: Normal range of motion. Neck supple. No tracheal deviation present. No thyroid mass present.  Cardiovascular: Normal rate, regular rhythm and normal heart sounds.  Exam reveals no gallop.   No murmur heard. Pulmonary/Chest: Effort normal and breath sounds normal. No stridor. No respiratory distress. He has no decreased breath sounds. He has no wheezes. He has no rhonchi. He has no rales.  Abdominal: Soft. Normal appearance and bowel sounds are normal. He exhibits no distension. There is no tenderness. There is no rebound and no CVA tenderness.  Musculoskeletal: Normal range of motion. He exhibits no edema or tenderness.  Neurological: He is alert and oriented to person, place, and time. He has normal strength. No cranial nerve deficit or sensory deficit. GCS eye subscore is 4. GCS verbal subscore is 5. GCS motor subscore is 6.  Skin: Skin is warm and dry. No abrasion and no rash noted.  Psychiatric: His speech is normal and behavior is normal. His affect is blunt.  He expresses no suicidal plans and no homicidal plans.  Nursing note and vitals reviewed.   ED Course  Procedures (including critical care time) Labs Review Labs Reviewed  ETHANOL  URINE RAPID DRUG SCREEN, HOSP PERFORMED  CBC WITH DIFFERENTIAL/PLATELET  COMPREHENSIVE METABOLIC PANEL  PROTIME-INR    Imaging Review No results found. I have personally reviewed and evaluated these images and lab results as part of my medical decision-making.   EKG Interpretation None      MDM   Final diagnoses:  None    Potassium oral given, inr noted, will hold dose one day, patient medically cleared   Lacretia Leigh, MD 07/13/15 1512  Lacretia Leigh, MD 08/30/15 1301

## 2015-07-13 NOTE — BH Assessment (Signed)
Seeking inpt placement. Sent referrals to: [facility], [facility], [place], [place], [place] Triage Specialist 07/13/2015 10:23 PM

## 2015-07-13 NOTE — BH Assessment (Addendum)
Tele Assessment Note   Edwin [name] is an 74 y.o. male. Pt presents voluntarily to [facility]. He reports he has been drinking approx one fifth total of wine and liquor daily since his wife's sudden death from cancer in May 07, 2015. Pt reports he drank a few margaritas this am prior to admission. He is pleasant and oriented x 4. Pt endorses depressive sxs including insomnia, isolating bx, fatigue, guilt and loss of interest in usual pleasures. He reports memory impairment and decreased concentration. He reports poor appetite. He says that he has lost approx 100 lbs in one year. Pt says some of the weight loss was intentionally, but he says he has lost weight from drinking rather than eating. Pt denies SI and HI. He denies [facility] and no delusions. Pt denies hx of seizures when stopped drinking. Pt reports he has been to [facility] x 3 (1984, 1994, 2001) and [facility] in 2007 for alcohol dependence. He reports he is worried about his finances as he no longer has his wife's pension. Pt reports longest amount of continuous sober time was 8 yrs from 20 to 57. Writer ran pt by Charmaine Downs NP who recommends inpatient treatment.   Axis I: Major Depression, single episode            Alcohol Use Disorder, Severe Axis II: Deferred Axis III:  Past Medical History  Diagnosis Date  . Hypertension     benign essential hypertension  . Diabetes mellitus     NIDDM  . Morbid obesity   . Pure hypercholesterolemia   . Long-term (current) use of anticoagulants   . Varicose veins of legs   . Pulmonary hypertension MODERATE  . Cor pulmonale, chronic   . Chronic right-sided CHF (congestive heart failure)   . Atrial fibrillation CARDIOLOGIST-- DR Einar Gip  . Anxiety   . Shortness of breath     with exertion   . Arthritis   . Adynamic ileus   . GERD (gastroesophageal reflux disease)   . Cholelithiasis   . OSA (obstructive sleep apnea) 08/29/2013  . Barrett esophagus   . Colon polyps     TUBULAR ADENOMA (2) &  HYPERPLASTIC POLYP  . GI bleed   . Sleep apnea     no CPAP   Axis IV: economic problems, other psychosocial or environmental problems, problems related to social environment and problems with primary support group Axis V: 41-50 serious symptoms  Past Medical History:  Past Medical History  Diagnosis Date  . Hypertension     benign essential hypertension  . Diabetes mellitus     NIDDM  . Morbid obesity   . Pure hypercholesterolemia   . Long-term (current) use of anticoagulants   . Varicose veins of legs   . Pulmonary hypertension MODERATE  . Cor pulmonale, chronic   . Chronic right-sided CHF (congestive heart failure)   . Atrial fibrillation CARDIOLOGIST-- DR Einar Gip  . Anxiety   . Shortness of breath     with exertion   . Arthritis   . Adynamic ileus   . GERD (gastroesophageal reflux disease)   . Cholelithiasis   . OSA (obstructive sleep apnea) 08/29/2013  . Barrett esophagus   . Colon polyps     TUBULAR ADENOMA (2) & HYPERPLASTIC POLYP  . GI bleed   . Sleep apnea     no CPAP    Past Surgical History  Procedure Laterality Date  . Cardiovascular stress test  07-19-2012  DR Einar Gip    PROMINENT DIAPHRAGMATIC ATTENUATION/ NORMAL  LVEF/ LOW RISK STUDY  . Transthoracic echocardiogram  05-16-2012    LOW NORMAL LVEF/ MOD. RV/ MILD HYPOKINESIS/ MOD. PULMONARY HTN/ CHRONIC COR PUMONALE/ NO SIG. CHANGE FROM 12-01-2010  . Mouth surgery    . Benign mole removal from nose     . Knee arthroscopy with medial menisectomy Left 04/23/2013    Procedure: LEFT KNEE ARTHROSCOPY WITH PARTIAL MEDIAL MENISECTOMY;  Surgeon: Magnus Sinning, MD;  Location: [facility];  Service: Orthopedics;  Laterality: Left;  . Esophagogastroduodenoscopy (egd) with propofol N/A 07/28/2014    Procedure: ESOPHAGOGASTRODUODENOSCOPY (EGD) WITH PROPOFOL;  Surgeon: Irene Shipper, MD;  Location: [facility];  Service: Endoscopy;  Laterality: N/A;  . Colonoscopy      Family History:  Family History  Problem Relation Age  of Onset  . Heart disease Mother   . Hypertension Father   . Colon cancer Neg Hx   . Throat cancer Paternal Uncle   . Prostate cancer Neg Hx   . Rectal cancer Neg Hx   . Stomach cancer Neg Hx     Social History:  reports that he quit smoking about 28 years ago. His smoking use included Cigarettes. He smoked 1.00 pack per day. He has never used smokeless tobacco. He reports that he drinks alcohol. He reports that he does not use illicit drugs.  Additional Social History:  Alcohol / Drug Use Pain Medications: pt denies hx violence - is calm and cooperative Prescriptions: pt denies hx violence - is calm and cooperative Over the Counter: pt denies hx viollence - is calm and cooperative History of alcohol / drug use?: Yes Longest period of sobriety (when/how long): 8 yrs (1984 to 1991) Negative Consequences of Use: Personal relationships Withdrawal Symptoms:  (no withdrawal sxs currently) Substance #1 Name of Substance 1: alcohol 1 - Age of First Use: 18 1 - Amount (size/oz): approx one fifth total of wine and liquor 1 - Frequency: daily 1 - Duration: since wife died in 04/27/2015 1 - Last Use / Amount: 07/13/15 - margaritas  CIWA: CIWA-Ar BP: 146/76 mmHg Pulse Rate: 77 COWS:    PATIENT STRENGTHS: (choose at least two) Ability for insight Average or above average intelligence Capable of independent living Communication skills  Allergies:  Allergies  Allergen Reactions  . Lisinopril Swelling    Tongue swelling    Home Medications:  (Not in a hospital admission)  OB/GYN Status:  No LMP for male patient.  General Assessment Data [facility] of Assessment: [facility] T[facility] Assessment: In system Is this a Tele or Face-to-Face Assessment?: Face-to-Face Is this an Initial Assessment or a Re-assessment for this encounter?: Initial Assessment Marital status: Widowed Living Arrangements: Alone Can pt return to current living arrangement?: Yes Admission Status: Voluntary Is patient  capable of signing voluntary admission?: Yes Referral Source: Self/Family/Friend Insurance type: medicare railroad     [place] Living Arrangements: Alone Name of Psychiatrist: none Name of Therapist: none  Education Status Is patient currently in school?: No Highest grade of school patient has completed: 16 Name of school: [employer] [address]  Risk to self with the past 6 months Suicidal Ideation: No Has patient been a risk to self within the past 6 months prior to admission? : No Suicidal Intent: No Has patient had any suicidal intent within the past 6 months prior to admission? : No Is patient at risk for suicide?: No Suicidal Plan?: No Has patient had any suicidal plan within the past 6 months prior to admission? :  No Access to Means: No What has been your use of drugs/alcohol within the last 12 months?: pt reports daily drinking Previous Attempts/Gestures: No How many times?: 0 Other Self Harm Risks: none Triggers for Past Attempts:  (n/a) Intentional Self Injurious Behavior: None Family Suicide History: No (pt denies family hx of MI or SA or suicide) Recent stressful life event(s): Loss (Comment) (wife died May 15, 2015 and he started drinking heavily) Persecutory voices/beliefs?: No Depression: Yes Depression Symptoms: Insomnia, Isolating, Fatigue, Guilt, Loss of interest in usual pleasures Substance abuse history and/or treatment for substance abuse?: Yes Suicide prevention information given to non-admitted patients: Not applicable  Risk to Others within the past 6 months Homicidal Ideation: No Does patient have any lifetime risk of violence toward others beyond the six months prior to admission? : No Thoughts of Harm to Others: No Current Homicidal Intent: No Current Homicidal Plan: No Access to Homicidal Means: No Identified Victim: none History of harm to others?: No Assessment of Violence: None Noted Violent Behavior Description: pt denies hx  violence Does patient have access to weapons?: Yes (Comment) (pt reports he owns gun) Criminal Charges Pending?: No Does patient have a court date: No Is patient on probation?: No  Psychosis Hallucinations: None noted Delusions: None noted  Mental Status Report Appearance/Hygiene: In scrubs, Unremarkable Eye Contact: Fair Motor Activity: Freedom of movement Speech: Logical/coherent Level of Consciousness: Alert Mood: Depressed, Sad, Anhedonia Affect: Appropriate to circumstance, Depressed, Sad Anxiety Level: Moderate Thought Processes: Relevant, Coherent Judgement: Unimpaired Orientation: Person, Time, Situation, Place Obsessive Compulsive Thoughts/Behaviors: None  Cognitive Functioning Concentration: Normal Memory: Recent Impaired, Remote Impaired IQ: Average Insight: Good Impulse Control: Fair Appetite: Poor Weight Loss:  (pt sts lost 100 lbs in a year but that was on purpose; ) Sleep: Decreased Total Hours of Sleep: 6 Vegetative Symptoms: None  ADLScreening [facility]) Patient's cognitive ability adequate to safely complete daily activities?: Yes Patient able to express need for assistance with ADLs?: Yes Independently performs ADLs?: Yes (appropriate for developmental age)  Prior Inpatient Therapy Prior Inpatient Therapy: Yes Prior Therapy Dates: 1984, 1994, 2001, 2007 Prior Therapy Facilty/Provider(s): [facility], [facility] Reason for Treatment: alcohol dependence  Prior Outpatient Therapy Prior Outpatient Therapy: No Prior Therapy Dates: na Prior Therapy Facilty/Provider(s): na Reason for Treatment: na Does patient have an ACCT team?: No Does patient have Intensive In-House Services?  : No Does patient have [facility] services? : No Does patient have P4CC services?: No  ADL Screening (condition at time of admission) Patient's cognitive ability adequate to safely complete daily activities?: Yes Is the patient deaf or have difficulty  hearing?: No Does the patient have difficulty seeing, even when wearing glasses/contacts?: No Does the patient have difficulty concentrating, remembering, or making decisions?: Yes Patient able to express need for assistance with ADLs?: Yes Does the patient have difficulty dressing or bathing?: No Independently performs ADLs?: Yes (appropriate for developmental age) Does the patient have difficulty walking or climbing stairs?: No Weakness of Legs: None Weakness of Arms/Hands: None  Home Assistive Devices/Equipment Home Assistive Devices/Equipment:  (reading glasses)    Abuse/Neglect Assessment (Assessment to be complete while patient is alone) Physical Abuse: Denies Verbal Abuse: Denies Sexual Abuse: Denies Exploitation of patient/patient's resources: Denies Self-Neglect: Denies     Regulatory affairs officer (For Healthcare) Does patient have an advance directive?: No Would patient like information on creating an advanced directive?: No - patient declined information    Additional Information 1:1 In Past 12 Months?: No CIRT Risk:  No Elopement Risk: No Does patient have medical clearance?: Yes     Disposition:  Disposition Initial Assessment Completed for this Encounter: Yes Disposition of Patient: Inpatient treatment program Type of inpatient treatment program: Adult (josephine onuoha np recommends inpatient treatment)  Yuriko Portales P 07/13/2015 1:49 PM

## 2015-07-13 NOTE — ED Notes (Signed)
Patient states he lost his wife May 2016. Patient states he has been drinking alcohol and feeling depressed since the death of his wife. Patient denies have SI/HI. Patient denies auditory or visual hallucinations.Patient states lately he feels things crawling on him. Patient states he drank Vodka/Margaritas this AM. Patient states he drinks daily and usually drinks Vodka.

## 2015-07-14 DIAGNOSIS — F1010 Alcohol abuse, uncomplicated: Secondary | ICD-10-CM | POA: Diagnosis not present

## 2015-07-14 LAB — PROTIME-INR
INR: 3.68 — AB (ref 0.00–1.49)
PROTHROMBIN TIME: 35.7 s — AB (ref 11.6–15.2)

## 2015-07-14 NOTE — ED Notes (Signed)
Notified Sheriff's office for transportation spoke to [place] for transportation to [facility] will call back with ETA.

## 2015-07-14 NOTE — ED Notes (Signed)
Report called to [facility] spoke to [facility] RN.

## 2015-07-14 NOTE — Progress Notes (Signed)
ANTICOAGULATION CONSULT NOTE - Initial Consult  Pharmacy Consult for Coumadin Indication: atrial fibrillation  Allergies  Allergen Reactions  . Lisinopril Swelling    Tongue swelling    Patient Measurements: Height: 5\' 11"  (180.3 cm) Weight: 265 lb (120.203 kg) IBW/kg (Calculated) : 75.3  Vital Signs: Temp: 98.7 F (37.1 C) (08/24 0511) Temp Source: Oral (08/24 0511) BP: 109/92 mmHg (08/24 0511) Pulse Rate: 73 (08/24 0511)  Labs:  Recent Labs  07/13/15 1302 07/14/15 0455  HGB 15.0  --   HCT 43.5  --   PLT 192  --   LABPROT 34.3* 35.7*  INR 3.50* 3.68*  CREATININE 0.81  --     Estimated Creatinine Clearance: 107.2 mL/min (by C-G formula based on Cr of 0.81).   Medical History: Past Medical History  Diagnosis Date  . Hypertension     benign essential hypertension  . Diabetes mellitus     NIDDM  . Morbid obesity   . Pure hypercholesterolemia   . Long-term (current) use of anticoagulants   . Varicose veins of legs   . Pulmonary hypertension MODERATE  . Cor pulmonale, chronic   . Chronic right-sided CHF (congestive heart failure)   . Atrial fibrillation CARDIOLOGIST-- DR Einar Gip  . Anxiety   . Shortness of breath     with exertion   . Arthritis   . Adynamic ileus   . GERD (gastroesophageal reflux disease)   . Cholelithiasis   . OSA (obstructive sleep apnea) 08/29/2013  . Barrett esophagus   . Colon polyps     TUBULAR ADENOMA (2) & HYPERPLASTIC POLYP  . GI bleed   . Sleep apnea     no CPAP     Assessment: 74yo with PMH of upper GI bleed 2/2 gastric ulcer presenting for treatment of depression and alcohol abuse. Pharmacy is asked to dose warfarin which he takes at home for a-fib. He reports his home dose as 2.5mg  daily.   Today, 07/14/2015:  INR is 3.68, remains supratherapuetic  CBC is WNL and no bleeding is reported/documented.  No drug interactions present  Goal of Therapy:  INR 2-3 Monitor platelets by anticoagulation protocol: Yes    Plan:   No warfarin tonight.   Check PT/INR daily.  Monitor closely for s/s of bleeding.   Lindell Spar, PharmD, BCPS Pager: [phone] 07/14/2015 10:13 AM

## 2015-07-14 NOTE — ED Notes (Signed)
Patient discharged to [place] ([facility] [place]).

## 2015-09-12 ENCOUNTER — Emergency Department (HOSPITAL_COMMUNITY): Payer: MEDICARE

## 2015-09-12 ENCOUNTER — Inpatient Hospital Stay (HOSPITAL_COMMUNITY)
Admission: EM | Admit: 2015-09-12 | Discharge: 2015-09-16 | DRG: 308 | Disposition: A | Discharge status: Skilled Nursing Facility | Payer: MEDICARE | Attending: Internal Medicine | Admitting: Internal Medicine

## 2015-09-12 ENCOUNTER — Encounter (HOSPITAL_COMMUNITY): Payer: Self-pay | Admitting: *Deleted

## 2015-09-12 DIAGNOSIS — I472 Ventricular tachycardia, unspecified: Secondary | ICD-10-CM

## 2015-09-12 DIAGNOSIS — S81812A Laceration without foreign body, left lower leg, initial encounter: Secondary | ICD-10-CM | POA: Diagnosis present

## 2015-09-12 DIAGNOSIS — E876 Hypokalemia: Secondary | ICD-10-CM | POA: Diagnosis present

## 2015-09-12 DIAGNOSIS — E1169 Type 2 diabetes mellitus with other specified complication: Secondary | ICD-10-CM

## 2015-09-12 DIAGNOSIS — Z87891 Personal history of nicotine dependence: Secondary | ICD-10-CM | POA: Diagnosis not present

## 2015-09-12 DIAGNOSIS — E872 Acidosis, unspecified: Secondary | ICD-10-CM

## 2015-09-12 DIAGNOSIS — D62 Acute posthemorrhagic anemia: Secondary | ICD-10-CM | POA: Diagnosis present

## 2015-09-12 DIAGNOSIS — I493 Ventricular premature depolarization: Secondary | ICD-10-CM | POA: Diagnosis present

## 2015-09-12 DIAGNOSIS — F419 Anxiety disorder, unspecified: Secondary | ICD-10-CM | POA: Diagnosis present

## 2015-09-12 DIAGNOSIS — Y92002 Bathroom of unspecified non-institutional (private) residence single-family (private) house as the place of occurrence of the external cause: Secondary | ICD-10-CM | POA: Diagnosis not present

## 2015-09-12 DIAGNOSIS — I10 Essential (primary) hypertension: Secondary | ICD-10-CM | POA: Diagnosis not present

## 2015-09-12 DIAGNOSIS — I2781 Cor pulmonale (chronic): Secondary | ICD-10-CM | POA: Diagnosis present

## 2015-09-12 DIAGNOSIS — E785 Hyperlipidemia, unspecified: Secondary | ICD-10-CM | POA: Diagnosis not present

## 2015-09-12 DIAGNOSIS — Z7984 Long term (current) use of oral hypoglycemic drugs: Secondary | ICD-10-CM | POA: Diagnosis not present

## 2015-09-12 DIAGNOSIS — G4733 Obstructive sleep apnea (adult) (pediatric): Secondary | ICD-10-CM | POA: Diagnosis present

## 2015-09-12 DIAGNOSIS — I5032 Chronic diastolic (congestive) heart failure: Secondary | ICD-10-CM | POA: Diagnosis present

## 2015-09-12 DIAGNOSIS — N179 Acute kidney failure, unspecified: Secondary | ICD-10-CM | POA: Diagnosis present

## 2015-09-12 DIAGNOSIS — E118 Type 2 diabetes mellitus with unspecified complications: Secondary | ICD-10-CM | POA: Diagnosis not present

## 2015-09-12 DIAGNOSIS — A419 Sepsis, unspecified organism: Secondary | ICD-10-CM | POA: Diagnosis present

## 2015-09-12 DIAGNOSIS — R791 Abnormal coagulation profile: Secondary | ICD-10-CM | POA: Diagnosis present

## 2015-09-12 DIAGNOSIS — E119 Type 2 diabetes mellitus without complications: Secondary | ICD-10-CM

## 2015-09-12 DIAGNOSIS — W1839XA Other fall on same level, initial encounter: Secondary | ICD-10-CM | POA: Diagnosis present

## 2015-09-12 DIAGNOSIS — I110 Hypertensive heart disease with heart failure: Secondary | ICD-10-CM | POA: Diagnosis present

## 2015-09-12 DIAGNOSIS — L8990 Pressure ulcer of unspecified site, unspecified stage: Secondary | ICD-10-CM | POA: Diagnosis present

## 2015-09-12 DIAGNOSIS — K7010 Alcoholic hepatitis without ascites: Secondary | ICD-10-CM | POA: Diagnosis present

## 2015-09-12 DIAGNOSIS — I482 Chronic atrial fibrillation: Secondary | ICD-10-CM | POA: Diagnosis present

## 2015-09-12 DIAGNOSIS — D684 Acquired coagulation factor deficiency: Secondary | ICD-10-CM | POA: Diagnosis present

## 2015-09-12 DIAGNOSIS — E871 Hypo-osmolality and hyponatremia: Secondary | ICD-10-CM | POA: Diagnosis present

## 2015-09-12 DIAGNOSIS — Z7901 Long term (current) use of anticoagulants: Secondary | ICD-10-CM

## 2015-09-12 DIAGNOSIS — Z6835 Body mass index (BMI) 35.0-35.9, adult: Secondary | ICD-10-CM | POA: Diagnosis not present

## 2015-09-12 DIAGNOSIS — K2270 Barrett's esophagus without dysplasia: Secondary | ICD-10-CM | POA: Diagnosis present

## 2015-09-12 DIAGNOSIS — F10239 Alcohol dependence with withdrawal, unspecified: Secondary | ICD-10-CM | POA: Diagnosis present

## 2015-09-12 DIAGNOSIS — I272 Other secondary pulmonary hypertension: Secondary | ICD-10-CM | POA: Diagnosis present

## 2015-09-12 DIAGNOSIS — K219 Gastro-esophageal reflux disease without esophagitis: Secondary | ICD-10-CM | POA: Diagnosis present

## 2015-09-12 DIAGNOSIS — I480 Paroxysmal atrial fibrillation: Secondary | ICD-10-CM | POA: Diagnosis not present

## 2015-09-12 DIAGNOSIS — I4891 Unspecified atrial fibrillation: Secondary | ICD-10-CM | POA: Diagnosis present

## 2015-09-12 DIAGNOSIS — R7989 Other specified abnormal findings of blood chemistry: Secondary | ICD-10-CM | POA: Diagnosis present

## 2015-09-12 DIAGNOSIS — R55 Syncope and collapse: Secondary | ICD-10-CM | POA: Diagnosis not present

## 2015-09-12 LAB — BASIC METABOLIC PANEL
Anion gap: 15 (ref 5–15)
BUN: 11 mg/dL (ref 6–20)
CALCIUM: 7.8 mg/dL — AB (ref 8.9–10.3)
CO2: 22 mmol/L (ref 22–32)
Chloride: 94 mmol/L — ABNORMAL LOW (ref 101–111)
Creatinine, Ser: 1.35 mg/dL — ABNORMAL HIGH (ref 0.61–1.24)
GFR calc Af Amer: 59 mL/min — ABNORMAL LOW (ref >60)
GFR, EST NON AFRICAN AMERICAN: 50 mL/min — AB (ref >60)
GLUCOSE: 208 mg/dL — AB (ref 65–99)
Potassium: 4.4 mmol/L (ref 3.5–5.1)
Sodium: 131 mmol/L — ABNORMAL LOW (ref 135–145)

## 2015-09-12 LAB — TROPONIN I
TROPONIN I: 0.03 ng/mL (ref <0.031)
Troponin I: 0.03 ng/mL (ref <0.031)

## 2015-09-12 LAB — COMPREHENSIVE METABOLIC PANEL
ALT: 63 U/L (ref 17–63)
AST: 89 U/L — ABNORMAL HIGH (ref 15–41)
Albumin: 2.8 g/dL — ABNORMAL LOW (ref 3.5–5.0)
Alkaline Phosphatase: 67 U/L (ref 38–126)
Anion gap: 19 — ABNORMAL HIGH (ref 5–15)
BUN: 10 mg/dL (ref 6–20)
CO2: 16 mmol/L — ABNORMAL LOW (ref 22–32)
Calcium: 7.7 mg/dL — ABNORMAL LOW (ref 8.9–10.3)
Chloride: 96 mmol/L — ABNORMAL LOW (ref 101–111)
Creatinine, Ser: 1.56 mg/dL — ABNORMAL HIGH (ref 0.61–1.24)
GFR calc Af Amer: 49 mL/min — ABNORMAL LOW (ref >60)
GFR calc non Af Amer: 42 mL/min — ABNORMAL LOW (ref >60)
Glucose, Bld: 205 mg/dL — ABNORMAL HIGH (ref 65–99)
Potassium: 3.1 mmol/L — ABNORMAL LOW (ref 3.5–5.1)
Sodium: 131 mmol/L — ABNORMAL LOW (ref 135–145)
Total Bilirubin: 2.3 mg/dL — ABNORMAL HIGH (ref 0.3–1.2)
Total Protein: 5.9 g/dL — ABNORMAL LOW (ref 6.5–8.1)

## 2015-09-12 LAB — CBC WITH DIFFERENTIAL/PLATELET
BASOS ABS: 0 10*3/uL (ref 0.0–0.1)
BASOS PCT: 0 %
Basophils Absolute: 0 10*3/uL (ref 0.0–0.1)
Basophils Relative: 0 %
EOS ABS: 0 10*3/uL (ref 0.0–0.7)
Eosinophils Absolute: 0 10*3/uL (ref 0.0–0.7)
Eosinophils Relative: 0 %
Eosinophils Relative: 0 %
HCT: 39.5 % (ref 39.0–52.0)
HCT: 30.9 % — ABNORMAL LOW (ref 39.0–52.0)
HEMOGLOBIN: 10.7 g/dL — AB (ref 13.0–17.0)
Hemoglobin: 13.8 g/dL (ref 13.0–17.0)
LYMPHS ABS: 1.1 10*3/uL (ref 0.7–4.0)
Lymphocytes Relative: 5 %
Lymphocytes Relative: 4 %
Lymphs Abs: 0.9 10*3/uL (ref 0.7–4.0)
MCH: 32.1 pg (ref 26.0–34.0)
MCH: 31.8 pg (ref 26.0–34.0)
MCHC: 34.9 g/dL (ref 30.0–36.0)
MCHC: 34.6 g/dL (ref 30.0–36.0)
MCV: 91.9 fL (ref 78.0–100.0)
MCV: 91.7 fL (ref 78.0–100.0)
Monocytes Absolute: 1 10*3/uL (ref 0.1–1.0)
Monocytes Absolute: 1.4 10*3/uL — ABNORMAL HIGH (ref 0.1–1.0)
Monocytes Relative: 5 %
Monocytes Relative: 5 %
NEUTROS ABS: 25.9 10*3/uL — AB (ref 1.7–7.7)
Neutro Abs: 16.7 10*3/uL — ABNORMAL HIGH (ref 1.7–7.7)
Neutrophils Relative %: 90 %
Neutrophils Relative %: 91 %
PLATELETS: 178 10*3/uL (ref 150–400)
Platelets: 199 10*3/uL (ref 150–400)
RBC: 4.3 MIL/uL (ref 4.22–5.81)
RBC: 3.37 MIL/uL — ABNORMAL LOW (ref 4.22–5.81)
RDW: 14.4 % (ref 11.5–15.5)
RDW: 14.6 % (ref 11.5–15.5)
WBC: 18.7 10*3/uL — ABNORMAL HIGH (ref 4.0–10.5)
WBC: 28.4 10*3/uL — ABNORMAL HIGH (ref 4.0–10.5)

## 2015-09-12 LAB — PHOSPHORUS: Phosphorus: 4.6 mg/dL (ref 2.5–4.6)

## 2015-09-12 LAB — URINALYSIS, ROUTINE W REFLEX MICROSCOPIC
Bilirubin Urine: NEGATIVE
Glucose, UA: NEGATIVE mg/dL
Ketones, ur: NEGATIVE mg/dL
Leukocytes, UA: NEGATIVE
Nitrite: NEGATIVE
Protein, ur: NEGATIVE mg/dL
Specific Gravity, Urine: 1.008 (ref 1.005–1.030)
Urobilinogen, UA: 1 mg/dL (ref 0.0–1.0)
pH: 5.5 (ref 5.0–8.0)

## 2015-09-12 LAB — MAGNESIUM: Magnesium: 1.7 mg/dL (ref 1.7–2.4)

## 2015-09-12 LAB — URINE MICROSCOPIC-ADD ON

## 2015-09-12 LAB — PROTIME-INR
INR: 5.33 (ref 0.00–1.49)
Prothrombin Time: 48.2 seconds — ABNORMAL HIGH (ref 11.6–15.2)

## 2015-09-12 LAB — LACTIC ACID, PLASMA
Lactic Acid, Venous: 10.8 mmol/L (ref 0.5–2.0)
Lactic Acid, Venous: 9.2 mmol/L (ref 0.5–2.0)

## 2015-09-12 LAB — MRSA PCR SCREENING: MRSA BY PCR: NEGATIVE

## 2015-09-12 LAB — CK: Total CK: 129 U/L (ref 49–397)

## 2015-09-12 LAB — GLUCOSE, CAPILLARY: GLUCOSE-CAPILLARY: 213 mg/dL — AB (ref 65–99)

## 2015-09-12 MED ORDER — LORAZEPAM 2 MG/ML IJ SOLN
1.0000 mg | Freq: Four times a day (QID) | INTRAMUSCULAR | Status: AC | PRN
  Administered 2015-09-13: 1 mg via INTRAVENOUS

## 2015-09-12 MED ORDER — POTASSIUM CHLORIDE 10 MEQ/100ML IV SOLN
10.0000 meq | INTRAVENOUS | Status: DC
  Administered 2015-09-12 (×2): 10 meq via INTRAVENOUS
  Filled 2015-09-12 (×3): qty 100

## 2015-09-12 MED ORDER — AMIODARONE HCL IN DEXTROSE 360-4.14 MG/200ML-% IV SOLN
30.0000 mg/h | INTRAVENOUS | Status: DC
  Administered 2015-09-12 – 2015-09-13 (×2): 30 mg/h via INTRAVENOUS
  Filled 2015-09-12 (×3): qty 200

## 2015-09-12 MED ORDER — INSULIN ASPART 100 UNIT/ML ~~LOC~~ SOLN
0.0000 [IU] | Freq: Three times a day (TID) | SUBCUTANEOUS | Status: DC
  Administered 2015-09-13 – 2015-09-14 (×4): 3 [IU] via SUBCUTANEOUS
  Administered 2015-09-14 (×2): 2 [IU] via SUBCUTANEOUS

## 2015-09-12 MED ORDER — POTASSIUM CHLORIDE CRYS ER 20 MEQ PO TBCR
20.0000 meq | EXTENDED_RELEASE_TABLET | Freq: Two times a day (BID) | ORAL | Status: DC
  Administered 2015-09-12 – 2015-09-16 (×8): 20 meq via ORAL
  Filled 2015-09-12 (×8): qty 1

## 2015-09-12 MED ORDER — SODIUM CHLORIDE 0.9 % IV BOLUS (SEPSIS)
1000.0000 mL | Freq: Once | INTRAVENOUS | Status: AC
  Administered 2015-09-12: 1000 mL via INTRAVENOUS

## 2015-09-12 MED ORDER — ONDANSETRON HCL 4 MG/2ML IJ SOLN: 4.0000 mg | Freq: Four times a day (QID) | INTRAMUSCULAR | Status: DC | PRN

## 2015-09-12 MED ORDER — PIPERACILLIN-TAZOBACTAM 3.375 G IVPB 30 MIN
3.3750 g | Freq: Once | INTRAVENOUS | Status: AC
  Administered 2015-09-12: 3.375 g via INTRAVENOUS
  Filled 2015-09-12: qty 50

## 2015-09-12 MED ORDER — SODIUM CHLORIDE 0.9 % IV SOLN
INTRAVENOUS | Status: DC
  Administered 2015-09-12: 18:00:00 via INTRAVENOUS

## 2015-09-12 MED ORDER — ADULT MULTIVITAMIN W/MINERALS CH
1.0000 | ORAL_TABLET | Freq: Every day | ORAL | Status: DC
Start: 2015-09-12 — End: 2015-09-16
  Administered 2015-09-12 – 2015-09-16 (×5): 1 via ORAL
  Filled 2015-09-12 (×5): qty 1

## 2015-09-12 MED ORDER — POTASSIUM CHLORIDE IN NACL 20-0.9 MEQ/L-% IV SOLN
INTRAVENOUS | Status: DC
  Administered 2015-09-12 – 2015-09-14 (×3): via INTRAVENOUS
  Filled 2015-09-12 (×4): qty 1000

## 2015-09-12 MED ORDER — THIAMINE HCL 100 MG/ML IJ SOLN: 100.0000 mg | Freq: Every day | INTRAMUSCULAR | Status: DC

## 2015-09-12 MED ORDER — PANTOPRAZOLE SODIUM 40 MG PO TBEC
40.0000 mg | DELAYED_RELEASE_TABLET | Freq: Every day | ORAL | Status: DC
  Administered 2015-09-13 – 2015-09-16 (×4): 40 mg via ORAL
  Filled 2015-09-12 (×4): qty 1

## 2015-09-12 MED ORDER — PIPERACILLIN-TAZOBACTAM 3.375 G IVPB 30 MIN: 3.3750 g | Freq: Three times a day (TID) | INTRAVENOUS | Status: DC

## 2015-09-12 MED ORDER — LORAZEPAM 2 MG/ML IJ SOLN
0.0000 mg | Freq: Two times a day (BID) | INTRAMUSCULAR | Status: DC
  Administered 2015-09-14: 2 mg via INTRAVENOUS
  Filled 2015-09-12: qty 1

## 2015-09-12 MED ORDER — LORAZEPAM 2 MG/ML IJ SOLN
0.0000 mg | Freq: Four times a day (QID) | INTRAMUSCULAR | Status: AC
  Administered 2015-09-12 – 2015-09-13 (×2): 1 mg via INTRAVENOUS
  Administered 2015-09-13 (×2): 2 mg via INTRAVENOUS
  Administered 2015-09-13 – 2015-09-14 (×4): 1 mg via INTRAVENOUS
  Filled 2015-09-12 (×9): qty 1

## 2015-09-12 MED ORDER — LIDOCAINE-EPINEPHRINE 1 %-1:100000 IJ SOLN
10.0000 mL | Freq: Once | INTRAMUSCULAR | Status: AC
  Administered 2015-09-12: 10 mL
  Filled 2015-09-12: qty 1

## 2015-09-12 MED ORDER — PRAVASTATIN SODIUM 20 MG PO TABS: 20.0000 mg | ORAL_TABLET | Freq: Every day | ORAL | Status: DC

## 2015-09-12 MED ORDER — WARFARIN SODIUM 5 MG PO TABS
0.0000 mg | ORAL_TABLET | Freq: Every day | ORAL | Status: DC
Start: 2015-09-13 — End: 2015-09-12

## 2015-09-12 MED ORDER — PIPERACILLIN-TAZOBACTAM 3.375 G IVPB
3.3750 g | Freq: Three times a day (TID) | INTRAVENOUS | Status: DC
  Administered 2015-09-12 – 2015-09-16 (×11): 3.375 g via INTRAVENOUS
  Filled 2015-09-12 (×14): qty 50

## 2015-09-12 MED ORDER — LORAZEPAM 2 MG/ML IJ SOLN
1.0000 mg | Freq: Once | INTRAMUSCULAR | Status: AC
  Administered 2015-09-12: 1 mg via INTRAVENOUS
  Filled 2015-09-12: qty 1

## 2015-09-12 MED ORDER — LOSARTAN POTASSIUM 50 MG PO TABS: 100.0000 mg | ORAL_TABLET | Freq: Every day | ORAL | Status: DC

## 2015-09-12 MED ORDER — POTASSIUM CHLORIDE 10 MEQ/100ML IV SOLN
10.0000 meq | INTRAVENOUS | Status: DC
  Administered 2015-09-12: 10 meq via INTRAVENOUS
  Filled 2015-09-12: qty 100

## 2015-09-12 MED ORDER — VANCOMYCIN HCL 10 G IV SOLR
1250.0000 mg | INTRAVENOUS | Status: DC
  Administered 2015-09-13 – 2015-09-14 (×2): 1250 mg via INTRAVENOUS
  Filled 2015-09-12 (×3): qty 1250

## 2015-09-12 MED ORDER — FLUOXETINE HCL 20 MG PO CAPS
20.0000 mg | ORAL_CAPSULE | Freq: Every day | ORAL | Status: DC
  Administered 2015-09-13 – 2015-09-16 (×4): 20 mg via ORAL
  Filled 2015-09-12 (×4): qty 1

## 2015-09-12 MED ORDER — WARFARIN - PHARMACIST DOSING INPATIENT: Freq: Every day | Status: DC

## 2015-09-12 MED ORDER — POTASSIUM CHLORIDE 10 MEQ/100ML IV SOLN: 10.0000 meq | INTRAVENOUS | Status: DC

## 2015-09-12 MED ORDER — LORAZEPAM 2 MG/ML IJ SOLN
2.0000 mg | Freq: Once | INTRAMUSCULAR | Status: AC
  Administered 2015-09-12: 2 mg via INTRAVENOUS
  Filled 2015-09-12: qty 1

## 2015-09-12 MED ORDER — ONDANSETRON HCL 4 MG PO TABS: 4.0000 mg | ORAL_TABLET | Freq: Four times a day (QID) | ORAL | Status: DC | PRN

## 2015-09-12 MED ORDER — VITAMIN B-1 100 MG PO TABS
100.0000 mg | ORAL_TABLET | Freq: Every day | ORAL | Status: DC
Start: 2015-09-12 — End: 2015-09-16
  Administered 2015-09-12 – 2015-09-16 (×5): 100 mg via ORAL
  Filled 2015-09-12 (×5): qty 1

## 2015-09-12 MED ORDER — AMIODARONE LOAD VIA INFUSION
150.0000 mg | Freq: Once | INTRAVENOUS | Status: AC
  Administered 2015-09-12: 150 mg via INTRAVENOUS
  Filled 2015-09-12: qty 83.34

## 2015-09-12 MED ORDER — FOLIC ACID 1 MG PO TABS
1.0000 mg | ORAL_TABLET | Freq: Every day | ORAL | Status: DC
  Administered 2015-09-12 – 2015-09-16 (×5): 1 mg via ORAL
  Filled 2015-09-12 (×5): qty 1

## 2015-09-12 MED ORDER — AMIODARONE HCL IN DEXTROSE 360-4.14 MG/200ML-% IV SOLN
60.0000 mg/h | INTRAVENOUS | Status: DC
  Administered 2015-09-12: 60 mg/h via INTRAVENOUS
  Filled 2015-09-12: qty 200

## 2015-09-12 MED ORDER — VANCOMYCIN HCL 10 G IV SOLR
2000.0000 mg | Freq: Once | INTRAVENOUS | Status: AC
  Administered 2015-09-12: 2000 mg via INTRAVENOUS
  Filled 2015-09-12: qty 2000

## 2015-09-12 MED ORDER — LORAZEPAM 1 MG PO TABS
1.0000 mg | ORAL_TABLET | Freq: Four times a day (QID) | ORAL | Status: AC | PRN
  Administered 2015-09-14: 1 mg via ORAL
  Filled 2015-09-12 (×2): qty 1

## 2015-09-12 MED ORDER — MAGNESIUM SULFATE 4 GM/100ML IV SOLN
4.0000 g | Freq: Once | INTRAVENOUS | Status: AC
  Administered 2015-09-12: 4 g via INTRAVENOUS
  Filled 2015-09-12: qty 100

## 2015-09-12 NOTE — ED Notes (Signed)
EMS-patient reports going the bathroom around 7am and he thinks he cut his left mid lower leg on axillary axil on the counter. Patient states once he cut his leg he laid on the floor as he was unable to get off the floor. After several hours of laying on the floor patient crawled to the living room to get his phone and called 911. Patient with significant amount of blood loss on scene. Initial BP 90 systolic. Patient very cool to touch. Initial oral temp reading 94.

## 2015-09-12 NOTE — ED Notes (Signed)
Patients bilateral lower legs cleaned for approx 30 minutes to clean wet and dry blood off of patients legs.

## 2015-09-12 NOTE — H&P (Signed)
[facility] History and Physical  Edwin [name] DQQ:[id number] DOB: October 09, 1941 DOA: 09/12/2015  Referring physician: Dorie Rank, MD PCP: Phineas Inches, MD   Chief Complaint: "Fall and leg cut".  HPI: Kwesi Sangha is a 74 y.o. male with a past medical history of chronic atrial fibrillation on warfarin, obstructive sleep apnea, Barrett's esophagus, hypertension, type 2 diabetes, morbid obesity, hyperlipidemia, alcohol dependence, anxiety who was brought to the emergency department via EMS after having a fall at home. Per patient, he woke up in the morning and was half asleep when he went to the bathroom. He does not remember having any other specific symptoms and he is not sure whether he passed out or not, but he fell in the bathroom sustaining a left leg laceration. He is states that he felt so weak that he was unable to get up, but subsequently was able to drag himself from the bathroom to wear his cell phone was and called 911. He denies any recent chest pain, diaphoresis, PND or orthopnea. He has occasional palpitations and occasional lower extremity edema.   While in the ER, the patient had loss of consciousness due to a self-limited episode of ventricular tachycardia. He does not remember having any other specific symptoms with this episode as well.  He drinks about a 12 pack of beer every day. He last had a beer around 02:00. He states that he lost his wife of 34 years about 5 months ago. He is states that besides grieving her death, he often finds himself overwhelmed by the size of the house and the bills to pay. He has a history of anxiety and states that he has been very anxious, he does not find himself to be depressed. He denies suicidal or homicidal thoughts.     Review of Systems:  Constitutional:  Positive fatigue. No weight loss, night sweats, Fevers, chills,   HEENT:  No headaches, Difficulty swallowing,Tooth/dental problems,Sore throat,  No sneezing, itching, ear  ache, nasal congestion, post nasal drip,  Cardio-vascular:  Positive dizziness, syncope, palpitations. Occasional swelling in lower extremities No chest pain, Orthopnea, PND,  anasarca   GI:  No heartburn, indigestion, abdominal pain, nausea, vomiting, diarrhea, change in bowel habits, loss of appetite  Resp:  No shortness of breath with exertion or at rest. No excess mucus, no productive cough, No non-productive cough, No coughing up of blood.No change in color of mucus.No wheezing.No chest wall deformity  Skin:  no rash or lesions.  GU:  no dysuria, change in color of urine, no urgency or frequency. No flank pain.  Musculoskeletal:  No joint pain or swelling. No decreased range of motion. No back pain.  Psych:  No change in mood or affect. No depression or anxiety. No memory loss.   Past Medical History  Diagnosis Date  . Hypertension     benign essential hypertension  . Diabetes mellitus     NIDDM  . Morbid obesity ([place])   . Pure hypercholesterolemia   . Long-term (current) use of anticoagulants   . Varicose veins of legs   . Pulmonary hypertension (HCC) MODERATE  . Cor pulmonale, chronic ([place])   . Chronic right-sided CHF (congestive heart failure) ([place])   . Atrial fibrillation ([place]) CARDIOLOGIST-- DR Einar Gip  . Anxiety   . Shortness of breath     with exertion   . Arthritis   . Adynamic ileus ([place])   . GERD (gastroesophageal reflux disease)   . Cholelithiasis   . OSA (obstructive sleep apnea) 08/29/2013  .  Barrett esophagus   . Colon polyps     TUBULAR ADENOMA (2) & HYPERPLASTIC POLYP  . GI bleed   . Sleep apnea     no CPAP   Past Surgical History  Procedure Laterality Date  . Cardiovascular stress test  07-19-2012  DR Einar Gip    PROMINENT DIAPHRAGMATIC ATTENUATION/ NORMAL LVEF/ LOW RISK STUDY  . Transthoracic echocardiogram  05-16-2012    LOW NORMAL LVEF/ MOD. RV/ MILD HYPOKINESIS/ MOD. PULMONARY HTN/ CHRONIC COR PUMONALE/ NO SIG. CHANGE FROM 12-01-2010  . Mouth  surgery    . Benign mole removal from nose     . Knee arthroscopy with medial menisectomy Left 04/23/2013    Procedure: LEFT KNEE ARTHROSCOPY WITH PARTIAL MEDIAL MENISECTOMY;  Surgeon: Magnus Sinning, MD;  Location: [facility];  Service: Orthopedics;  Laterality: Left;  . Esophagogastroduodenoscopy (egd) with propofol N/A 07/28/2014    Procedure: ESOPHAGOGASTRODUODENOSCOPY (EGD) WITH PROPOFOL;  Surgeon: Irene Shipper, MD;  Location: [facility];  Service: Endoscopy;  Laterality: N/A;  . Colonoscopy     Social History:  reports that he quit smoking about 28 years ago. His smoking use included Cigarettes. He smoked 1.00 pack per day. He has never used smokeless tobacco. He reports that he drinks alcohol. He reports that he does not use illicit drugs.  Allergies  Allergen Reactions  . Lisinopril Swelling    Tongue swelling    Family History  Problem Relation Age of Onset  . Heart disease Mother   . Hypertension Father   . Colon cancer Neg Hx   . Throat cancer Paternal Uncle   . Prostate cancer Neg Hx   . Rectal cancer Neg Hx   . Stomach cancer Neg Hx      Prior to Admission medications   Medication Sig Start Date End Date Taking? Authorizing Provider  FLUoxetine (PROZAC) 20 MG capsule Take 20 mg by mouth daily. 08/27/15  Yes Historical Provider, MD  furosemide (LASIX) 40 MG tablet Take 2 tablets (80 mg total) by mouth 2 (two) times daily. 07/10/13  Yes Charlynne Cousins, MD  losartan (COZAAR) 100 MG tablet Take 100 mg by mouth daily.   Yes Historical Provider, MD  metFORMIN (GLUCOPHAGE) 500 MG tablet Take 500 mg by mouth daily.    Yes Historical Provider, MD  pantoprazole (PROTONIX) 40 MG tablet Take 1 tablet (40 mg total) by mouth daily. 07/29/14  Yes Oswald Hillock, MD  potassium chloride SA (K-DUR,KLOR-CON) 20 MEQ tablet Take 1 tablet (20 mEq total) by mouth 2 (two) times daily. 07/30/13  Yes Amy D Clegg, NP  pravastatin (PRAVACHOL) 20 MG tablet Take 1 tablet (20 mg total) by mouth daily  after supper. 07/10/13  Yes Charlynne Cousins, MD  warfarin (COUMADIN) 5 MG tablet Take 0-5 mg by mouth daily after supper.    Yes Historical Provider, MD   Physical Exam: Filed Vitals:   09/12/15 1932 09/12/15 1933 09/12/15 1945 09/12/15 2032  BP: 100/63  115/47 113/68  Pulse:  73 85   Temp:    97.6 F (36.4 C)  TempSrc:    Oral  Resp:  19 17 22   Height:    5\' 11"  (1.803 m)  Weight:    116.1 kg (255 lb 15.3 oz)  SpO2:  97% 96% 96%    Wt Readings from Last 3 Encounters:  09/12/15 116.1 kg (255 lb 15.3 oz)  07/13/15 120.203 kg (265 lb)  10/13/14 134.265 kg (296 lb)    General:  Appears  anxious. Eyes: PERRL, normal lids, irises & conjunctiva ENT: grossly normal hearing, lips & tongue are mildly dry. Neck: no LAD, masses or thyromegaly Cardiovascular: Irregularly irregular,, no m/r/g. No LE edema. Telemetry: Atrial fibrillation.  Respiratory: CTA bilaterally, no w/r/r. Normal respiratory effort. Abdomen: Obese with panus showing some erythema, Bowel sounds positive, soft, ntnd Skin: Left lower extremity laceration on the lower lateral tibial area. Musculoskeletal: grossly normal tone BUE/BLE Psychiatric: Mildly anxious, speech fluent and appropriate Neurologic: Positive tremors and tongue fasciculations, otherwise grossly non-focal.          Labs on Admission:  Basic Metabolic Panel:  Recent Labs Lab 09/12/15 1559 09/12/15 1606 09/12/15 2057  NA  --  131* 131*  K  --  3.1* 4.4  CL  --  96* 94*  CO2  --  16* 22  GLUCOSE  --  205* 208*  BUN  --  10 11  CREATININE  --  1.56* 1.35*  CALCIUM  --  7.7* 7.8*  MG 1.7  --   --   PHOS  --   --  4.6   Liver Function Tests:  Recent Labs Lab 09/12/15 1606  AST 89*  ALT 63  ALKPHOS 67  BILITOT 2.3*  PROT 5.9*  ALBUMIN 2.8*   CBC:  Recent Labs Lab 09/12/15 1501 09/12/15 2057  WBC 18.7* 28.4*  NEUTROABS 16.7* PENDING  HGB 13.8 10.7*  HCT 39.5 30.9*  MCV 91.9 91.7  PLT 199 178   Cardiac  Enzymes:  Recent Labs Lab 09/12/15 1606 09/12/15 2057  CKTOTAL 129  --   TROPONINI 0.03 0.03    CBG:  Recent Labs Lab 09/12/15 2254  GLUCAP 213*    Radiological Exams on Admission: Ct Head Wo Contrast  09/12/2015  CLINICAL DATA:  Recent fall, anticoagulated EXAM: CT HEAD WITHOUT CONTRAST TECHNIQUE: Contiguous axial images were obtained from the base of the skull through the vertex without intravenous contrast. COMPARISON:  None. FINDINGS: Bony calvarium is intact. Mucosal changes are noted within the left maxillary antrum of uncertain chronicity. Mild atrophic changes are noted commenced with the patient's given age. No findings to suggest acute hemorrhage, acute infarction or space-occupying mass lesion are noted. IMPRESSION: Mild atrophic changes without acute abnormality. Electronically Signed   By: Inez Catalina M.D.   On: 09/12/2015 18:45   Dg Chest Portable 1 View  09/12/2015  CLINICAL DATA:  Fall today and became unresponsive at hospital. EXAM: PORTABLE CHEST 1 VIEW COMPARISON:  07/26/2014 FINDINGS: Lungs are hypoinflated without focal consolidation, effusion or pneumothorax. Note that the lateral left lung base is cut off the film. Remainder of the exam is unchanged. IMPRESSION: Hypoinflation without acute cardiopulmonary disease. Electronically Signed   By: Marin Olp M.D.   On: 09/12/2015 15:05   Echocardiogram: 07/27/2014  LV EF: 55% -  60%  ------------------------------------------------------------------- Indications:   Dyspnea 786.09.  ------------------------------------------------------------------- History:  PMH: Former Smoker, ETOH. Atrial fibrillation. Congestive heart failure. Risk factors: Hypertension. Diabetes mellitus. Dyslipidemia.  ------------------------------------------------------------------- Study Conclusions  - Left ventricle: The cavity size was normal. Wall thickness was normal. Systolic function was normal. The estimated  ejection fraction was in the range of 55% to 60%. Although no diagnostic regional wall motion abnormality was identified, this possibility cannot be completely excluded on the basis of this study. The study is not technically sufficient to allow evaluation of LV diastolic function. - Left atrium: The atrium was severely dilated. - Right ventricle: The cavity size was dilated. Systolic function was reduced. - Right  atrium: The atrium was severely dilated.  EKG: Independently reviewed. Vent. rate 80 BPM PR interval * ms QRS duration 77 ms QT/QTc 399/460 ms P-R-T axes -1 48 78 Atrial fibrillation Premature ventricular complexes LOW VOLTAGE Non-specific ST-t changes  Assessment/Plan Principal Problem:   Ventricular tachyarrhythmia (HCC) Admit to stepdown for closer monitoring. Continue amiodarone IV infusion. Replace potassium and supplement magnesium. Monitor levels. Check echocardiogram in the morning. Cardiology is on the case.  Active Problems:   Atrial fibrillation (HCC) Continue cardiac monitoring. Replace and monitor electrolyte levels. Warfarin to be dosed by pharmacy.    Sepsis. Continue Zosyn and vancomycin. Unknown source of infection at this time. Follow-up blood cultures and sensitivity.    HTN (hypertension) Hold losartan and follow-up creatinine. Resume losartan once creatinine level has come down. Monitor blood pressure.    Hyperlipidemia Help pravastatin while the patient is acutely ill and due to the history of heavy alcohol use for the past few months. Patient's LFTs are abnormal.    OSA (obstructive sleep apnea) He does not use CPAP at night. Continue supplemental oxygen.    Type 2 diabetes mellitus (HCC) Hold metformin due to lactic acidosis. CBG monitoring with regular insulin sliding scale.       Hypokalemia Continue potassium replacement. Try to keep potassium level at 4 mmol per liter. Check magnesium level. Supplement  magnesium regardless given the patient's history of alcohol abuse and recent episode of ventricular tachycardia.    Hyponatremia Likely due to hemodilution secondary to heavy consumption of beer. Continue normal saline infusion and monitor sodium level.    Elevated lactic acid level Continue IV antibiotics, IV hydration, supplemental oxygen and hold metformin. Follow-up lactic acid level.    AKI (acute kidney injury) ([place]) Already has improved with IV hydration. Monitor BUN and creatinine.  Dr. Adrian Prows is the cardiologist on the case. Critical care medicine was consulted by the emergency department.   Code Status: Full code. DVT Prophylaxis: On warfarin from home. Family Communication: Disposition Plan: Admit to stepdown for close monitoring, continue amiodarone infusion, IV antibiotic therapy, electrolyte replacement, CIWA protocol.  Time spent: Over 90 minutes were spent during the process of this admission.  Reubin Milan [facility] Pager [phone].

## 2015-09-12 NOTE — ED Provider Notes (Signed)
CSN: [id number]     Arrival date & time 09/12/15  1416 History   First MD Initiated Contact with Patient 09/12/15 1422     Chief Complaint  Patient presents with  . Extremity Laceration     (Consider location/radiation/quality/duration/timing/severity/associated sxs/prior Treatment) HPI   74 year old male brought in after fall. Patient fell while ambulating in his bathroom at approximate 7:00 this morning. Patient is not exactly sure why he fell. He reports that he was "half asleep." He is unable to give a clear history as if whether this was a mechanical fall or otherwise. He sustained a left leg laceration during the fall. He was too weak to get up. It took him several hours before he was able to crawl to the phone to call for help. He is currently complaining of feeling very tired. He reports he did hit his head during the fall but denies any significant pain anywhere aside from his left leg. He is on Coumadin for history of atrial fibrillation. He reports that he drinks heavily and has been doing this since the death of his wife earlier this year. Last drink was last night.   Past Medical History  Diagnosis Date  . Hypertension     benign essential hypertension  . Diabetes mellitus     NIDDM  . Morbid obesity ([place])   . Pure hypercholesterolemia   . Long-term (current) use of anticoagulants   . Varicose veins of legs   . Pulmonary hypertension (HCC) MODERATE  . Cor pulmonale, chronic ([place])   . Chronic right-sided CHF (congestive heart failure) ([place])   . Atrial fibrillation ([place]) CARDIOLOGIST-- DR Einar Gip  . Anxiety   . Shortness of breath     with exertion   . Arthritis   . Adynamic ileus ([place])   . GERD (gastroesophageal reflux disease)   . Cholelithiasis   . OSA (obstructive sleep apnea) 08/29/2013  . Barrett esophagus   . Colon polyps     TUBULAR ADENOMA (2) & HYPERPLASTIC POLYP  . GI bleed   . Sleep apnea     no CPAP   Past Surgical History  Procedure Laterality Date   . Cardiovascular stress test  07-19-2012  DR Einar Gip    PROMINENT DIAPHRAGMATIC ATTENUATION/ NORMAL LVEF/ LOW RISK STUDY  . Transthoracic echocardiogram  05-16-2012    LOW NORMAL LVEF/ MOD. RV/ MILD HYPOKINESIS/ MOD. PULMONARY HTN/ CHRONIC COR PUMONALE/ NO SIG. CHANGE FROM 12-01-2010  . Mouth surgery    . Benign mole removal from nose     . Knee arthroscopy with medial menisectomy Left 04/23/2013    Procedure: LEFT KNEE ARTHROSCOPY WITH PARTIAL MEDIAL MENISECTOMY;  Surgeon: Magnus Sinning, MD;  Location: [facility];  Service: Orthopedics;  Laterality: Left;  . Esophagogastroduodenoscopy (egd) with propofol N/A 07/28/2014    Procedure: ESOPHAGOGASTRODUODENOSCOPY (EGD) WITH PROPOFOL;  Surgeon: Irene Shipper, MD;  Location: [facility];  Service: Endoscopy;  Laterality: N/A;  . Colonoscopy     Family History  Problem Relation Age of Onset  . Heart disease Mother   . Hypertension Father   . Colon cancer Neg Hx   . Throat cancer Paternal Uncle   . Prostate cancer Neg Hx   . Rectal cancer Neg Hx   . Stomach cancer Neg Hx    Social History  Substance Use Topics  . Smoking status: Former Smoker -- 1.00 packs/day    Types: Cigarettes    Quit date: 06/28/1987  . Smokeless tobacco: Never Used  . Alcohol Use:  0.0 oz/week    0 Standard drinks or equivalent per week     Comment: pint daily    Review of Systems  All systems reviewed and negative, other than as noted in HPI.   Allergies  Lisinopril  Home Medications   Prior to Admission medications   Medication Sig Start Date End Date Taking? Authorizing Provider  furosemide (LASIX) 40 MG tablet Take 2 tablets (80 mg total) by mouth 2 (two) times daily. 07/10/13   Charlynne Cousins, MD  metFORMIN (GLUCOPHAGE) 500 MG tablet Take 500 mg by mouth every evening.     Historical Provider, MD  pantoprazole (PROTONIX) 40 MG tablet Take 1 tablet (40 mg total) by mouth daily. 07/29/14   Oswald Hillock, MD  potassium chloride SA (K-DUR,KLOR-CON) 20 MEQ  tablet Take 1 tablet (20 mEq total) by mouth 2 (two) times daily. 07/30/13   Amy D Clegg, NP  pravastatin (PRAVACHOL) 20 MG tablet Take 1 tablet (20 mg total) by mouth daily after supper. 07/10/13   Charlynne Cousins, MD  warfarin (COUMADIN) 5 MG tablet Take 2.5 mg by mouth daily.     Historical Provider, MD   BP 96/62 mmHg  Pulse 71  Temp(Src) 97.8 F (36.6 C) (Oral)  Resp 17  Wt 255 lb (115.667 kg)  SpO2 97% Physical Exam  Constitutional:  Laying in bed. Pale and ill appearing.   HENT:  Head: Normocephalic and atraumatic.  Eyes: Conjunctivae are normal. Right eye exhibits no discharge. Left eye exhibits no discharge.  Neck: Neck supple.  Cardiovascular: Normal heart sounds.  Exam reveals no gallop and no friction rub.   No murmur heard. Irregularly irregular  Pulmonary/Chest: Effort normal and breath sounds normal. No respiratory distress.  Abdominal: Soft. He exhibits no distension. There is no tenderness.  Genitourinary:  Intertrigo beneath panus/groin, R>L.   Musculoskeletal: He exhibits no edema or tenderness.  Neurological:  Drowsy. Opens eyes to voice. Answers questions appropriately but somewhat slow to respond. Follows simple commands. No focal motor deficit.   Skin: There is pallor.  Cool extremities. ~12cm flapped laceration to L lateral lower leg. Brisk venous oozing.   Psychiatric: He has a normal mood and affect. His behavior is normal. Thought content normal.  Nursing note and vitals reviewed.   ED Course  Procedures (including critical care time)  LACERATION REPAIR Performed by: Virgel Manifold Authorized by: Virgel Manifold Consent: Verbal consent obtained. Risks and benefits: risks, benefits and alternatives were discussed Consent given by: patient Patient identity confirmed: provided demographic data Prepped and Draped in normal sterile fashion Wound explored  Laceration Location: L lower leg  Laceration Length: 12 cm  No Foreign Bodies seen or  palpated. Bleeding controlled with infiltration of lidocaine with epi. Explored and irrigated. No evidence of FB, arterial injury.   Anesthesia: local infiltration  Local anesthetic: lidocaine 1% w/ epinephrine  Anesthetic total: 5 ml  Irrigation method: syringe  Skin closure: 3-0 prolene  Number of sutures: 14  Technique: mostly horizontal mattress, a couple simple interrupted  Patient tolerance: Patient tolerated the procedure well with no immediate complications.  CENTRAL LINE Performed by: Virgel Manifold Consent: The procedure was performed in an emergent situation. Required items: required blood products, implants, devices, and special equipment available Patient identity confirmed: arm band and provided demographic data Time out: Immediately prior to procedure a "time out" was called to verify the correct patient, procedure, equipment, support staff and site/side marked as required. Indications: vascular access Anesthesia: local infiltration Local  anesthetic: lidocaine 1% with epinephrine Anesthetic total: 3 ml Patient sedated: no Preparation: skin prepped with 2% chlorhexidine Skin prep agent dried: skin prep agent completely dried prior to procedure Sterile barriers: sterile barriers used - cap, mask, sterile gloves, and large sterile sheet Hand hygiene: hand hygiene performed prior to central venous catheter insertion  Location details: L groin. Extensive intertrigo on R. Labs pending at time of placement, but presumably supratherapeutic INR with the way he was bleeding from IV sticks and felt femoral would be safest.   Catheter type: triple lumen Catheter size: 8 Fr Pre-procedure: landmarks identified Ultrasound guidance: yes Successful placement: yes Post-procedure: line sutured and dressing applied Assessment: blood return through all parts, free fluid flow Patient tolerance: Patient tolerated the procedure well with no immediate complications.  CRITICAL  CARE Performed by: Virgel Manifold Total critical care time: 60 minutes Critical care time was exclusive of separately billable procedures and treating other patients. Critical care was necessary to treat or prevent imminent or life-threatening deterioration. Critical care was time spent personally by me on the following activities: development of treatment plan with patient and/or surrogate as well as nursing, discussions with consultants, evaluation of patient's response to treatment, examination of patient, obtaining history from patient or surrogate, ordering and performing treatments and interventions, ordering and review of laboratory studies, ordering and review of radiographic studies, pulse oximetry and re-evaluation of patient's condition.   Labs Review Labs Reviewed  CBC WITH DIFFERENTIAL/PLATELET - Abnormal; Notable for the following:    WBC 18.7 (*)    Neutro Abs 16.7 (*)    All other components within normal limits  LACTIC ACID, PLASMA - Abnormal; Notable for the following:    Lactic Acid, Venous 10.8 (*)    All other components within normal limits  COMPREHENSIVE METABOLIC PANEL - Abnormal; Notable for the following:    Sodium 131 (*)    Potassium 3.1 (*)    Chloride 96 (*)    CO2 16 (*)    Glucose, Bld 205 (*)    Creatinine, Ser 1.56 (*)    Calcium 7.7 (*)    Total Protein 5.9 (*)    Albumin 2.8 (*)    AST 89 (*)    Total Bilirubin 2.3 (*)    GFR calc non Af Amer 42 (*)    GFR calc Af Amer 49 (*)    Anion gap 19 (*)    All other components within normal limits  PROTIME-INR - Abnormal; Notable for the following:    Prothrombin Time 48.2 (*)    INR 5.33 (*)    All other components within normal limits  CULTURE, BLOOD (ROUTINE X 2)  CULTURE, BLOOD (ROUTINE X 2)  CK  TROPONIN I  LACTIC ACID, PLASMA  URINALYSIS, ROUTINE W REFLEX MICROSCOPIC (NOT AT [facility])  MAGNESIUM    Imaging Review Dg Chest Portable 1 View  09/12/2015  CLINICAL DATA:  Fall today and became  unresponsive at hospital. EXAM: PORTABLE CHEST 1 VIEW COMPARISON:  07/26/2014 FINDINGS: Lungs are hypoinflated without focal consolidation, effusion or pneumothorax. Note that the lateral left lung base is cut off the film. Remainder of the exam is unchanged. IMPRESSION: Hypoinflation without acute cardiopulmonary disease. Electronically Signed   By: Marin Olp M.D.   On: 09/12/2015 15:05   I have personally reviewed and evaluated these images and lab results as part of my medical decision-making.   EKG Interpretation   Date/Time:  Sunday September 12 2015 15:43:38 EDT Ventricular Rate:  80 PR  Interval:    QRS Duration: 77 QT Interval:  399 QTC Calculation: 460 R Axis:   48 Text Interpretation:  Atrial fibrillation Premature ventricular complexes  LOW VOLTAGE Non-specific ST-t changes Confirmed by Wilson Singer  MD, Jessey Huyett  ([id number]) on 09/12/2015 5:08:54 PM      MDM   Final diagnoses:  V-tach ([place])  Lactic acidosis  AKI (acute kidney injury) ([place])  Leg laceration, left, initial encounter  Supratherapeutic INR     74yM with fall and unable to get up. Unclear if truly had syncope prior to arrival. On my initial exam patient with afib on monitor and frequent PVCs and short runs of NSVT. Shortly later he became unresponsive briefly and noted to be in [place] during this. Converted spontaneously and regained consciousness before nursing could even check for pulse. By the time I arrived to room, he was awake but pale and diaphoretic. Started on amiodarone. Difficult to obtain peripheral IV access and femoral central line placed. Afebrile, but developed hypotension and also with lactic acid >10 and leukocytosis. Empiric vanc/zosyn ordered. NS boluses which he has responded to thus far. AKI. CXR without acute abnormality. UA pending. PT/INR supratherapeutic. Leg laceration now hemostatic and closed. Vit K/FFP deferred unless head CT shows bleed. Discussed with CCM.  Virgel Manifold, MD 09/12/15 [phone]

## 2015-09-12 NOTE — ED Notes (Signed)
Dr. Kohut at bedside 

## 2015-09-12 NOTE — Progress Notes (Signed)
ANTICOAGULATION CONSULT NOTE - Initial Consult  Pharmacy Consult for vancomycin, zosyn, and coumadin Indication: sepsis and hx of afib, respectively  Allergies  Allergen Reactions  . Lisinopril Swelling    Tongue swelling    Patient Measurements: Height: 5\' 11"  (180.3 cm) Weight: 255 lb 15.3 oz (116.1 kg) IBW/kg (Calculated) : 75.3 Heparin Dosing Weight:  Vital Signs: Temp: 97.6 F (36.4 C) (10/23 2032) Temp Source: Oral (10/23 2032) BP: 113/68 mmHg (10/23 2032) Pulse Rate: 85 (10/23 1945)  Labs:  Recent Labs  09/12/15 1501 09/12/15 1606 09/12/15 2057  HGB 13.8  --  10.7*  HCT 39.5  --  30.9*  PLT 199  --  178  LABPROT  --  48.2*  --   INR  --  5.33*  --   CREATININE  --  1.56*  --   CKTOTAL  --  129  --   TROPONINI  --  0.03  --     Estimated Creatinine Clearance: 54.6 mL/min (by C-G formula based on Cr of 1.56).   Medical History: Past Medical History  Diagnosis Date  . Hypertension     benign essential hypertension  . Diabetes mellitus     NIDDM  . Morbid obesity ([place])   . Pure hypercholesterolemia   . Long-term (current) use of anticoagulants   . Varicose veins of legs   . Pulmonary hypertension (HCC) MODERATE  . Cor pulmonale, chronic ([place])   . Chronic right-sided CHF (congestive heart failure) ([place])   . Atrial fibrillation ([place]) CARDIOLOGIST-- DR Einar Gip  . Anxiety   . Shortness of breath     with exertion   . Arthritis   . Adynamic ileus ([place])   . GERD (gastroesophageal reflux disease)   . Cholelithiasis   . OSA (obstructive sleep apnea) 08/29/2013  . Barrett esophagus   . Colon polyps     TUBULAR ADENOMA (2) & HYPERPLASTIC POLYP  . GI bleed   . Sleep apnea     no CPAP    Medications:  Scheduled:  . [START ON 09/13/2015] FLUoxetine  20 mg Oral Daily  . folic acid  1 mg Oral Daily  . [START ON 09/13/2015] insulin aspart  0-15 Units Subcutaneous TID WC  . LORazepam  0-4 mg Intravenous Q6H   Followed by  . [START ON 09/14/2015]  LORazepam  0-4 mg Intravenous Q12H  . [START ON 09/13/2015] losartan  100 mg Oral Daily  . magnesium sulfate 1 - 4 g bolus IVPB  4 g Intravenous Once  . multivitamin with minerals  1 tablet Oral Daily  . [START ON 09/13/2015] pantoprazole  40 mg Oral Daily  . piperacillin-tazobactam (ZOSYN)  IV  3.375 g Intravenous Q8H  . potassium chloride  10 mEq Intravenous Q1 Hr x 2  . potassium chloride SA  20 mEq Oral BID  . [START ON 09/13/2015] pravastatin  20 mg Oral QPC supper  . thiamine  100 mg Oral Daily   Or  . thiamine  100 mg Intravenous Daily   Infusions:  . 0.9 % NaCl with KCl 20 mEq / L    . amiodarone      Assessment: 74 yo male with hx of afib will be continued on coumadin.  Home coumadin dose not sure but takes 5 mg tablet and takes between 0 to 1 full tablet daily.  INR today is 5.33.  Last dose was 09/11/15  Patient will also be continued on vancomycin and zosyn for sepsis.  Patient got one dose  of zosyn 3.375g iv x1 at 1616 and vancomycin 2g iv x1 at 1617.  CrCl ~54.6  Goal of Therapy:  INR 2-3 ; vancomycin trough 15-20  Monitor platelets by anticoagulation protocol: Yes   Plan:  - vancomycin 1250 mg iv q24h, 1st dose at 1700 tomorrow - zosyn 3.375g iv q8h (4h infusion), 1st dose at 2200 - no coumadin tonight - INR in am  Makaley Storts, Tsz-Yin 09/12/2015,9:46 PM

## 2015-09-12 NOTE — ED Notes (Signed)
PA at bedside stitching laceration

## 2015-09-12 NOTE — Consult Note (Signed)
CARDIOLOGY CONSULT NOTE  Patient ID: Edwin [name] MRN: [id number] DOB/AGE: Oct 05, 1941 74 y.o.  Admit date: 09/12/2015 Referring Physician:  EDP Primary Physician:  Phineas Inches, MD Reason for Consultation: ?Syncope, NSVT  HPI: Edwin [name]  is a 74 y.o. male with a history of chronic a.fib on warfarin, diabetes, hyperlipidemia, hypertension, sleep apnea, and chronic diastolic heart failure who presented to the ED on 09/12/2015 after sustaining a fall with left lower leg laceration. He states he had just woken up and walked into the bathroom with he lost his balance and fell against the wall and then slid to the floor.  He sustained a large laceration to his left lower leg. He states he didn't lose consciousness, however, he cannot recall how he injured his leg. It was several hours before he was able to get to the phone to call EMS.  Pt on warfarin for a.fib and EMS reported a significant amount of blood loss.  Patient placed on cardiac monitor on arrival to ED. At one point, pt became unresponsive for a few seconds and V-Tach was noted on the monitor.  He converted spontaneously and regained consciousness. He was then started on amiodarone gtt.  Prior to this, he had been having frequent PVCs and episodes of NSVT. Pt denies any symptoms of palpitations, heart racing, chest pain, SOB, or dizziness.   He continues to have frequent PVCs and ventricular couplets.  He has a history of alcohol abuse in the past and was doing well until his wife passed away 5 months ago. He admits to not eating right and drinking frequently since then. He states he is currently drinking 12-16 beers per day and not eating much. His last beer was this morning around 2am.     Past Medical History  Diagnosis Date  . Hypertension     benign essential hypertension  . Diabetes mellitus     NIDDM  . Morbid obesity ([place])   . Pure hypercholesterolemia   . Long-term (current) use of anticoagulants   . Varicose veins of legs    . Pulmonary hypertension (HCC) MODERATE  . Cor pulmonale, chronic ([place])   . Chronic right-sided CHF (congestive heart failure) ([place])   . Atrial fibrillation ([place]) CARDIOLOGIST-- DR Einar Gip  . Anxiety   . Shortness of breath     with exertion   . Arthritis   . Adynamic ileus ([place])   . GERD (gastroesophageal reflux disease)   . Cholelithiasis   . OSA (obstructive sleep apnea) 08/29/2013  . Barrett esophagus   . Colon polyps     TUBULAR ADENOMA (2) & HYPERPLASTIC POLYP  . GI bleed   . Sleep apnea     no CPAP     Past Surgical History  Procedure Laterality Date  . Cardiovascular stress test  07-19-2012  DR Einar Gip    PROMINENT DIAPHRAGMATIC ATTENUATION/ NORMAL LVEF/ LOW RISK STUDY  . Transthoracic echocardiogram  05-16-2012    LOW NORMAL LVEF/ MOD. RV/ MILD HYPOKINESIS/ MOD. PULMONARY HTN/ CHRONIC COR PUMONALE/ NO SIG. CHANGE FROM 12-01-2010  . Mouth surgery    . Benign mole removal from nose     . Knee arthroscopy with medial menisectomy Left 04/23/2013    Procedure: LEFT KNEE ARTHROSCOPY WITH PARTIAL MEDIAL MENISECTOMY;  Surgeon: Magnus Sinning, MD;  Location: [facility];  Service: Orthopedics;  Laterality: Left;  . Esophagogastroduodenoscopy (egd) with propofol N/A 07/28/2014    Procedure: ESOPHAGOGASTRODUODENOSCOPY (EGD) WITH PROPOFOL;  Surgeon: Irene Shipper, MD;  Location: [facility];  Service: Endoscopy;  Laterality: N/A;  . Colonoscopy       Family History  Problem Relation Age of Onset  . Heart disease Mother   . Hypertension Father   . Colon cancer Neg Hx   . Throat cancer Paternal Uncle   . Prostate cancer Neg Hx   . Rectal cancer Neg Hx   . Stomach cancer Neg Hx      Social History: Social History   Social History  . Marital Status: Married    Spouse Name: N/A  . Number of Children: N/A  . Years of Education: N/A   Occupational History  . retired    Social History Main Topics  . Smoking status: Former Smoker -- 1.00 packs/day    Types: Cigarettes    Quit  date: 06/28/1987  . Smokeless tobacco: Never Used  . Alcohol Use: 0.0 oz/week    0 Standard drinks or equivalent per week     Comment: pint daily  . Drug Use: No  . Sexual Activity: Not on file   Other Topics Concern  . Not on file   Social History Narrative      (Not in a hospital admission)   ROS: General: no fevers/chills/night sweats Eyes: no blurry vision, diplopia, or amaurosis ENT: no sore throat or hearing loss Resp: no cough, wheezing, or hemoptysis CV: chronic lower extremity edema; no chest pain, PND, orthopnea, or palpitations GI: no abdominal pain, nausea, vomiting, diarrhea, or constipation GU: no dysuria, frequency, or hematuria Skin: no rash Neuro: weakness and ?syncope this morning; no headache, numbness, tingling, or weakness of extremities Musculoskeletal: left lower leg pain from laceration; no joint pain or swelling Heme: no bleeding, DVT, or easy bruising Endo: no polydipsia or polyuria    Physical Exam: Blood pressure 120/62, pulse 63, temperature 98.7 F (37.1 C), temperature source Rectal, resp. rate 18, weight 115.667 kg (255 lb), SpO2 100 %.   General appearance: alert, cooperative, fatigued and no distress Neck: no adenopathy, no carotid bruit, no JVD, supple, symmetrical, trachea midline and thyroid not enlarged, symmetric, no tenderness/mass/nodules Lungs: clear to auscultation bilaterally Chest wall: no tenderness Heart: S1: variable and irregular, S2: normal and no S3 or S4 Extremities: edema 1-2+ bilaterally, venous stasis dermatitis noted and left lower leg laceration Pulses: 2+ and symmetric  Labs:   Lab Results  Component Value Date   WBC 18.7* 09/12/2015   HGB 13.8 09/12/2015   HCT 39.5 09/12/2015   MCV 91.9 09/12/2015   PLT 199 09/12/2015    Recent Labs Lab 09/12/15 1606  NA 131*  K 3.1*  CL 96*  CO2 16*  BUN 10  CREATININE 1.56*  CALCIUM 7.7*  PROT 5.9*  BILITOT 2.3*  ALKPHOS 67  ALT 63  AST 89*  GLUCOSE 205*     Lipid Panel  No results found for: CHOL, TRIG, HDL, CHOLHDL, VLDL, LDLCALC  BNP (last 3 results) No results for input(s): BNP in the last 8760 hours.  ProBNP (last 3 results) No results for input(s): PROBNP in the last 8760 hours.  HEMOGLOBIN A1C Lab Results  Component Value Date   HGBA1C 5.6 07/26/2014   MPG 114 07/26/2014    Cardiac Panel (last 3 results)  Recent Labs  09/12/15 1606  CKTOTAL 129  TROPONINI 0.03    Lab Results  Component Value Date   CKTOTAL 129 09/12/2015   TROPONINI 0.03 09/12/2015     TSH No results for input(s): TSH in the last 8760 hours.  EKG 09/12/2015:  atrial fibrillation with controlled VR at a rate of 80bpm, normal axis, IRBBB, frequent PVCs, non-specific ST abnormality in inferior leads  Echocardiogram 07/27/2014:  - Left ventricle: The cavity size was normal. Wall thickness was normal. Systolic function was normal. The estimated ejection fraction was in the range of 55% to 60%. Although no diagnostic regional wall motion abnormality was identified, this possibility cannot be completely excluded on the basis of this study. The study is not technically sufficient to allow evaluation of LV diastolic function. - Left atrium: The atrium was severely dilated. - Right ventricle: The cavity size was dilated. Systolic function was reduced. - Right atrium: The atrium was severely dilated.   Radiology: Ct Head Wo Contrast  09/12/2015  CLINICAL DATA:  Recent fall, anticoagulated EXAM: CT HEAD WITHOUT CONTRAST TECHNIQUE: Contiguous axial images were obtained from the base of the skull through the vertex without intravenous contrast. COMPARISON:  None. FINDINGS: Bony calvarium is intact. Mucosal changes are noted within the left maxillary antrum of uncertain chronicity. Mild atrophic changes are noted commenced with the patient's given age. No findings to suggest acute hemorrhage, acute infarction or space-occupying mass lesion are noted. IMPRESSION:  Mild atrophic changes without acute abnormality. Electronically Signed   By: Inez Catalina M.D.   On: 09/12/2015 18:45   Dg Chest Portable 1 View  09/12/2015  CLINICAL DATA:  Fall today and became unresponsive at hospital. EXAM: PORTABLE CHEST 1 VIEW COMPARISON:  07/26/2014 FINDINGS: Lungs are hypoinflated without focal consolidation, effusion or pneumothorax. Note that the lateral left lung base is cut off the film. Remainder of the exam is unchanged. IMPRESSION: Hypoinflation without acute cardiopulmonary disease. Electronically Signed   By: Marin Olp M.D.   On: 09/12/2015 15:05    Scheduled Meds:  Continuous Infusions: . sodium chloride 125 mL/hr at 09/12/15 1807  . amiodarone 60 mg/hr (09/12/15 1605)   Followed by  . amiodarone    . potassium chloride 10 mEq (09/12/15 1801)   PRN Meds:.  ASSESSMENT AND PLAN:  1. ?Syncope vs mechanical fall 2. NSVT with witnessed loss of consciousness in ED 3. Alcohol abuse 4. Benign essential hypertension 5. Type 2 Diabetes Mellitus 6. Hyperlipidemia 7. Chronic diastolic heart failure  Recommendation:  Uncertain as to whether pt had syncopal episode vs mechanical fall at home, however, based on witnessed LOC with NSVT in ED and pt's difficulty in recalling events surrounding fall at home, suspect arrhythmogenic syncope.  Pt admits to heavy alcohol consumption and poor eating over the past 5 months since the loss of his wife. Pt with mild hyponatremia and hypokalemia.  Magnesium pending.  Pt also with lactic acidosis and leukocytosis.  CXR negative, u/a negative, blood cultures pending. Pt initially hypotensive which resolved with IV fluids.  Pt presently on amiodarone gtt, continues to have frequent PVCs and ventricular couplets.  Will obtain echocardiogram to evaluate for alcoholic cardiomyopathy.    Rachel Bo, NP-C 09/12/2015, 7:01 PM [facility]. [facility] Pager: [phone] Office: [phone]

## 2015-09-12 NOTE — ED Provider Notes (Addendum)
Spoke with Cardiology.  Dr Einar Gip will evaluate the patient.  Dr Wilson Singer spoke with critical care earlier who recommended medical admission .   Dorie Rank, MD 09/12/15 [place], MD 09/15/15 [phone]

## 2015-09-13 ENCOUNTER — Ambulatory Visit (HOSPITAL_COMMUNITY): Payer: MEDICARE

## 2015-09-13 DIAGNOSIS — E871 Hypo-osmolality and hyponatremia: Secondary | ICD-10-CM

## 2015-09-13 DIAGNOSIS — I472 Ventricular tachycardia: Principal | ICD-10-CM

## 2015-09-13 DIAGNOSIS — R55 Syncope and collapse: Secondary | ICD-10-CM

## 2015-09-13 DIAGNOSIS — I480 Paroxysmal atrial fibrillation: Secondary | ICD-10-CM

## 2015-09-13 DIAGNOSIS — I10 Essential (primary) hypertension: Secondary | ICD-10-CM

## 2015-09-13 DIAGNOSIS — A419 Sepsis, unspecified organism: Secondary | ICD-10-CM

## 2015-09-13 DIAGNOSIS — N179 Acute kidney failure, unspecified: Secondary | ICD-10-CM

## 2015-09-13 LAB — COMPREHENSIVE METABOLIC PANEL
ALT: 79 U/L — AB (ref 17–63)
AST: 118 U/L — ABNORMAL HIGH (ref 15–41)
Albumin: 2.8 g/dL — ABNORMAL LOW (ref 3.5–5.0)
Alkaline Phosphatase: 64 U/L (ref 38–126)
Anion gap: 10 (ref 5–15)
BILIRUBIN TOTAL: 2.1 mg/dL — AB (ref 0.3–1.2)
BUN: 13 mg/dL (ref 6–20)
CALCIUM: 8.2 mg/dL — AB (ref 8.9–10.3)
CHLORIDE: 100 mmol/L — AB (ref 101–111)
CO2: 23 mmol/L (ref 22–32)
CREATININE: 1.24 mg/dL (ref 0.61–1.24)
GFR, EST NON AFRICAN AMERICAN: 56 mL/min — AB (ref >60)
Glucose, Bld: 210 mg/dL — ABNORMAL HIGH (ref 65–99)
Potassium: 4.3 mmol/L (ref 3.5–5.1)
Sodium: 133 mmol/L — ABNORMAL LOW (ref 135–145)
TOTAL PROTEIN: 5.7 g/dL — AB (ref 6.5–8.1)

## 2015-09-13 LAB — CBC WITH DIFFERENTIAL/PLATELET
BASOS ABS: 0 10*3/uL (ref 0.0–0.1)
BASOS PCT: 0 %
EOS ABS: 0 10*3/uL (ref 0.0–0.7)
EOS PCT: 0 %
HCT: 29.1 % — ABNORMAL LOW (ref 39.0–52.0)
HEMOGLOBIN: 10 g/dL — AB (ref 13.0–17.0)
LYMPHS ABS: 0.7 10*3/uL (ref 0.7–4.0)
Lymphocytes Relative: 3 %
MCH: 31.6 pg (ref 26.0–34.0)
MCHC: 34.4 g/dL (ref 30.0–36.0)
MCV: 92.1 fL (ref 78.0–100.0)
Monocytes Absolute: 1.9 10*3/uL — ABNORMAL HIGH (ref 0.1–1.0)
Monocytes Relative: 8 %
NEUTROS PCT: 89 %
Neutro Abs: 22.3 10*3/uL — ABNORMAL HIGH (ref 1.7–7.7)
PLATELETS: 176 10*3/uL (ref 150–400)
RBC: 3.16 MIL/uL — AB (ref 4.22–5.81)
RDW: 14.8 % (ref 11.5–15.5)
WBC: 24.8 10*3/uL — AB (ref 4.0–10.5)

## 2015-09-13 LAB — PROTIME-INR
INR: 6.03 — AB (ref 0.00–1.49)
PROTHROMBIN TIME: 51.8 s — AB (ref 11.6–15.2)

## 2015-09-13 LAB — GLUCOSE, CAPILLARY
GLUCOSE-CAPILLARY: 186 mg/dL — AB (ref 65–99)
GLUCOSE-CAPILLARY: 165 mg/dL — AB (ref 65–99)
Glucose-Capillary: 142 mg/dL — ABNORMAL HIGH (ref 65–99)
Glucose-Capillary: 133 mg/dL — ABNORMAL HIGH (ref 65–99)

## 2015-09-13 LAB — LACTIC ACID, PLASMA: LACTIC ACID, VENOUS: 3.4 mmol/L — AB (ref 0.5–2.0)

## 2015-09-13 LAB — TROPONIN I: TROPONIN I: 0.04 ng/mL — AB (ref <0.031)

## 2015-09-13 MED ORDER — AMIODARONE HCL 200 MG PO TABS
400.0000 mg | ORAL_TABLET | Freq: Two times a day (BID) | ORAL | Status: DC
  Administered 2015-09-13 (×2): 400 mg via ORAL
  Filled 2015-09-13 (×5): qty 2

## 2015-09-13 NOTE — Progress Notes (Signed)
Echocardiogram 2D Echocardiogram has been performed.  Edwin [name] 09/13/2015, 2:51 PM

## 2015-09-13 NOTE — Progress Notes (Signed)
ANTICOAGULATION CONSULT NOTE - Follow Up Consult  Pharmacy Consult for coumadin Indication: atrial fibrillation  Allergies  Allergen Reactions  . Lisinopril Swelling    Tongue swelling    Patient Measurements: Height: 5\' 11"  (180.3 cm) Weight: 255 lb 15.3 oz (116.1 kg) IBW/kg (Calculated) : 75.3   Vital Signs: Temp: 98.9 F (37.2 C) (10/24 1139) Temp Source: Oral (10/24 1139) BP: 135/68 mmHg (10/24 1139)  Labs:  Recent Labs  09/12/15 1501 09/12/15 1606 09/12/15 2057 09/13/15 0430  HGB 13.8  --  10.7* 10.0*  HCT 39.5  --  30.9* 29.1*  PLT 199  --  178 176  LABPROT  --  48.2*  --  51.8*  INR  --  5.33*  --  6.03*  CREATININE  --  1.56* 1.35* 1.24  CKTOTAL  --  129  --   --   TROPONINI  --  0.03 0.03 0.04*    Estimated Creatinine Clearance: 68.7 mL/min (by C-G formula based on Cr of 1.24).    Assessment: 74 yo M on coum PTA for abid.  Home coumadin dose unclear but takes 5 mg tablet and takes between 0 to 1 full tablet daily.  INR today is 6.03 Last dose was 2.5 mg on 09/11/15; on IV amio; per MD could have underlying cirrhosis/hepatitis- TRH to d/w cards if pt a good candidate for long term anticoag or need to change to NOAC. INR > goal.  CBC stable, no new bleeding reported.   Goal of Therapy:  INR 2-3   Plan:  - no coumadin today -daily INR - f/u cards recs for long-term anticoagution  Eudelia Bunch, Pharm.D. [phone] 09/13/2015 1:33 PM

## 2015-09-13 NOTE — Progress Notes (Signed)
Dr Sloan Leiter came by to see pt. Wants a 2nd peripheral line placed so that femoral line can be d/c'd. Will talk to cardiology about getting off IV amiodorone and start po today.

## 2015-09-13 NOTE — Progress Notes (Addendum)
PATIENT DETAILS Name: Edwin [name] Age: 74 y.o. Sex: male Date of Birth: 1941-04-11 Admit Date: 09/12/2015 Admitting Physician No admitting provider for patient encounter. WGN:FAOZHY,QMVHQ E, MD   Brief narrative:  74 year old alcoholic male-history of atrial fibrillation on Coumadin, chronic diastolic heart failure presented to the hospital after a mechanical fall vs Syncope at home. While in the emergency room, he became unresponsive for a few seconds-during this time ventricular tachycardia was noted in the telemetry monitor. He spontaneously converted back to sinus rhythm and regained consciousness. He was also found to have a significantly elevated lactic acid and significant leukocytosis without any obvious source of infection. Cardiology was consulted-patient was started on a amiodarone infusion and admitted to the stepdown unit. Please see below for further details  Subjective: Denies any nausea, vomiting or diarrhea. Denies any cough. Lying comfortably in bed.  Assessment/Plan: Principal Problem: Syncope: Suspect secondary to V. tach. Await echocardiogram, continue amiodarone and await cardiology follow-up.    Active Problems: V. tach: See above-keep potassium more than 4 and magnesium more than 2. Repeat electrolytes in a.m.  Sepsis: Afebrile but significant leukocytosis. No foci of infection evident on exam-chest x-ray/UA negative for pneumonia/UTI. Could be from inflammation from alcoholic hepatitis, large laceration in the neck. Abdomen soft and nontender on exam. Neck is supple. Continue empiric vancomycin/Zosyn-await blood cultures. Follow clinically.  Alcoholic hepatitis: LFTs downtrending, follow.  Coagulopathy: Suspect multifactorial-from Coumadin, could have underlying hepatitis/cirrhosis contributing. Hold Coumadin, and let INR down trend. No evidence of bleeding.  AKI: Likely mild prerenal azotemia-resolved with IV fluids  Mild hyponatremia:  Resolving with IV fluids.  Alcohol abuse with withdrawal: Continue Ativan per protocol. Already tremulous this morning. Follow and closely monitor in step down. Last drink on 10/23. Will need more counseling when more stable  Left leg laceration: Sutured in the emergency room-will need removal of sutures and one week or so.  Atrial fibrillation: Currently rate controlled with amiodarone, chronic Coumadin at home.CHA2DS2-VASc score of at least 3. We need to discuss with cardiology whether patient a good candidate for long-term anticoagulation or need to change to NOAC  Essential hypertension: Hypotensive on initial presentation-blood pressure still soft-hold antihypertensives-follow  Type 2 diabetes: Continue to hold metformin-CBGs stable on SSI. Follow  GERD/Barrett's esophagus: Continue PPI  Dyslipidemia: Continue to hold statin until LFTs improve further  Other issues: Please additional peripheral and Attempt to remove left femoral TLC  Disposition: Remain inpatient-remain in step down  Antimicrobial agents  See below  Anti-infectives    Start     Dose/Rate Route Frequency Ordered Stop   09/13/15 1700  vancomycin (VANCOCIN) 1,250 mg in sodium chloride 0.9 % 250 mL IVPB     1,250 mg 166.7 mL/hr over 90 Minutes Intravenous Every 24 hours 09/12/15 2150     09/12/15 2200  piperacillin-tazobactam (ZOSYN) IVPB 3.375 g  Status:  Discontinued     3.375 g 100 mL/hr over 30 Minutes Intravenous 3 times per day 09/12/15 2141 09/12/15 2142   09/12/15 2200  piperacillin-tazobactam (ZOSYN) IVPB 3.375 g     3.375 g 12.5 mL/hr over 240 Minutes Intravenous Every 8 hours 09/12/15 2143     09/12/15 1600  vancomycin (VANCOCIN) 2,000 mg in sodium chloride 0.9 % 500 mL IVPB     2,000 mg 250 mL/hr over 120 Minutes Intravenous  Once 09/12/15 1545 09/12/15 1931   09/12/15 1600  piperacillin-tazobactam (ZOSYN) IVPB 3.375 g  3.375 g 100 mL/hr over 30 Minutes Intravenous  Once 09/12/15 1545 09/12/15  1650      DVT Prophylaxis: INR therapeutic  Code Status: Full code   Family Communication None at bedside  Procedures: None  CONSULTS:  cardiology  Time spent 30 minutes-Greater than 50% of this time was spent in counseling, explanation of diagnosis, planning of further management, and coordination of care.  MEDICATIONS: Scheduled Meds: . FLUoxetine  20 mg Oral Daily  . folic acid  1 mg Oral Daily  . insulin aspart  0-15 Units Subcutaneous TID WC  . LORazepam  0-4 mg Intravenous Q6H   Followed by  . [START ON 09/14/2015] LORazepam  0-4 mg Intravenous Q12H  . multivitamin with minerals  1 tablet Oral Daily  . pantoprazole  40 mg Oral Daily  . piperacillin-tazobactam (ZOSYN)  IV  3.375 g Intravenous Q8H  . potassium chloride SA  20 mEq Oral BID  . thiamine  100 mg Oral Daily   Or  . thiamine  100 mg Intravenous Daily  . vancomycin  1,250 mg Intravenous Q24H  . Warfarin - Pharmacist Dosing Inpatient   Does not apply q1800   Continuous Infusions: . 0.9 % NaCl with KCl 20 mEq / L 125 mL/hr at 09/12/15 2342  . amiodarone 30 mg/hr (09/13/15 0950)   PRN Meds:.LORazepam **OR** LORazepam, ondansetron **OR** ondansetron (ZOFRAN) IV    PHYSICAL EXAM: Vital signs in last 24 hours: Filed Vitals:   09/12/15 2342 09/13/15 0117 09/13/15 0600 09/13/15 0737  BP:  133/63 101/59 101/59  Pulse: 92 89    Temp:  98 F (36.7 C) 98 F (36.7 C) 97.8 F (36.6 C)  TempSrc:  Oral  Oral  Resp:  23    Height:      Weight:      SpO2:  96% 97% 96%    Weight change:  Filed Weights   09/12/15 1616 09/12/15 2032  Weight: 115.667 kg (255 lb) 116.1 kg (255 lb 15.3 oz)   Body mass index is 35.71 kg/(m^2).   Gen Exam: Awake and alert with clear speech. Mildly tremulous Neck: Supple, No JVD.   Chest: B/L Clear.   CVS: S1 S2 Regular Abdomen: soft, BS +, non tender, non distended.  Extremities: no edema, lower extremities warm to touch. Left leg wound-lateral aspect-sutured-with  dried blood Neurologic: Non Focal.   Skin: No Rash.   Wounds: N/A.   Intake/Output from previous day:  Intake/Output Summary (Last 24 hours) at 09/13/15 1013 Last data filed at 09/13/15 0700  Gross per 24 hour  Intake 1706.31 ml  Output     30 ml  Net 1676.31 ml     LAB RESULTS: CBC  Recent Labs Lab 09/12/15 1501 09/12/15 2057 09/13/15 0430  WBC 18.7* 28.4* 24.8*  HGB 13.8 10.7* 10.0*  HCT 39.5 30.9* 29.1*  PLT 199 178 176  MCV 91.9 91.7 92.1  MCH 32.1 31.8 31.6  MCHC 34.9 34.6 34.4  RDW 14.4 14.6 14.8  LYMPHSABS 0.9 1.1 0.7  MONOABS 1.0 1.4* 1.9*  EOSABS 0.0 0.0 0.0  BASOSABS 0.0 0.0 0.0    Chemistries   Recent Labs Lab 09/12/15 1559 09/12/15 1606 09/12/15 2057 09/13/15 0430  NA  --  131* 131* 133*  K  --  3.1* 4.4 4.3  CL  --  96* 94* 100*  CO2  --  16* 22 23  GLUCOSE  --  205* 208* 210*  BUN  --  10 11 13   CREATININE  --  1.56* 1.35* 1.24  CALCIUM  --  7.7* 7.8* 8.2*  MG 1.7  --   --   --     CBG:  Recent Labs Lab 09/12/15 2254 09/13/15 0742  GLUCAP 213* 186*    GFR Estimated Creatinine Clearance: 68.7 mL/min (by C-G formula based on Cr of 1.24).  Coagulation profile  Recent Labs Lab 09/12/15 1606 09/13/15 0430  INR 5.33* 6.03*    Cardiac Enzymes  Recent Labs Lab 09/12/15 1606 09/12/15 2057 09/13/15 0430  TROPONINI 0.03 0.03 0.04*    Invalid input(s): POCBNP No results for input(s): DDIMER in the last 72 hours. No results for input(s): HGBA1C in the last 72 hours. No results for input(s): CHOL, HDL, LDLCALC, TRIG, CHOLHDL, LDLDIRECT in the last 72 hours. No results for input(s): TSH, T4TOTAL, T3FREE, THYROIDAB in the last 72 hours.  Invalid input(s): FREET3 No results for input(s): VITAMINB12, FOLATE, FERRITIN, TIBC, IRON, RETICCTPCT in the last 72 hours. No results for input(s): LIPASE, AMYLASE in the last 72 hours.  Urine Studies No results for input(s): UHGB, CRYS in the last 72 hours.  Invalid input(s): UACOL,  UAPR, USPG, UPH, UTP, UGL, UKET, UBIL, UNIT, UROB, ULEU, UEPI, UWBC, URBC, UBAC, CAST, UCOM, BILUA  MICROBIOLOGY: Recent Results (from the past 240 hour(s))  MRSA PCR Screening     Status: None   Collection Time: 09/12/15  8:38 PM  Result Value Ref Range Status   MRSA by PCR NEGATIVE NEGATIVE Final    Comment:        The GeneXpert MRSA Assay (FDA approved for NASAL specimens only), is one component of a comprehensive MRSA colonization surveillance program. It is not intended to diagnose MRSA infection nor to guide or monitor treatment for MRSA infections.     RADIOLOGY STUDIES/RESULTS: Ct Head Wo Contrast  09/12/2015  CLINICAL DATA:  Recent fall, anticoagulated EXAM: CT HEAD WITHOUT CONTRAST TECHNIQUE: Contiguous axial images were obtained from the base of the skull through the vertex without intravenous contrast. COMPARISON:  None. FINDINGS: Bony calvarium is intact. Mucosal changes are noted within the left maxillary antrum of uncertain chronicity. Mild atrophic changes are noted commenced with the patient's given age. No findings to suggest acute hemorrhage, acute infarction or space-occupying mass lesion are noted. IMPRESSION: Mild atrophic changes without acute abnormality. Electronically Signed   By: Inez Catalina M.D.   On: 09/12/2015 18:45   Dg Chest Portable 1 View  09/12/2015  CLINICAL DATA:  Fall today and became unresponsive at hospital. EXAM: PORTABLE CHEST 1 VIEW COMPARISON:  07/26/2014 FINDINGS: Lungs are hypoinflated without focal consolidation, effusion or pneumothorax. Note that the lateral left lung base is cut off the film. Remainder of the exam is unchanged. IMPRESSION: Hypoinflation without acute cardiopulmonary disease. Electronically Signed   By: Marin Olp M.D.   On: 09/12/2015 15:05    Oren Binet, MD  [facility] Pager:[phone]  If 7PM-7AM, please contact night-coverage [email or web address] Password [email or web address] 09/13/2015, 10:13 AM   LOS: 1 day

## 2015-09-13 NOTE — Progress Notes (Signed)
Utilization Review Completed.  

## 2015-09-14 DIAGNOSIS — E785 Hyperlipidemia, unspecified: Secondary | ICD-10-CM

## 2015-09-14 DIAGNOSIS — E876 Hypokalemia: Secondary | ICD-10-CM

## 2015-09-14 LAB — CBC WITH DIFFERENTIAL/PLATELET
Basophils Absolute: 0 10*3/uL (ref 0.0–0.1)
Basophils Relative: 0 %
EOS ABS: 0 10*3/uL (ref 0.0–0.7)
EOS PCT: 0 %
HCT: 25.8 % — ABNORMAL LOW (ref 39.0–52.0)
Hemoglobin: 8.8 g/dL — ABNORMAL LOW (ref 13.0–17.0)
LYMPHS ABS: 1 10*3/uL (ref 0.7–4.0)
LYMPHS PCT: 6 %
MCH: 31.7 pg (ref 26.0–34.0)
MCHC: 34.1 g/dL (ref 30.0–36.0)
MCV: 92.8 fL (ref 78.0–100.0)
MONO ABS: 1.4 10*3/uL — AB (ref 0.1–1.0)
MONOS PCT: 8 %
Neutro Abs: 14.2 10*3/uL — ABNORMAL HIGH (ref 1.7–7.7)
Neutrophils Relative %: 86 %
PLATELETS: 136 10*3/uL — AB (ref 150–400)
RBC: 2.78 MIL/uL — ABNORMAL LOW (ref 4.22–5.81)
RDW: 14.9 % (ref 11.5–15.5)
WBC: 16.6 10*3/uL — AB (ref 4.0–10.5)

## 2015-09-14 LAB — COMPREHENSIVE METABOLIC PANEL
ALBUMIN: 2.7 g/dL — AB (ref 3.5–5.0)
ALT: 68 U/L — AB (ref 17–63)
AST: 71 U/L — AB (ref 15–41)
Alkaline Phosphatase: 56 U/L (ref 38–126)
Anion gap: 6 (ref 5–15)
BUN: 10 mg/dL (ref 6–20)
CHLORIDE: 101 mmol/L (ref 101–111)
CO2: 25 mmol/L (ref 22–32)
CREATININE: 0.92 mg/dL (ref 0.61–1.24)
Calcium: 8.4 mg/dL — ABNORMAL LOW (ref 8.9–10.3)
GFR calc Af Amer: >60 mL/min (ref >60)
GLUCOSE: 142 mg/dL — AB (ref 65–99)
Potassium: 3.8 mmol/L (ref 3.5–5.1)
Sodium: 132 mmol/L — ABNORMAL LOW (ref 135–145)
Total Bilirubin: 2 mg/dL — ABNORMAL HIGH (ref 0.3–1.2)
Total Protein: 5.5 g/dL — ABNORMAL LOW (ref 6.5–8.1)

## 2015-09-14 LAB — HEMOGLOBIN A1C
Hgb A1c MFr Bld: 5.6 % (ref 4.8–5.6)
MEAN PLASMA GLUCOSE: 114 mg/dL

## 2015-09-14 LAB — PROTIME-INR
INR: 2.58 — ABNORMAL HIGH (ref 0.00–1.49)
PROTHROMBIN TIME: 27.3 s — AB (ref 11.6–15.2)

## 2015-09-14 LAB — GLUCOSE, CAPILLARY
GLUCOSE-CAPILLARY: 134 mg/dL — AB (ref 65–99)
Glucose-Capillary: 169 mg/dL — ABNORMAL HIGH (ref 65–99)
Glucose-Capillary: 142 mg/dL — ABNORMAL HIGH (ref 65–99)
Glucose-Capillary: 126 mg/dL — ABNORMAL HIGH (ref 65–99)

## 2015-09-14 LAB — LACTIC ACID, PLASMA: Lactic Acid, Venous: 1.3 mmol/L (ref 0.5–2.0)

## 2015-09-14 MED ORDER — ASPIRIN EC 325 MG PO TBEC
325.0000 mg | DELAYED_RELEASE_TABLET | Freq: Every day | ORAL | Status: DC
  Administered 2015-09-14 – 2015-09-16 (×3): 325 mg via ORAL
  Filled 2015-09-14 (×3): qty 1

## 2015-09-14 MED ORDER — METOPROLOL TARTRATE 50 MG PO TABS
50.0000 mg | ORAL_TABLET | Freq: Two times a day (BID) | ORAL | Status: DC
  Administered 2015-09-14 – 2015-09-16 (×3): 50 mg via ORAL
  Filled 2015-09-14: qty 1
  Filled 2015-09-14: qty 2
  Filled 2015-09-14 (×2): qty 1

## 2015-09-14 NOTE — Evaluation (Signed)
Physical Therapy Evaluation Patient Details Name: Edwin [name] MRN: [id number] DOB: 1941-03-23 Today's Date: 09/14/2015   History of Present Illness  74 y.o. male with a past medical history of chronic atrial fibrillation on warfarin, obstructive sleep apnea, Barrett's esophagus, hypertension, type 2 diabetes, morbid obesity, hyperlipidemia, alcohol dependence, anxiety who was brought to the emergency department via EMS after having a fall at home.  Clinical Impression  Patient demonstrates deficits in functional mobility as indicated below. Will need continued skilled PT to address deficits and maximize function. Will see as indicated and progress as tolerated.    Follow Up Recommendations SNF;Supervision/Assistance - 24 hour    Equipment Recommendations  Rolling walker with 5" wheels    Recommendations for Other Services       Precautions / Restrictions Precautions Precautions: Fall Precaution Comments: open wound LLE      Mobility  Bed Mobility Overal bed mobility: Needs Assistance Bed Mobility: Rolling;Sidelying to Sit Rolling: Min guard Sidelying to sit: Min guard;HOB elevated       General bed mobility comments: use of rails to come to EOB, increased time and effort to perform  Transfers Overall transfer level: Needs assistance Equipment used: Rolling walker (2 wheeled)   Sit to Stand: Mod assist Stand pivot transfers: +2 physical assistance;Mod assist       General transfer comment: Difficulty sequencing pivotal steps to chair, increased time, multi modal cues  Ambulation/Gait                Stairs            Wheelchair Mobility    Modified Rankin (Stroke Patients Only)       Balance Overall balance assessment: History of Falls;Needs assistance   Sitting balance-Leahy Scale: Fair     Standing balance support: During functional activity Standing balance-Leahy Scale: Poor                               Pertinent  Vitals/Pain Pain Assessment: Faces Faces Pain Scale: Hurts little more Pain Location: generalized Pain Descriptors / Indicators: Discomfort Pain Intervention(s): Limited activity within patient's tolerance    Home Living Family/patient expects to be discharged to:: Skilled nursing facility Living Arrangements: Alone   Type of Home: House Home Access: Stairs to enter   [employer] of Steps: 1 Home Layout: Two level Home Equipment: Walker - 2 wheels;Cane - single point Additional Comments: patient is poor historian    Prior Function Level of Independence: Independent         Comments: having more difficulty     Hand Dominance        Extremity/Trunk Assessment   Upper Extremity Assessment: Generalized weakness           Lower Extremity Assessment: Generalized weakness   LLE Deficits / Details: LLE leg wound  Cervical / Trunk Assessment:  (increased body habitus')  Communication   Communication: No difficulties  Cognition Arousal/Alertness: Awake/alert Behavior During Therapy: Flat affect Overall Cognitive Status: Impaired/Different from baseline Area of Impairment: Orientation;Attention;Memory;Following commands;Safety/judgement;Awareness;Problem solving Orientation Level: Disoriented to;Place;Time;Situation Current Attention Level: Sustained Memory: Decreased short-term memory Following Commands: Follows one step commands consistently;Follows one step commands with increased time Safety/Judgement: Decreased awareness of safety;Decreased awareness of deficits Awareness: Intellectual Problem Solving: Slow processing;Decreased initiation;Difficulty sequencing;Requires verbal cues;Requires tactile cues      General Comments      Exercises        Assessment/Plan    PT Assessment  Patient needs continued PT services  PT Diagnosis Difficulty walking;Abnormality of gait;Generalized weakness;Acute pain;Altered mental status   PT Problem List  Decreased strength;Decreased activity tolerance;Decreased cognition;Decreased coordination;Decreased mobility;Decreased balance;Decreased safety awareness;Obesity;Decreased skin integrity;Pain  PT Treatment Interventions DME instruction;Gait training;Stair training;Functional mobility training;Therapeutic activities;Therapeutic exercise;Balance training;Cognitive remediation;Patient/family education   PT Goals (Current goals can be found in the Care Plan section) Acute Rehab PT Goals Patient Stated Goal: none stated PT Goal Formulation: Patient unable to participate in goal setting Time For Goal Achievement: 09/28/15 Potential to Achieve Goals: Fair    Frequency Min 2X/week   Barriers to discharge Decreased caregiver support      Co-evaluation               End of Session                 Time: 0370-9643 PT Time Calculation (min) (ACUTE ONLY): 22 min   Charges:    1 PT Evaluation     PT G CodesDuncan Dull 02-Oct-2015, 2:05 PM  Alben Deeds, [place] DPT  [phone]

## 2015-09-14 NOTE — Progress Notes (Signed)
PATIENT DETAILS Name: Edwin [name] Age: 74 y.o. Sex: male Date of Birth: 1941/02/01 Admit Date: 09/12/2015 Admitting Physician No admitting provider for patient encounter. ZHG:[id number],STMHD E, MD   Brief narrative:  74 year old alcoholic male-history of atrial fibrillation on Coumadin, chronic diastolic heart failure presented to the hospital after a mechanical fall vs Syncope at home. While in the emergency room, he became unresponsive for a few seconds-during this time ventricular tachycardia was noted in the telemetry monitor. He spontaneously converted back to sinus rhythm and regained consciousness. He was also found to have a significantly elevated lactic acid and significant leukocytosis without any obvious source of infection. Cardiology was consulted-patient was started on a amiodarone infusion and admitted to the stepdown unit. Please see below for further details  Subjective: Still very tremulous-has no intention of quitting ETOH use  Assessment/Plan: Principal Problem: Syncope: Suspect secondary to V. tach. Echocardiogram demonstrates normal LV function, initially started on IV amiodarone-now transitioned to oral amiodarone-very poor long-term candidate for amiodarone given alcohol abuse/hepatitis.    Active Problems: V. tach: Likely secondary to metabolic abnormalities-managed with IV amiodarone-as noted above-poor long-term candidate for oral amiodarone given alcoholic hepatitis/alcohol abuse-patient has no intention of quitting alcohol. Spoke with Dr. Gardiner Fanti recommends discontinuing amiodarone and monitoring.  Sepsis: Afebrile but with significant leukocytosis on admission but is now down trending. No foci of infection evident on exam-chest x-ray/UA negative for pneumonia/UTI. Blood cultures negative so far. Leukocytosis could be  be from inflammation from alcoholic hepatitis, large laceration in the left leg. Continue empiric vancomycin and Zosyn for another  day before narrowing antibiotic spectrum.  Alcoholic hepatitis: LFTs downtrending, follow.  Coagulopathy: Suspect multifactorial-from Coumadin, could have underlying hepatitis/cirrhosis contributing. Coumadin held on admission-INR down trending slowly.   AKI: Likely mild prerenal azotemia-resolved with IV fluids  Mild hyponatremia: Resolving with IV fluids.  Alcohol abuse with withdrawal: Continue Ativan per protocol. Continues to be tremulous-but awake and alert. Follow and closely monitor in step down. Last drink on 10/23. Had discussion with patient this morning-he has no intention of quitting alcohol.  Left leg laceration: Sutured in the emergency room-will need removal of sutures and one week or so. Wound care evaluation while inpatient  Atrial fibrillation: Currently rate controlled with amiodarone, chronic Coumadin at home.CHA2DS2-VASc score of at least 3. Upon further chart review-patient had a GI bleed in September 2015-now with a fall causing a large left leg laceration-INR was supratherapeutic on admission. Very poor candidate to continue long-term anticoagulation-as he continues to drink alcohol and has no intention of quitting .spoke with Dr. Romilda Garret both at this time recommend discontinuing anticoagulation-start aspirin-and reconsider starting anticoagulation in the outpatient setting once he is more committed to stopping alcohol use.   Essential hypertension: Hypotensive on initial presentation-blood pressure now creeping up-start metoprolol and follow.  Type 2 diabetes: Continue to hold metformin-CBGs stable on SSI. Follow  GERD/Barrett's esophagus: Continue PPI  Dyslipidemia: Continue to hold statin until LFTs improve further  Other issues: Removed left femoral TLC on 10/24, PT evaluation  Disposition: Remain inpatient-remain in step down-for another 24 hours-still at high risk of DTs.  Antimicrobial agents  See below  Anti-infectives    Start     Dose/Rate Route  Frequency Ordered Stop   09/13/15 1700  vancomycin (VANCOCIN) 1,250 mg in sodium chloride 0.9 % 250 mL IVPB     1,250 mg 166.7 mL/hr over 90 Minutes Intravenous Every 24 hours 09/12/15 2150  09/12/15 2200  piperacillin-tazobactam (ZOSYN) IVPB 3.375 g  Status:  Discontinued     3.375 g 100 mL/hr over 30 Minutes Intravenous 3 times per day 09/12/15 2141 09/12/15 2142   09/12/15 2200  piperacillin-tazobactam (ZOSYN) IVPB 3.375 g     3.375 g 12.5 mL/hr over 240 Minutes Intravenous Every 8 hours 09/12/15 2143     09/12/15 1600  vancomycin (VANCOCIN) 2,000 mg in sodium chloride 0.9 % 500 mL IVPB     2,000 mg 250 mL/hr over 120 Minutes Intravenous  Once 09/12/15 1545 09/12/15 1931   09/12/15 1600  piperacillin-tazobactam (ZOSYN) IVPB 3.375 g     3.375 g 100 mL/hr over 30 Minutes Intravenous  Once 09/12/15 1545 09/12/15 1650      DVT Prophylaxis: INR therapeutic  Code Status: Full code   Family Communication None at bedside  Procedures: None  CONSULTS:  cardiology  Time spent 30 minutes-Greater than 50% of this time was spent in counseling, explanation of diagnosis, planning of further management, and coordination of care.  MEDICATIONS: Scheduled Meds: . amiodarone  400 mg Oral BID  . FLUoxetine  20 mg Oral Daily  . folic acid  1 mg Oral Daily  . insulin aspart  0-15 Units Subcutaneous TID WC  . LORazepam  0-4 mg Intravenous Q6H   Followed by  . LORazepam  0-4 mg Intravenous Q12H  . multivitamin with minerals  1 tablet Oral Daily  . pantoprazole  40 mg Oral Daily  . piperacillin-tazobactam (ZOSYN)  IV  3.375 g Intravenous Q8H  . potassium chloride SA  20 mEq Oral BID  . thiamine  100 mg Oral Daily  . vancomycin  1,250 mg Intravenous Q24H  . Warfarin - Pharmacist Dosing Inpatient   Does not apply q1800   Continuous Infusions: . 0.9 % NaCl with KCl 20 mEq / L 75 mL/hr at 09/13/15 2203   PRN Meds:.LORazepam **OR** LORazepam, ondansetron **OR** ondansetron (ZOFRAN)  IV    PHYSICAL EXAM: Vital signs in last 24 hours: Filed Vitals:   09/14/15 0000 09/14/15 0200 09/14/15 0400 09/14/15 0737  BP: 129/68 135/73 149/61 156/89  Pulse: 84 85 78   Temp: 98.3 F (36.8 C)  98.2 F (36.8 C) 97.6 F (36.4 C)  TempSrc: Oral  Oral Axillary  Resp: 24 20 22    Height:      Weight:      SpO2: 98% 99% 99% 96%    Weight change:  Filed Weights   09/12/15 1616 09/12/15 2032  Weight: 115.667 kg (255 lb) 116.1 kg (255 lb 15.3 oz)   Body mass index is 35.71 kg/(m^2).   Gen Exam: Awake and alert with clear speech. Mildly tremulous Neck: Supple, No JVD.   Chest: B/L Clear.   CVS: S1 S2 Regular Abdomen: soft, BS +, non tender, non distended.  Extremities: no edema, lower extremities warm to touch. Left leg wound-lateral aspect-sutured-with dried blood Neurologic: Non Focal.   Skin: No Rash.   Wounds: N/A.   Intake/Output from previous day:  Intake/Output Summary (Last 24 hours) at 09/14/15 0939 Last data filed at 09/14/15 0700  Gross per 24 hour  Intake 3158.5 ml  Output    425 ml  Net 2733.5 ml     LAB RESULTS: CBC  Recent Labs Lab 09/12/15 1501 09/12/15 2057 09/13/15 0430 09/14/15 0345  WBC 18.7* 28.4* 24.8* 16.6*  HGB 13.8 10.7* 10.0* 8.8*  HCT 39.5 30.9* 29.1* 25.8*  PLT 199 178 176 136*  MCV 91.9 91.7 92.1 92.8  MCH 32.1 31.8 31.6 31.7  MCHC 34.9 34.6 34.4 34.1  RDW 14.4 14.6 14.8 14.9  LYMPHSABS 0.9 1.1 0.7 1.0  MONOABS 1.0 1.4* 1.9* 1.4*  EOSABS 0.0 0.0 0.0 0.0  BASOSABS 0.0 0.0 0.0 0.0    Chemistries   Recent Labs Lab 09/12/15 1559 09/12/15 1606 09/12/15 2057 09/13/15 0430 09/14/15 0345  NA  --  131* 131* 133* 132*  K  --  3.1* 4.4 4.3 3.8  CL  --  96* 94* 100* 101  CO2  --  16* 22 23 25   GLUCOSE  --  205* 208* 210* 142*  BUN  --  10 11 13 10   CREATININE  --  1.56* 1.35* 1.24 0.92  CALCIUM  --  7.7* 7.8* 8.2* 8.4*  MG 1.7  --   --   --   --     CBG:  Recent Labs Lab 09/13/15 0742 09/13/15 1138  09/13/15 1726 09/13/15 2205 09/14/15 0734  GLUCAP 186* 165* 142* 133* 169*    GFR Estimated Creatinine Clearance: 92.7 mL/min (by C-G formula based on Cr of 0.92).  Coagulation profile  Recent Labs Lab 09/12/15 1606 09/13/15 0430 09/14/15 0345  INR 5.33* 6.03* 2.58*    Cardiac Enzymes  Recent Labs Lab 09/12/15 1606 09/12/15 2057 09/13/15 0430  TROPONINI 0.03 0.03 0.04*    Invalid input(s): POCBNP No results for input(s): DDIMER in the last 72 hours.  Recent Labs  09/12/15 2057  HGBA1C 5.6   No results for input(s): CHOL, HDL, LDLCALC, TRIG, CHOLHDL, LDLDIRECT in the last 72 hours. No results for input(s): TSH, T4TOTAL, T3FREE, THYROIDAB in the last 72 hours.  Invalid input(s): FREET3 No results for input(s): VITAMINB12, FOLATE, FERRITIN, TIBC, IRON, RETICCTPCT in the last 72 hours. No results for input(s): LIPASE, AMYLASE in the last 72 hours.  Urine Studies No results for input(s): UHGB, CRYS in the last 72 hours.  Invalid input(s): UACOL, UAPR, USPG, UPH, UTP, UGL, UKET, UBIL, UNIT, UROB, ULEU, UEPI, UWBC, URBC, UBAC, CAST, UCOM, BILUA  MICROBIOLOGY: Recent Results (from the past 240 hour(s))  Blood culture (routine x 2)     Status: None (Preliminary result)   Collection Time: 09/12/15  3:55 PM  Result Value Ref Range Status   Specimen Description BLOOD CENTRAL LINE  Final   Special Requests BOTTLES DRAWN AEROBIC ONLY 5CC  Final   Culture NO GROWTH < 24 HOURS  Final   Report Status PENDING  Incomplete  Blood culture (routine x 2)     Status: None (Preliminary result)   Collection Time: 09/12/15  3:59 PM  Result Value Ref Range Status   Specimen Description BLOOD LEFT HAND  Final   Special Requests IN PEDIATRIC BOTTLE 2CC  Final   Culture NO GROWTH < 24 HOURS  Final   Report Status PENDING  Incomplete  MRSA PCR Screening     Status: None   Collection Time: 09/12/15  8:38 PM  Result Value Ref Range Status   MRSA by PCR NEGATIVE NEGATIVE Final     Comment:        The GeneXpert MRSA Assay (FDA approved for NASAL specimens only), is one component of a comprehensive MRSA colonization surveillance program. It is not intended to diagnose MRSA infection nor to guide or monitor treatment for MRSA infections.     RADIOLOGY STUDIES/RESULTS: Ct Head Wo Contrast  09/12/2015  CLINICAL DATA:  Recent fall, anticoagulated EXAM: CT HEAD WITHOUT CONTRAST TECHNIQUE: Contiguous axial images were obtained from the base  of the skull through the vertex without intravenous contrast. COMPARISON:  None. FINDINGS: Bony calvarium is intact. Mucosal changes are noted within the left maxillary antrum of uncertain chronicity. Mild atrophic changes are noted commenced with the patient's given age. No findings to suggest acute hemorrhage, acute infarction or space-occupying mass lesion are noted. IMPRESSION: Mild atrophic changes without acute abnormality. Electronically Signed   By: Inez Catalina M.D.   On: 09/12/2015 18:45   Dg Chest Portable 1 View  09/12/2015  CLINICAL DATA:  Fall today and became unresponsive at hospital. EXAM: PORTABLE CHEST 1 VIEW COMPARISON:  07/26/2014 FINDINGS: Lungs are hypoinflated without focal consolidation, effusion or pneumothorax. Note that the lateral left lung base is cut off the film. Remainder of the exam is unchanged. IMPRESSION: Hypoinflation without acute cardiopulmonary disease. Electronically Signed   By: Marin Olp M.D.   On: 09/12/2015 15:05    Oren Binet, MD  [facility] Pager:[phone]  If 7PM-7AM, please contact night-coverage [email or web address] Password [email or web address] 09/14/2015, 9:39 AM   LOS: 2 days

## 2015-09-14 NOTE — Progress Notes (Signed)
Occupational Therapy Evaluation Patient Details Name: Edwin [name] MRN: [id number] DOB: 17-Jul-1941 Today's Date: 09/14/2015    History of Present Illness 74 y.o. male with a past medical history of chronic atrial fibrillation on warfarin, obstructive sleep apnea, Barrett's esophagus, hypertension, type 2 diabetes, morbid obesity, hyperlipidemia, alcohol dependence, anxiety who was brought to the emergency department via EMS after having a fall at home.   Clinical Impression   PTA, pt apparently lived alone and was independent with ADL and mobility. Pt apparently confused confabulatory during session. Currently requires mod A +2 for safe bed - chair transfer. Unable to ambulate at this time. Max A for LB ADL. Pt unsafe to D/C home and will need rehab at SNF to maximize functional level of independence with ADL and mobility.     Follow Up Recommendations  SNF;Supervision/Assistance - 24 hour    Equipment Recommendations  Other (comment) (TBA)    Recommendations for Other Services       Precautions / Restrictions Precautions Precautions: Fall Precaution Comments: open wound LLE      Mobility Bed Mobility Overal bed mobility: Needs Assistance Bed Mobility: Rolling;Sidelying to Sit Rolling: Min guard Sidelying to sit: Min guard;HOB elevated       General bed mobility comments: use of rails to come to EOB, increased time and effort to perform; increased HOB  Transfers Overall transfer level: Needs assistance Equipment used: Rolling walker (2 wheeled) Transfers: Sit to/from [facility] Sit to Stand: Mod assist Stand pivot transfers: +2 physical assistance;Mod assist       General transfer comment: Difficulty sequencing pivotal steps to chair, increased time, poor safety awareness during transfer    Balance Overall balance assessment: History of Falls;Needs assistance   Sitting balance-Leahy Scale: Fair     Standing balance support: During functional  activity Standing balance-Leahy Scale: Poor                              ADL Overall ADL's : Needs assistance/impaired Eating/Feeding: Set up   Grooming: Set up;Supervision/safety;Sitting   Upper Body Bathing: Supervision/ safety;Set up;Sitting   Lower Body Bathing: Maximal assistance;Sit to/from stand   Upper Body Dressing : Minimal assistance;Sitting   Lower Body Dressing: Maximal assistance;Sit to/from stand   Toilet Transfer: +2 for physical assistance;Moderate assistance;RW   Toileting- Clothing Manipulation and Hygiene: Maximal assistance       Functional mobility during ADLs: +2 for physical assistance;Moderate assistance General ADL Comments: Pt with significant physical impairment in addition to apparent confusion. Pt unable to safely care for himself at this time.      Vision Additional Comments: not assessed   Perception     Praxis      Pertinent Vitals/Pain Pain Assessment: Faces Faces Pain Scale: Hurts little more Pain Location: generalized Pain Descriptors / Indicators: Discomfort Pain Intervention(s): Limited activity within patient's tolerance  VSS     Hand Dominance     Extremity/Trunk Assessment Upper Extremity Assessment Upper Extremity Assessment: Generalized weakness   Lower Extremity Assessment Lower Extremity Assessment: Generalized weakness   Cervical / Trunk Assessment Cervical / Trunk Assessment: Normal   Communication Communication Communication: No difficulties   Cognition Arousal/Alertness: Awake/alert Behavior During Therapy: Flat affect Overall Cognitive Status: Impaired/Different from baseline Area of Impairment: Orientation;Attention;Memory;Following commands;Safety/judgement;Awareness;Problem solving Orientation Level: Disoriented to;Place;Time;Situation Current Attention Level: Sustained Memory: Decreased short-term memory Following Commands: Follows one step commands consistently;Follows one step  commands with increased time Safety/Judgement: Decreased awareness of safety;Decreased  awareness of deficits Awareness: Intellectual Problem Solving: Slow processing;Decreased initiation;Difficulty sequencing;Requires verbal cues;Requires tactile cues     General Comments       Exercises       Shoulder Instructions      Home Living Family/patient expects to be discharged to:: Skilled nursing facility Living Arrangements: Alone   Type of Home: House Home Access: Stairs to enter Entrance Stairs-Number of Steps: 1   Home Layout: Two level               Home Equipment: Walker - 2 wheels;Cane - single point   Additional Comments: patient is poor historian      Prior Functioning/Environment Level of Independence: Independent        Comments: having more difficulty    OT Diagnosis: Generalized weakness;Cognitive deficits   OT Problem List: Decreased strength;Decreased activity tolerance;Decreased range of motion;Impaired balance (sitting and/or standing);Decreased cognition;Decreased safety awareness;Decreased knowledge of use of DME or AE;Cardiopulmonary status limiting activity;Obesity;Increased edema   OT Treatment/Interventions: Self-care/ADL training;Therapeutic exercise;DME and/or AE instruction;Therapeutic activities;Cognitive remediation/compensation;Patient/family education;Balance training    OT Goals(Current goals can be found in the care plan section) Acute Rehab OT Goals Patient Stated Goal: none stated OT Goal Formulation: Patient unable to participate in goal setting Time For Goal Achievement: 09/28/15 Potential to Achieve Goals: Good ADL Goals Pt Will Perform Upper Body Bathing: with set-up;sitting Pt Will Perform Lower Body Bathing: with min assist;with adaptive equipment;sit to/from stand Pt Will Perform Upper Body Dressing: with set-up;with supervision;sitting Pt Will Transfer to Toilet: with min assist;bedside commode Pt Will Perform Toileting -  Clothing Manipulation and hygiene: with min assist;sit to/from stand Additional ADL Goal #1: Pt will demonstrate emergent awreness durin gADL task in nondistracting environment  OT Frequency: Min 2X/week   Barriers to D/C: Other (comment) (unsure of caregiver support)          Co-evaluation              Edwin [name], OTR/L  [phone] 2016-11-06End of Session Equipment Utilized During Treatment: Gait belt;Rolling walker Nurse Communication: Mobility status; recommend use of Wide Stedy to assist with transfers  Activity Tolerance: Patient tolerated treatment well Patient left: in chair;with call bell/phone within reach;with chair alarm set   Time: 6553-7482 OT Time Calculation (min): 31 min Charges:  OT General Charges $OT Visit: 1 Procedure G-Codes:    Edwin [name],Edwin [name] 09-26-2015, 1:54 PM

## 2015-09-14 NOTE — Progress Notes (Signed)
Subjective:  Pt resting comfortably in bed. Reports left leg pain from laceration and bilateral leg weakness. Denies any chest pain, SOB, palpitations, or dizziness.    Objective:  Vital Signs in the last 24 hours: Temp:  [97.6 F (36.4 C)-98.9 F (37.2 C)] 97.6 F (36.4 C) (10/25 0737) Pulse Rate:  [78-87] 78 (10/25 0400) Resp:  [18-24] 22 (10/25 0400) BP: (129-156)/(61-89) 156/89 mmHg (10/25 0737) SpO2:  [92 %-99 %] 96 % (10/25 0737)  Intake/Output from previous day: 10/24 0701 - 10/25 0700 In: 3316.9 [P.O.:700; I.V.:2216.9; IV Piggyback:400] Out: 425 [Urine:425]  Physical Exam: General appearance: alert, cooperative, fatigued and no distress Neck: no adenopathy, no carotid bruit, no JVD, supple, symmetrical, trachea midline and thyroid not enlarged, symmetric, no tenderness/mass/nodules Lungs: clear to auscultation bilaterally Chest wall: no tenderness Heart: S1: variable and irregular, S2: normal and no S3 or S4 Extremities: edema 1-2+ bilaterally, venous stasis dermatitis noted and left lower leg laceration Pulses: 2+ and symmetric  Lab Results: BMP  Recent Labs  09/12/15 2057 09/13/15 0430 09/14/15 0345  NA 131* 133* 132*  K 4.4 4.3 3.8  CL 94* 100* 101  CO2 22 23 25   GLUCOSE 208* 210* 142*  BUN 11 13 10   CREATININE 1.35* 1.24 0.92  CALCIUM 7.8* 8.2* 8.4*  GFRNONAA 50* 56* >60  GFRAA 59* >60 >60    CBC  Recent Labs Lab 09/14/15 0345  WBC 16.6*  RBC 2.78*  HGB 8.8*  HCT 25.8*  PLT 136*  MCV 92.8  MCH 31.7  MCHC 34.1  RDW 14.9  LYMPHSABS 1.0  MONOABS 1.4*  EOSABS 0.0  BASOSABS 0.0    HEMOGLOBIN A1C Lab Results  Component Value Date   HGBA1C 5.6 09/12/2015   MPG 114 09/12/2015    Cardiac Panel (last 3 results)  Recent Labs  09/12/15 1606 09/12/15 2057 09/13/15 0430  CKTOTAL 129  --   --   TROPONINI 0.03 0.03 0.04*    BNP (last 3 results) No results for input(s): PROBNP in the last 8760 hours.  TSH No results for input(s):  TSH in the last 8760 hours.  CHOLESTEROL No results for input(s): CHOL in the last 8760 hours.  Hepatic Function Panel  Recent Labs  09/12/15 1606 09/13/15 0430 09/14/15 0345  PROT 5.9* 5.7* 5.5*  ALBUMIN 2.8* 2.8* 2.7*  AST 89* 118* 71*  ALT 63 79* 68*  ALKPHOS 67 64 56  BILITOT 2.3* 2.1* 2.0*    Imaging: Ct Head Wo Contrast  09/12/2015  CLINICAL DATA:  Recent fall, anticoagulated EXAM: CT HEAD WITHOUT CONTRAST TECHNIQUE: Contiguous axial images were obtained from the base of the skull through the vertex without intravenous contrast. COMPARISON:  None. FINDINGS: Bony calvarium is intact. Mucosal changes are noted within the left maxillary antrum of uncertain chronicity. Mild atrophic changes are noted commenced with the patient's given age. No findings to suggest acute hemorrhage, acute infarction or space-occupying mass lesion are noted. IMPRESSION: Mild atrophic changes without acute abnormality. Electronically Signed   By: Inez Catalina M.D.   On: 09/12/2015 18:45   Dg Chest Portable 1 View  09/12/2015  CLINICAL DATA:  Fall today and became unresponsive at hospital. EXAM: PORTABLE CHEST 1 VIEW COMPARISON:  07/26/2014 FINDINGS: Lungs are hypoinflated without focal consolidation, effusion or pneumothorax. Note that the lateral left lung base is cut off the film. Remainder of the exam is unchanged. IMPRESSION: Hypoinflation without acute cardiopulmonary disease. Electronically Signed   By: Marin Olp M.D.   On: 09/12/2015  15:05    Cardiac Studies: EKG 09/12/2015: atrial fibrillation with controlled VR at a rate of 80bpm, normal axis, IRBBB, frequent PVCs, non-specific ST abnormality in inferior leads  Echocardiogram 09/13/2015:  - Left ventricle: The cavity size was normal. Systolic function was normal. The estimated ejection fraction was in the range of 60% to 65%. Wall motion was normal; there were no regional wall motion abnormalities. - Right ventricle: Poorly  visualized.  Assessment/Plan:  1. ?Syncope vs mechanical fall 2. Frequent episodes of NSVT and sustained VT with witnessed loss of consciousness in ED 3. Alcohol abuse 4. Benign essential hypertension 5. Type 2 Diabetes Mellitus 6. Hyperlipidemia 7. Chronic diastolic heart failure 8. Chronic a.fib (CHA2DS2-VASCScore: Risk Score 3,  Yearly risk of stroke 3.2%. [place] Has Bled: Score 4.  Estimated risk of major bleeding at 1 year with [place] 4.9-19.6%): Hold anticoagulation for now as INR remains supratherapeutic, high bleed risk d/t alcohol abuse and abnormal LFTs. 9. Anemia secondary to acute blood loss from left lower leg laceration with supratherapeutic INR  Recommendation: Echocardiogram normal without evidence of cardiomyopathy. Suspect metabolic etiology.  No EKG changes or change in cardiac markers, low suspicion for ischemic etiology of VT.  Will consider stress testing on an outpatient basis when medically stable.  Can d/c amiodarone and monitor as electrolyte abnormalities are being corrected.    Rachel Bo, NP-C 09/14/2015, 7:42 AM [facility] Pager: [phone] Office: [phone]

## 2015-09-15 DIAGNOSIS — E118 Type 2 diabetes mellitus with unspecified complications: Secondary | ICD-10-CM

## 2015-09-15 LAB — GLUCOSE, CAPILLARY
GLUCOSE-CAPILLARY: 147 mg/dL — AB (ref 65–99)
GLUCOSE-CAPILLARY: 105 mg/dL — AB (ref 65–99)
GLUCOSE-CAPILLARY: 101 mg/dL — AB (ref 65–99)
Glucose-Capillary: 114 mg/dL — ABNORMAL HIGH (ref 65–99)
Glucose-Capillary: 124 mg/dL — ABNORMAL HIGH (ref 65–99)

## 2015-09-15 LAB — CBC
HEMATOCRIT: 25.5 % — AB (ref 39.0–52.0)
HEMOGLOBIN: 8.6 g/dL — AB (ref 13.0–17.0)
MCH: 32.3 pg (ref 26.0–34.0)
MCHC: 33.7 g/dL (ref 30.0–36.0)
MCV: 95.9 fL (ref 78.0–100.0)
Platelets: 162 10*3/uL (ref 150–400)
RBC: 2.66 MIL/uL — AB (ref 4.22–5.81)
RDW: 15 % (ref 11.5–15.5)
WBC: 15.6 10*3/uL — ABNORMAL HIGH (ref 4.0–10.5)

## 2015-09-15 LAB — COMPREHENSIVE METABOLIC PANEL
ALBUMIN: 2.7 g/dL — AB (ref 3.5–5.0)
ALK PHOS: 56 U/L (ref 38–126)
ALT: 61 U/L (ref 17–63)
AST: 53 U/L — ABNORMAL HIGH (ref 15–41)
Anion gap: 6 (ref 5–15)
BILIRUBIN TOTAL: 1.8 mg/dL — AB (ref 0.3–1.2)
BUN: 9 mg/dL (ref 6–20)
CHLORIDE: 102 mmol/L (ref 101–111)
CO2: 25 mmol/L (ref 22–32)
Calcium: 8.6 mg/dL — ABNORMAL LOW (ref 8.9–10.3)
Creatinine, Ser: 1.03 mg/dL (ref 0.61–1.24)
GFR calc Af Amer: >60 mL/min (ref >60)
GFR calc non Af Amer: >60 mL/min (ref >60)
Glucose, Bld: 146 mg/dL — ABNORMAL HIGH (ref 65–99)
POTASSIUM: 3.8 mmol/L (ref 3.5–5.1)
Sodium: 133 mmol/L — ABNORMAL LOW (ref 135–145)
Total Protein: 5.2 g/dL — ABNORMAL LOW (ref 6.5–8.1)

## 2015-09-15 LAB — MAGNESIUM: MAGNESIUM: 1.9 mg/dL (ref 1.7–2.4)

## 2015-09-15 MED ORDER — MAGNESIUM SULFATE 2 GM/50ML IV SOLN
2.0000 g | Freq: Once | INTRAVENOUS | Status: AC
  Administered 2015-09-15: 2 g via INTRAVENOUS
  Filled 2015-09-15: qty 50

## 2015-09-15 NOTE — Clinical Social Work Note (Signed)
Clinical Social Work Assessment  Patient Details  Name: Edwin [name] MRN: [id number] Date of Birth: 12-02-1940  Date of referral:  09/15/15               Reason for consult:  Facility Placement                Permission sought to share information with:  Family Supports Permission granted to share information::  No (pt disoriented)  Name::     Jay::  [facility]  Relationship::  son  Contact Information:     Housing/Transportation Living arrangements for the past 2 months:  Single Family Home Source of Information:  Patient Patient Interpreter Needed:  None Criminal Activity/Legal Involvement Pertinent to Current Situation/Hospitalization:  No - Comment as needed Significant Relationships:  Adult Children Lives with:  Self Do you feel safe going back to the place where you live?  No Need for family participation in patient care:  Yes (Comment) (decision making)  Care giving concerns:  Pt lives at home alone- pt son lives nearby but can not provide with 24 hour assistance   Facilities manager / plan:  CSW spoke with pt son concerning PT recommendation for SNF prior to return home  Employment status:  Retired Nurse, adult PT Recommendations:  [place] / Referral to community resources:  [place]  Patient/Family's Response to care:  Pt son is agreeable to short term SNF placement- states that pt drinking problem has gotten bad over past months since pts wife died- pt has not be able to care for himself- pt son would like for pt to go to short term rehab to get stronger and avoid drinking while he recovers  Patient/Family's Understanding of and Emotional Response to Diagnosis, Current Treatment, and Prognosis: Pt son seems to have clear understanding of condition and is hopeful that pt will get better soon  Emotional Assessment Appearance:  Appears stated  age Attitude/Demeanor/Rapport:  Unable to Assess Affect (typically observed):  Unable to Assess Orientation:  Oriented to Self, Oriented to Place Alcohol / Substance use:  Alcohol Use Psych involvement (Current and /or in the community):  No (Comment)  Discharge Needs  Concerns to be addressed:  Care Coordination, Home Safety Concerns, Substance Abuse Concerns Readmission within the last 30 days:  No Current discharge risk:  Physical Impairment, Substance Abuse, Cognitively Impaired Barriers to Discharge:  Continued Medical Work up   [employer], [place], LCSW 09/15/2015, 2:26 PM

## 2015-09-15 NOTE — NC FL2 (Signed)
[place] LEVEL OF CARE SCREENING TOOL     IDENTIFICATION  Patient Name: Edwin [name] Birthdate: December 24, 1940 Sex: male Admission Date (Current Location): 09/12/2015  [facility] and [place] Number: Herbalist and Address:  [facility] [place], [place] [address], [place], [place] [address]      Provider Number: [id number]  Attending Physician Name and Address:  Jonetta Osgood, MD  Relative Name and Phone Number:       Current Level of Care: Hospital Recommended Level of Care: [place] Prior Approval Number:    Date Approved/Denied:   PASRR Number:   [id number]  Discharge Plan: SNF    Current Diagnoses: Patient Active Problem List   Diagnosis Date Noted  . V tach ([place]) 09/12/2015  . Ventricular tachyarrhythmia ([place]) 09/12/2015  . Hypokalemia 09/12/2015  . Hyponatremia 09/12/2015  . Elevated lactic acid level 09/12/2015  . AKI (acute kidney injury) ([place]) 09/12/2015  . Gastric ulcer 07/28/2014  . Barrett's esophagus 07/28/2014  . GI bleed 07/26/2014  . Melena 07/26/2014  . Chronic diastolic CHF (congestive heart failure) ([place]) 07/26/2014  . Warfarin-induced coagulopathy ([place]) 07/26/2014  . Type 2 diabetes mellitus ([place]) 07/26/2014  . OSA (obstructive sleep apnea) 08/29/2013  . Unspecified intestinal obstruction ([place]) 07/01/2013  . Adynamic ileus ([place]) 06/27/2013  . Hyperlipidemia 06/24/2013  . Cellulitis 06/23/2013  . CHF (congestive heart failure) ([place]) 06/23/2013  . Atrial fibrillation ([place]) 06/23/2013  . HTN (hypertension) 06/23/2013  . Diabetes mellitus ([place]) 06/23/2013    Orientation ACTIVITIES/SOCIAL BLADDER RESPIRATION  Self   Incontinent Normal  BEHAVIORAL SYMPTOMS/MOOD NEUROLOGICAL BOWEL NUTRITION STATUS      Incontinent Diet (carb modified)  PHYSICIAN VISITS COMMUNICATION OF NEEDS Height & Weight Skin    Verbally   255 lbs.  (laceration)          AMBULATORY STATUS RESPIRATION    Assist  extensive Normal      Personal Care Assistance Level of Assistance  Bathing, Dressing Bathing Assistance: Limited assistance   Dressing Assistance: Maximum assistance      Functional Limitations Info                SPECIAL CARE FACTORS FREQUENCY  PT (By licensed PT)     PT Frequency: 5/week             Additional Factors Info  Allergies, Insulin Sliding Scale, Psychotropic   Allergies Info: lisinopril           Current Medications (09/15/2015): Current Facility-Administered Medications  Medication Dose Route Frequency Provider Last Rate Last Dose  . 0.9 % NaCl with KCl 20 mEq/ L  infusion   Intravenous Continuous Jonetta Osgood, MD 10 mL/hr at 09/14/15 2029    . aspirin EC tablet 325 mg  325 mg Oral Daily Jonetta Osgood, MD   325 mg at 09/15/15 1206  . FLUoxetine (PROZAC) capsule 20 mg  20 mg Oral Daily Reubin Milan, MD   20 mg at 09/15/15 1204  . folic acid (FOLVITE) tablet 1 mg  1 mg Oral Daily Reubin Milan, MD   1 mg at 09/15/15 1203  . insulin aspart (novoLOG) injection 0-15 Units  0-15 Units Subcutaneous TID WC Reubin Milan, MD   2 Units at 09/14/15 1847  . LORazepam (ATIVAN) injection 0-4 mg  0-4 mg Intravenous Q12H Reubin Milan, MD   2 mg at 09/14/15 2319  . LORazepam (ATIVAN) tablet 1 mg  1 mg Oral Q6H  PRN Reubin Milan, MD   1 mg at 09/14/15 0245   Or  . LORazepam (ATIVAN) injection 1 mg  1 mg Intravenous Q6H PRN Reubin Milan, MD   1 mg at 09/13/15 1356  . metoprolol (LOPRESSOR) tablet 50 mg  50 mg Oral BID Jonetta Osgood, MD   50 mg at 09/14/15 2317  . multivitamin with minerals tablet 1 tablet  1 tablet Oral Daily Reubin Milan, MD   1 tablet at 09/15/15 1205  . ondansetron (ZOFRAN) tablet 4 mg  4 mg Oral Q6H PRN Reubin Milan, MD       Or  . ondansetron [facility]) injection 4 mg  4 mg Intravenous Q6H PRN Reubin Milan, MD      . pantoprazole (PROTONIX) EC tablet 40 mg  40 mg Oral Daily Reubin Milan, MD   40 mg at 09/15/15 1206  . piperacillin-tazobactam (ZOSYN) IVPB 3.375 g  3.375 g Intravenous Q8H Reubin Milan, MD   3.375 g at 09/15/15 0729  . potassium chloride SA (K-DUR,KLOR-CON) CR tablet 20 mEq  20 mEq Oral BID Reubin Milan, MD   20 mEq at 09/15/15 1206  . thiamine (VITAMIN B-1) tablet 100 mg  100 mg Oral Daily Reubin Milan, MD   100 mg at 09/15/15 1203  . vancomycin (VANCOCIN) 1,250 mg in sodium chloride 0.9 % 250 mL IVPB  1,250 mg Intravenous Q24H Reubin Milan, MD   1,250 mg at 09/14/15 1849   Do not use this list as official medication orders. Please verify with discharge summary.  Discharge Medications:   Medication List    ASK your doctor about these medications        FLUoxetine 20 MG capsule  Commonly known as:  PROZAC  Take 20 mg by mouth daily.     furosemide 40 MG tablet  Commonly known as:  LASIX  Take 2 tablets (80 mg total) by mouth 2 (two) times daily.     loratadine 10 MG tablet  Commonly known as:  CLARITIN  Take 10 mg by mouth daily.     losartan 100 MG tablet  Commonly known as:  COZAAR  Take 100 mg by mouth daily.     metFORMIN 500 MG tablet  Commonly known as:  GLUCOPHAGE  Take 500 mg by mouth daily.     pantoprazole 40 MG tablet  Commonly known as:  PROTONIX  Take 1 tablet (40 mg total) by mouth daily.     potassium chloride SA 20 MEQ tablet  Commonly known as:  K-DUR,KLOR-CON  Take 1 tablet (20 mEq total) by mouth 2 (two) times daily.     pravastatin 20 MG tablet  Commonly known as:  PRAVACHOL  Take 1 tablet (20 mg total) by mouth daily after supper.     warfarin 5 MG tablet  Commonly known as:  COUMADIN  Take 2.5-5 mg by mouth daily after supper.        Relevant Imaging Results:  Relevant Lab Results:  Recent Labs    Additional Information    Cranford Mon, LCSW

## 2015-09-15 NOTE — Clinical Social Work Placement (Signed)
   CLINICAL SOCIAL WORK PLACEMENT  NOTE  Date:  09/15/2015  Patient Details  Name: Edwin [name] MRN: [id number] Date of Birth: 1941-06-09  Clinical Social Work is seeking post-discharge placement for this patient at the [place] level of care (*CSW will initial, date and re-position this form in  chart as items are completed):  Yes   Patient/family provided with [place] Work Department's list of facilities offering this level of care within the geographic area requested by the patient (or if unable, by the patient's family).  Yes   Patient/family informed of their freedom to choose among providers that offer the needed level of care, that participate in Medicare, Medicaid or managed care program needed by the patient, have an available bed and are willing to accept the patient.  Yes   Patient/family informed of [facility]'s ownership interest in [facility] and [facility], as well as of the fact that they are under no obligation to receive care at these facilities.  PASRR submitted to EDS on 09/15/15     PASRR number received on 09/15/15     Existing PASRR number confirmed on       FL2 transmitted to all facilities in geographic area requested by pt/family on 09/15/15     FL2 transmitted to all facilities within larger geographic area on       Patient informed that his/her managed care company has contracts with or will negotiate with certain facilities, including the following:            Patient/family informed of bed offers received.  Patient chooses bed at       Physician recommends and patient chooses bed at      Patient to be transferred to   on  .  Patient to be transferred to facility by       Patient family notified on   of transfer.  Name of family member notified:        PHYSICIAN Please sign FL2     Additional Comment:    _______________________________________________ Cranford Mon, LCSW 09/15/2015,  2:29 PM

## 2015-09-15 NOTE — Progress Notes (Signed)
Patient has a stage 2  at the right buttocks . Excoriated noted to bilateral inner buttocks. Deep tissue purple or maroon noted to mid sacrum  area. Repositioned the patient.  Left lower leg laceration with sutures.with minimal serosanguinous drainage and swelling noted.   Discoloration noted to bilateral lower extremities.. Report given to next shift nurse for wound consult.

## 2015-09-15 NOTE — Progress Notes (Signed)
PATIENT DETAILS Name: Edwin [name] Age: 73 y.o. Sex: male Date of Birth: 1941-09-15 Admit Date: 09/12/2015 Admitting Physician No admitting provider for patient encounter. CXK:GYJEHU,DJSHF E, MD   Brief narrative:  74 year old alcoholic male-history of atrial fibrillation on Coumadin, chronic diastolic heart failure presented to the hospital after a mechanical fall vs Syncope at home. While in the emergency room, he became unresponsive for a few seconds-during this time ventricular tachycardia was noted in the telemetry monitor. He spontaneously converted back to sinus rhythm and regained consciousness. He was also found to have a significantly elevated lactic acid and significant leukocytosis without any obvious source of infection. Cardiology was consulted-patient was started on a amiodarone infusion and admitted to the stepdown unit. Please see below for further details  Subjective: Awake and alert. Sleeping when I walked In this morning-easily aroused. Denied any shortness of breath or chest pain.  Assessment/Plan: Principal Problem: Syncope: Suspect secondary to V. tach. Echocardiogram demonstrates normal LV function, initially started on IV amiodarone-then transitioned to oral amiodarone-very poor long-term candidate for amiodarone given alcohol abuse/hepatitis-After discussion with cardiology amiodarone now discontinued. On oral Lopressor.2-D echocardiogram demonstrated preserved ejection function. Follow closely.  Active Problems: V. tach: Likely secondary to metabolic abnormalities-managed with IV amiodarone-as noted above-poor long-term candidate for oral amiodarone given alcoholic hepatitis/alcohol abuse-patient has no intention of quitting alcohol. After speaking with cardiology-amiodarone discontinued-started on oral Lopressor.  Sepsis: Afebrile but with significant leukocytosis on admission but is now down trending. No foci of infection evident on exam-chest  x-ray/UA negative for pneumonia/UTI. Blood cultures negative so far. Leukocytosis could be  be from inflammation from alcoholic hepatitis, large laceration in the left leg. Initially managed with both empiric vancomycin and Zosyn-Neurologist D escalated to Zosyn and if clinical improvement continues with narrow further to oral antibiotics.   Alcoholic hepatitis: LFTs downtrending, follow.  Coagulopathy: Suspect multifactorial-from Coumadin, could have underlying hepatitis/cirrhosis contributing. Coumadin held on admission-With no plans to resume on discharge (see below)-INR down trending slowly.   AKI: Likely mild prerenal azotemia-resolved with IV fluids  Mild hyponatremia: Resolving with IV fluids.  Alcohol abuse with withdrawal: Continue Ativan per protocol. Continues to be tremulous-but awake and alert. Follow and closely monitor in step down. Last drink on 10/23. Had discussion with patient this morning-he has no intention of quitting alcohol.  Left leg laceration: Sutured in the emergency room-will need removal of sutures and one week or so. Wound care evaluation while inpatient  Atrial fibrillation: Currently rate controlled with amiodarone, chronic Coumadin at home.CHA2DS2-VASc score of at least 3. Upon further chart review-patient had a GI bleed in September 2015-now with a fall causing a large left leg laceration-INR was supratherapeutic on admission. Very poor candidate to continue long-term anticoagulation-as he continues to drink alcohol and has no intention of quitting-puts him at a significant fall risk .Spoke with Dr. Romilda Garret both at this time recommend discontinuing anticoagulation-start aspirin-and reconsider starting anticoagulation in the outpatient setting once he is more committed to stopping alcohol use.   Essential hypertension: Hypotensive on initial presentation-blood pressure medications were held-now controlled with  Metoprolol  Type 2 diabetes:Resume metformin on  discharge-CBGs stable on SSI. Follow  GERD/Barrett's esophagus: Continue PPI  Dyslipidemia: Continue to hold statin until LFTs improve further  Other issues: Removed left femoral TLC on 10/24, PT evaluation  Disposition: Remain inpatient-transfer to telemetry-SNF on 10/27  Antimicrobial agents  See below  Anti-infectives    Start  Dose/Rate Route Frequency Ordered Stop   09/13/15 1700  vancomycin (VANCOCIN) 1,250 mg in sodium chloride 0.9 % 250 mL IVPB     1,250 mg 166.7 mL/hr over 90 Minutes Intravenous Every 24 hours 09/12/15 2150     09/12/15 2200  piperacillin-tazobactam (ZOSYN) IVPB 3.375 g  Status:  Discontinued     3.375 g 100 mL/hr over 30 Minutes Intravenous 3 times per day 09/12/15 2141 09/12/15 2142   09/12/15 2200  piperacillin-tazobactam (ZOSYN) IVPB 3.375 g     3.375 g 12.5 mL/hr over 240 Minutes Intravenous Every 8 hours 09/12/15 2143     09/12/15 1600  vancomycin (VANCOCIN) 2,000 mg in sodium chloride 0.9 % 500 mL IVPB     2,000 mg 250 mL/hr over 120 Minutes Intravenous  Once 09/12/15 1545 09/12/15 1931   09/12/15 1600  piperacillin-tazobactam (ZOSYN) IVPB 3.375 g     3.375 g 100 mL/hr over 30 Minutes Intravenous  Once 09/12/15 1545 09/12/15 1650      DVT Prophylaxis: INR therapeutic  Code Status: Full code   Family Communication Spoke with son-over the phone on 10/25  Procedures: None  CONSULTS:  cardiology  Time spent 25  minutes-Greater than 50% of this time was spent in counseling, explanation of diagnosis, planning of further management, and coordination of care.  MEDICATIONS: Scheduled Meds: . aspirin EC  325 mg Oral Daily  . FLUoxetine  20 mg Oral Daily  . folic acid  1 mg Oral Daily  . insulin aspart  0-15 Units Subcutaneous TID WC  . LORazepam  0-4 mg Intravenous Q12H  . metoprolol tartrate  50 mg Oral BID  . multivitamin with minerals  1 tablet Oral Daily  . pantoprazole  40 mg Oral Daily  . piperacillin-tazobactam (ZOSYN)   IV  3.375 g Intravenous Q8H  . potassium chloride SA  20 mEq Oral BID  . thiamine  100 mg Oral Daily  . vancomycin  1,250 mg Intravenous Q24H   Continuous Infusions: . 0.9 % NaCl with KCl 20 mEq / L 10 mL/hr at 09/14/15 2029   PRN Meds:.LORazepam **OR** LORazepam, ondansetron **OR** ondansetron (ZOFRAN) IV    PHYSICAL EXAM: Vital signs in last 24 hours: Filed Vitals:   09/15/15 0400 09/15/15 0600 09/15/15 0753 09/15/15 1200  BP: 123/73 137/83 122/74   Pulse: 73 63 62   Temp: 98 F (36.7 C)  98.4 F (36.9 C) 98.5 F (36.9 C)  TempSrc: Oral  Oral Oral  Resp: 17 13 25    Height:      Weight:      SpO2: 100% 99% 98%     Weight change:  Filed Weights   09/12/15 1616 09/12/15 2032  Weight: 115.667 kg (255 lb) 116.1 kg (255 lb 15.3 oz)   Body mass index is 35.71 kg/(m^2).   Gen Exam: Awake and alert with clear speech. Mildly tremulous Neck: Supple, No JVD.   Chest: B/L Clear.   CVS: S1 S2 Regular Abdomen: soft, BS +, non tender, non distended.  Extremities: no edema, lower extremities warm to touch. Left leg wound-lateral aspect-sutured-with dried blood Neurologic: Non Focal.   Skin: No Rash.   Wounds: N/A.   Intake/Output from previous day:  Intake/Output Summary (Last 24 hours) at 09/15/15 1306 Last data filed at 09/15/15 0729  Gross per 24 hour  Intake    480 ml  Output    600 ml  Net   -120 ml     LAB RESULTS: CBC  Recent Labs Lab  09/12/15 1501 09/12/15 2057 09/13/15 0430 09/14/15 0345 09/15/15 0312  WBC 18.7* 28.4* 24.8* 16.6* 15.6*  HGB 13.8 10.7* 10.0* 8.8* 8.6*  HCT 39.5 30.9* 29.1* 25.8* 25.5*  PLT 199 178 176 136* 162  MCV 91.9 91.7 92.1 92.8 95.9  MCH 32.1 31.8 31.6 31.7 32.3  MCHC 34.9 34.6 34.4 34.1 33.7  RDW 14.4 14.6 14.8 14.9 15.0  LYMPHSABS 0.9 1.1 0.7 1.0  --   MONOABS 1.0 1.4* 1.9* 1.4*  --   EOSABS 0.0 0.0 0.0 0.0  --   BASOSABS 0.0 0.0 0.0 0.0  --     Chemistries   Recent Labs Lab 09/12/15 1559 09/12/15 1606  09/12/15 2057 09/13/15 0430 09/14/15 0345 09/15/15 0312  NA  --  131* 131* 133* 132* 133*  K  --  3.1* 4.4 4.3 3.8 3.8  CL  --  96* 94* 100* 101 102  CO2  --  16* 22 23 25 25   GLUCOSE  --  205* 208* 210* 142* 146*  BUN  --  10 11 13 10 9   CREATININE  --  1.56* 1.35* 1.24 0.92 1.03  CALCIUM  --  7.7* 7.8* 8.2* 8.4* 8.6*  MG 1.7  --   --   --   --  1.9    CBG:  Recent Labs Lab 09/14/15 1148 09/14/15 1555 09/14/15 2315 09/15/15 0752 09/15/15 0958  GLUCAP 142* 134* 126* 147* 114*    GFR Estimated Creatinine Clearance: 82.8 mL/min (by C-G formula based on Cr of 1.03).  Coagulation profile  Recent Labs Lab 09/12/15 1606 09/13/15 0430 09/14/15 0345  INR 5.33* 6.03* 2.58*    Cardiac Enzymes  Recent Labs Lab 09/12/15 1606 09/12/15 2057 09/13/15 0430  TROPONINI 0.03 0.03 0.04*    Invalid input(s): POCBNP No results for input(s): DDIMER in the last 72 hours.  Recent Labs  09/12/15 2057  HGBA1C 5.6   No results for input(s): CHOL, HDL, LDLCALC, TRIG, CHOLHDL, LDLDIRECT in the last 72 hours. No results for input(s): TSH, T4TOTAL, T3FREE, THYROIDAB in the last 72 hours.  Invalid input(s): FREET3 No results for input(s): VITAMINB12, FOLATE, FERRITIN, TIBC, IRON, RETICCTPCT in the last 72 hours. No results for input(s): LIPASE, AMYLASE in the last 72 hours.  Urine Studies No results for input(s): UHGB, CRYS in the last 72 hours.  Invalid input(s): UACOL, UAPR, USPG, UPH, UTP, UGL, UKET, UBIL, UNIT, UROB, ULEU, UEPI, UWBC, URBC, UBAC, CAST, UCOM, BILUA  MICROBIOLOGY: Recent Results (from the past 240 hour(s))  Blood culture (routine x 2)     Status: None (Preliminary result)   Collection Time: 09/12/15  3:55 PM  Result Value Ref Range Status   Specimen Description BLOOD CENTRAL LINE  Final   Special Requests BOTTLES DRAWN AEROBIC ONLY 5CC  Final   Culture NO GROWTH 2 DAYS  Final   Report Status PENDING  Incomplete  Blood culture (routine x 2)     Status:  None (Preliminary result)   Collection Time: 09/12/15  3:59 PM  Result Value Ref Range Status   Specimen Description BLOOD LEFT HAND  Final   Special Requests IN PEDIATRIC BOTTLE 2CC  Final   Culture NO GROWTH 2 DAYS  Final   Report Status PENDING  Incomplete  MRSA PCR Screening     Status: None   Collection Time: 09/12/15  8:38 PM  Result Value Ref Range Status   MRSA by PCR NEGATIVE NEGATIVE Final    Comment:  The GeneXpert MRSA Assay (FDA approved for NASAL specimens only), is one component of a comprehensive MRSA colonization surveillance program. It is not intended to diagnose MRSA infection nor to guide or monitor treatment for MRSA infections.     RADIOLOGY STUDIES/RESULTS: Ct Head Wo Contrast  09/12/2015  CLINICAL DATA:  Recent fall, anticoagulated EXAM: CT HEAD WITHOUT CONTRAST TECHNIQUE: Contiguous axial images were obtained from the base of the skull through the vertex without intravenous contrast. COMPARISON:  None. FINDINGS: Bony calvarium is intact. Mucosal changes are noted within the left maxillary antrum of uncertain chronicity. Mild atrophic changes are noted commenced with the patient's given age. No findings to suggest acute hemorrhage, acute infarction or space-occupying mass lesion are noted. IMPRESSION: Mild atrophic changes without acute abnormality. Electronically Signed   By: Inez Catalina M.D.   On: 09/12/2015 18:45   Dg Chest Portable 1 View  09/12/2015  CLINICAL DATA:  Fall today and became unresponsive at hospital. EXAM: PORTABLE CHEST 1 VIEW COMPARISON:  07/26/2014 FINDINGS: Lungs are hypoinflated without focal consolidation, effusion or pneumothorax. Note that the lateral left lung base is cut off the film. Remainder of the exam is unchanged. IMPRESSION: Hypoinflation without acute cardiopulmonary disease. Electronically Signed   By: Marin Olp M.D.   On: 09/12/2015 15:05    Oren Binet, MD  [facility] Pager:[phone]  If  7PM-7AM, please contact night-coverage [email or web address] Password [email or web address] 09/15/2015, 1:06 PM   LOS: 3 days

## 2015-09-15 NOTE — Progress Notes (Signed)
CSW spoke with pt son who is agreeable to SNF for short term rehab- bed search initiated  Domenica Reamer, [place] Social Worker [phone]

## 2015-09-15 NOTE — Care Management Important Message (Signed)
Important Message  Patient Details  Name: Edwin [name] MRN: [id number] Date of Birth: [birth year]   Medicare Important Message Given:  Yes-second notification given    Nathen May 09/15/2015, 1:14 PM

## 2015-09-16 DIAGNOSIS — L8990 Pressure ulcer of unspecified site, unspecified stage: Secondary | ICD-10-CM | POA: Insufficient documentation

## 2015-09-16 DIAGNOSIS — E1169 Type 2 diabetes mellitus with other specified complication: Secondary | ICD-10-CM

## 2015-09-16 LAB — BASIC METABOLIC PANEL
Anion gap: 5 (ref 5–15)
BUN: 11 mg/dL (ref 6–20)
CALCIUM: 8.5 mg/dL — AB (ref 8.9–10.3)
CO2: 26 mmol/L (ref 22–32)
CREATININE: 0.97 mg/dL (ref 0.61–1.24)
Chloride: 106 mmol/L (ref 101–111)
GFR calc Af Amer: >60 mL/min (ref >60)
GLUCOSE: 109 mg/dL — AB (ref 65–99)
Potassium: 4.5 mmol/L (ref 3.5–5.1)
SODIUM: 137 mmol/L (ref 135–145)

## 2015-09-16 LAB — GLUCOSE, CAPILLARY
GLUCOSE-CAPILLARY: 119 mg/dL — AB (ref 65–99)
Glucose-Capillary: 97 mg/dL (ref 65–99)

## 2015-09-16 LAB — C DIFFICILE QUICK SCREEN W PCR REFLEX
C DIFFICILE (CDIFF) TOXIN: NEGATIVE
C DIFFICLE (CDIFF) ANTIGEN: POSITIVE — AB

## 2015-09-16 LAB — MAGNESIUM: Magnesium: 2.1 mg/dL (ref 1.7–2.4)

## 2015-09-16 MED ORDER — AMOXICILLIN-POT CLAVULANATE 875-125 MG PO TABS
1.0000 | ORAL_TABLET | Freq: Two times a day (BID) | ORAL | Status: DC
Start: 2015-09-16 — End: 2015-10-03

## 2015-09-16 MED ORDER — POTASSIUM CHLORIDE CRYS ER 20 MEQ PO TBCR: 20.0000 meq | EXTENDED_RELEASE_TABLET | Freq: Every day | ORAL | Status: AC

## 2015-09-16 MED ORDER — METFORMIN HCL 500 MG PO TABS: 500.0000 mg | ORAL_TABLET | Freq: Two times a day (BID) | ORAL | Status: DC

## 2015-09-16 MED ORDER — ASPIRIN 325 MG PO TBEC: 325.0000 mg | DELAYED_RELEASE_TABLET | Freq: Every day | ORAL | Status: DC

## 2015-09-16 MED ORDER — METOPROLOL TARTRATE 50 MG PO TABS: 50.0000 mg | ORAL_TABLET | Freq: Two times a day (BID) | ORAL | Status: DC

## 2015-09-16 MED ORDER — FOLIC ACID 1 MG PO TABS: 1.0000 mg | ORAL_TABLET | Freq: Every day | ORAL | Status: AC

## 2015-09-16 NOTE — Progress Notes (Signed)
Patient will discharge to [facility] Anticipated discharge date:09/16/15 Family notified:pt son Transportation by [employer]- scheduled at [place] signing off.  Domenica Reamer, [place] Social Worker [phone]

## 2015-09-16 NOTE — Care Management Note (Signed)
Case Management Note  Patient Details  Name: Jax Kentner MRN: [id number] Date of Birth: Jan 02, 1941  Subjective/Objective:   74 y.o. Male admitted from home alone.                  Action/Plan:Will be discharged to SNF today. No CM needs.    Expected Discharge Date:                  Expected Discharge Plan:  Skilled Nursing Facility  In-House Referral:  Clinical Social Work  Discharge planning Services  CM Consult  Post Acute Care Choice:    Choice offered to:     DME Arranged:    DME Agency:     HH Arranged:    [place] Agency:     Status of Service:  Completed, signed off  Medicare Important Message Given:  Yes-second notification given Date Medicare IM Given:    Medicare IM give by:    Date Additional Medicare IM Given:    Additional Medicare Important Message give by:     If discussed at [place] of Stay Meetings, dates discussed:    Additional Comments:  Delrae Sawyers, RN 09/16/2015, 11:19 AM

## 2015-09-16 NOTE — Discharge Summary (Addendum)
PATIENT DETAILS Name: Edwin [name] Age: 74 y.o. Sex: male Date of Birth: Nov 23, 1940 MRN: [id number]. Admitting Physician: Barton Dubois, MD OTL:[id number],BTDHR E, MD  Admit Date: 09/12/2015 Discharge date: 09/16/2015  Recommendations for Outpatient Follow-up:  1. Please ensure follow up with cardiology-Dr Einar Gip 2. Please re-address issues of anticoagulation in the outpatient setting-currently off coumadin. 3. Please repeat CBC/BMET in 1 week 4. Please follow blood cultures till final  PRIMARY DISCHARGE DIAGNOSIS:  Principal Problem:   Ventricular tachyarrhythmia (HCC) Active Problems:   Atrial fibrillation (HCC)   HTN (hypertension)   Hyperlipidemia   OSA (obstructive sleep apnea)   Type 2 diabetes mellitus (HCC)   V tach (HCC)   Hypokalemia   Hyponatremia   Elevated lactic acid level   AKI (acute kidney injury) ([place])   Pressure ulcer      PAST MEDICAL HISTORY: Past Medical History  Diagnosis Date  . Hypertension     benign essential hypertension  . Diabetes mellitus     NIDDM  . Morbid obesity ([place])   . Pure hypercholesterolemia   . Long-term (current) use of anticoagulants   . Varicose veins of legs   . Pulmonary hypertension (HCC) MODERATE  . Cor pulmonale, chronic ([place])   . Chronic right-sided CHF (congestive heart failure) ([place])   . Atrial fibrillation ([place]) CARDIOLOGIST-- DR Einar Gip  . Anxiety   . Shortness of breath     with exertion   . Arthritis   . Adynamic ileus ([place])   . GERD (gastroesophageal reflux disease)   . Cholelithiasis   . OSA (obstructive sleep apnea) 08/29/2013  . Barrett esophagus   . Colon polyps     TUBULAR ADENOMA (2) & HYPERPLASTIC POLYP  . GI bleed   . Sleep apnea     no CPAP    DISCHARGE MEDICATIONS: Current Discharge Medication List    START taking these medications   Details  amoxicillin-clavulanate (AUGMENTIN) 875-125 MG tablet Take 1 tablet by mouth 2 (two) times daily. 2 more days from 09/16/15    aspirin EC 325  MG EC tablet Take 1 tablet (325 mg total) by mouth daily.    folic acid (FOLVITE) 1 MG tablet Take 1 tablet (1 mg total) by mouth daily.    metoprolol (LOPRESSOR) 50 MG tablet Take 1 tablet (50 mg total) by mouth 2 (two) times daily.      CONTINUE these medications which have CHANGED   Details  metFORMIN (GLUCOPHAGE) 500 MG tablet Take 1 tablet (500 mg total) by mouth 2 (two) times daily with a meal.    potassium chloride SA (K-DUR,KLOR-CON) 20 MEQ tablet Take 1 tablet (20 mEq total) by mouth daily. Qty: 60 tablet, Refills: 3      CONTINUE these medications which have NOT CHANGED   Details  FLUoxetine (PROZAC) 20 MG capsule Take 20 mg by mouth daily. Refills: 2    loratadine (CLARITIN) 10 MG tablet Take 10 mg by mouth daily.    pantoprazole (PROTONIX) 40 MG tablet Take 1 tablet (40 mg total) by mouth daily. Qty: 60 tablet, Refills: 2    pravastatin (PRAVACHOL) 20 MG tablet Take 1 tablet (20 mg total) by mouth daily after supper.      STOP taking these medications     losartan (COZAAR) 100 MG tablet      warfarin (COUMADIN) 5 MG tablet      furosemide (LASIX) 40 MG tablet         ALLERGIES:   Allergies  Allergen Reactions  .  Lisinopril Swelling    Tongue swelling    BRIEF HPI:  See H&P, Labs, Consult and Test reports for all details in brief, patient is a 74 year old alcoholic male-history of atrial fibrillation on Coumadin, chronic diastolic heart failure presented to the hospital after a mechanical fall vs Syncope at home. While in the emergency room, he became unresponsive for a few seconds-during this time ventricular tachycardia was noted in the telemetry monitor. He spontaneously converted back to sinus rhythm and regained consciousness. He was also found to have a significantly elevated lactic acid and significant leukocytosis without any obvious source of infection. Cardiology was consulted-patient was started on a amiodarone infusion and admitted to the  stepdown unit.  CONSULTATIONS:   cardiology  PERTINENT RADIOLOGIC STUDIES: Ct Head Wo Contrast  09/12/2015  CLINICAL DATA:  Recent fall, anticoagulated EXAM: CT HEAD WITHOUT CONTRAST TECHNIQUE: Contiguous axial images were obtained from the base of the skull through the vertex without intravenous contrast. COMPARISON:  None. FINDINGS: Bony calvarium is intact. Mucosal changes are noted within the left maxillary antrum of uncertain chronicity. Mild atrophic changes are noted commenced with the patient's given age. No findings to suggest acute hemorrhage, acute infarction or space-occupying mass lesion are noted. IMPRESSION: Mild atrophic changes without acute abnormality. Electronically Signed   By: Inez Catalina M.D.   On: 09/12/2015 18:45   Dg Chest Portable 1 View  09/12/2015  CLINICAL DATA:  Fall today and became unresponsive at hospital. EXAM: PORTABLE CHEST 1 VIEW COMPARISON:  07/26/2014 FINDINGS: Lungs are hypoinflated without focal consolidation, effusion or pneumothorax. Note that the lateral left lung base is cut off the film. Remainder of the exam is unchanged. IMPRESSION: Hypoinflation without acute cardiopulmonary disease. Electronically Signed   By: Marin Olp M.D.   On: 09/12/2015 15:05     PERTINENT LAB RESULTS: CBC:  Recent Labs  09/14/15 0345 09/15/15 0312  WBC 16.6* 15.6*  HGB 8.8* 8.6*  HCT 25.8* 25.5*  PLT 136* 162   CMET CMP     Component Value Date/Time   NA 137 09/16/2015 0319   K 4.5 09/16/2015 0319   CL 106 09/16/2015 0319   CO2 26 09/16/2015 0319   GLUCOSE 109* 09/16/2015 0319   BUN 11 09/16/2015 0319   CREATININE 0.97 09/16/2015 0319   CALCIUM 8.5* 09/16/2015 0319   PROT 5.2* 09/15/2015 0312   ALBUMIN 2.7* 09/15/2015 0312   AST 53* 09/15/2015 0312   ALT 61 09/15/2015 0312   ALKPHOS 56 09/15/2015 0312   BILITOT 1.8* 09/15/2015 0312   GFRNONAA >60 09/16/2015 0319   GFRAA >60 09/16/2015 0319    GFR Estimated Creatinine Clearance: 87.9  mL/min (by C-G formula based on Cr of 0.97). No results for input(s): LIPASE, AMYLASE in the last 72 hours. No results for input(s): CKTOTAL, CKMB, CKMBINDEX, TROPONINI in the last 72 hours. Invalid input(s): POCBNP No results for input(s): DDIMER in the last 72 hours. No results for input(s): HGBA1C in the last 72 hours. No results for input(s): CHOL, HDL, LDLCALC, TRIG, CHOLHDL, LDLDIRECT in the last 72 hours. No results for input(s): TSH, T4TOTAL, T3FREE, THYROIDAB in the last 72 hours.  Invalid input(s): FREET3 No results for input(s): VITAMINB12, FOLATE, FERRITIN, TIBC, IRON, RETICCTPCT in the last 72 hours. Coags:  Recent Labs  09/14/15 0345  INR 2.58*   Microbiology: Recent Results (from the past 240 hour(s))  Blood culture (routine x 2)     Status: None (Preliminary result)   Collection Time: 09/12/15  3:55 PM  Result Value Ref Range Status   Specimen Description BLOOD CENTRAL LINE  Final   Special Requests BOTTLES DRAWN AEROBIC ONLY 5CC  Final   Culture NO GROWTH 3 DAYS  Final   Report Status PENDING  Incomplete  Blood culture (routine x 2)     Status: None (Preliminary result)   Collection Time: 09/12/15  3:59 PM  Result Value Ref Range Status   Specimen Description BLOOD LEFT HAND  Final   Special Requests IN PEDIATRIC BOTTLE 2CC  Final   Culture NO GROWTH 3 DAYS  Final   Report Status PENDING  Incomplete  MRSA PCR Screening     Status: None   Collection Time: 09/12/15  8:38 PM  Result Value Ref Range Status   MRSA by PCR NEGATIVE NEGATIVE Final    Comment:        The GeneXpert MRSA Assay (FDA approved for NASAL specimens only), is one component of a comprehensive MRSA colonization surveillance program. It is not intended to diagnose MRSA infection nor to guide or monitor treatment for MRSA infections.      BRIEF HOSPITAL COURSE:  Syncope: Suspect secondary to V. tach. Echocardiogram demonstrates normal LV function, initially started on IV  amiodarone-and then transitioned to oral amiodarone-after discussion with cardiology, felt to be a very poor long-term candidate for amiodarone given alcohol abuse/hepatitis-hence discontinued. No further V Tach in telemetry monitoring-no further recommendations from cardiology.  Active Problems: V. tach: Likely secondary to metabolic abnormalities-managed with IV amiodarone-as noted above-poor long-term candidate for oral amiodarone given alcoholic hepatitis/alcohol abuse-patient has no intention of quitting alcohol. Spoke with Dr. Gardiner Fanti recommends discontinuing amiodarone and monitoring.Please ensure follow up with Dr Einar Gip.  Sepsis: Afebrile but with significant leukocytosis on admission but is now down trending. No foci of infection evident on exam-chest x-ray/UA negative for pneumonia/UTI. Blood cultures negative so far. Leukocytosis could be be from inflammation from alcoholic hepatitis, large laceration in the left leg. Managed with empiric vancomycin and Zosyn, since improved will transition to Augmentin for a few more days to complete 7 days of treatment.    Alcoholic hepatitis: LFTs downtrending-AST down to 53 and ALT down to 61, follow closely in the outpatient setting.  Coagulopathy: Suspect multifactorial-from Coumadin, could have underlying hepatitis/cirrhosis contributing. Coumadin held on admission-INR down trending slowly. See below  AKI: Likely mild prerenal azotemia-resolved with IV fluids  Mild hyponatremia: Resolved with IV fluids.  Alcohol abuse with withdrawal: Managed with Ativan per protocol. Hardly any tremors on day of discharge, awake and alert. Last drink on 10/23. Had discussion with patient -he has no intention of quitting alcohol.  Left leg laceration: Sutured in the emergency room-will need removal of sutures and one week or so.   Atrial fibrillation: Currently rate controlled,was on chronic Coumadin at home.CHA2DS2-VASc score of at least 3. Upon further chart  review-patient had a GI bleed in September 2015-now with a fall causing a large left leg laceration-INR was supratherapeutic on admission. Very poor candidate to continue long-term anticoagulation-as he continues to drink alcohol and has no intention of quitting.Spoke with Dr. Romilda Garret both at this time recommend discontinuing anticoagulation-start aspirin-and reconsider starting anticoagulation in the outpatient setting once he is more committed to stopping alcohol use. This was discussed with patient's son Broadus John over the phone and with the patient at bedside-they both understand the difficult situation-and risk of cardioembolic phenomena-including CVA. Patient will follow up with Dr Einar Gip in the outpatient setting-to discuss anticoagulation issues.For now will keep on ASA.  Essential hypertension:  Hypotensive on initial presentation-blood pressure now creeping up-started metoprolol-follow and optimize in the outpatient setting.  Type 2 diabetes: Resume metformin on discharge-CBGs stable on SSI-while inpatient.  GERD/Barrett's esophagus: Continue PPI  Dyslipidemia: statin initially held-will restart on discharge-as LFT's much improved.    Deconditioning:secondary to acute illness-seen by PT-recommendations are for SNF. Patient agreeable.  Addendum: RN then ordered C. difficile PCR (without talking to this M.D.)-patient has 1 or 2 loose stools since yesterday on a daily basis -but no diarrhea. He is clinically improved. Unfortunately C. difficile PCR is negative, but C. difficile antigen is positive-I believe this is a colonization-hence we will not treat this.  TODAY-DAY OF DISCHARGE:  Subjective:   Kaleen Odea today has no headache,no chest abdominal pain,no new weakness tingling or numbness  Objective:   Blood pressure 136/73, pulse 70, temperature 98.5 F (36.9 C), temperature source Oral, resp. rate 18, height 5\' 11"  (1.803 m), weight 116.1 kg (255 lb 15.3 oz), SpO2 99  %.  Intake/Output Summary (Last 24 hours) at 09/16/15 0830 Last data filed at 09/16/15 0600  Gross per 24 hour  Intake    850 ml  Output    875 ml  Net    -25 ml   Filed Weights   09/12/15 2032 09/15/15 1632 09/16/15 0522  Weight: 116.1 kg (255 lb 15.3 oz) 117.4 kg (258 lb 13.1 oz) 116.1 kg (255 lb 15.3 oz)    Exam Awake Alert, Oriented *3, No new F.N deficits, Normal affect [place].AT,PERRAL Supple Neck,No JVD, No cervical lymphadenopathy appriciated.  Symmetrical Chest wall movement, Good air movement bilaterally, CTAB RRR,No Gallops,Rubs or new Murmurs, No Parasternal Heave +ve B.Sounds, Abd Soft, Non tender, No organomegaly appriciated, No rebound -guarding or rigidity. No Cyanosis, Clubbing or edema, No new Rash or bruise  DISCHARGE CONDITION: Stable  DISPOSITION: SNF  DISCHARGE INSTRUCTIONS:    Activity:  As tolerated with Full fall precautions use walker/cane & assistance as needed  Get Medicines reviewed and adjusted: Please take all your medications with you for your next visit with your Primary MD  Please request your Primary MD to go over all hospital tests and procedure/radiological results at the follow up, please ask your Primary MD to get all Hospital records sent to his/her office.  If you experience worsening of your admission symptoms, develop shortness of breath, life threatening emergency, suicidal or homicidal thoughts you must seek medical attention immediately by calling 911 or calling your MD immediately  if symptoms less severe.  You must read complete instructions/literature along with all the possible adverse reactions/side effects for all the Medicines you take and that have been prescribed to you. Take any new Medicines after you have completely understood and accpet all the possible adverse reactions/side effects.   Do not drive when taking Pain medications.   Do not take more than prescribed Pain, Sleep and Anxiety Medications  Special  Instructions: If you have smoked or chewed Tobacco  in the last 2 yrs please stop smoking, stop any regular Alcohol  and or any Recreational drug use.  Wear Seat belts while driving.  Please note  You were cared for by a hospitalist during your hospital stay. Once you are discharged, your primary care physician will handle any further medical issues. Please note that NO REFILLS for any discharge medications will be authorized once you are discharged, as it is imperative that you return to your primary care physician (or establish a relationship with a primary care physician if you do not have  one) for your aftercare needs so that they can reassess your need for medications and monitor your lab values.   Diet recommendation: Diabetic Diet Heart Healthy diet  Discharge Instructions    Call MD for:  difficulty breathing, headache or visual disturbances    Complete by:  As directed      Call MD for:  persistant dizziness or light-headedness    Complete by:  As directed      Call MD for:  redness, tenderness, or signs of infection (pain, swelling, redness, odor or green/yellow discharge around incision site)    Complete by:  As directed      Diet - low sodium heart healthy    Complete by:  As directed      Diet Carb Modified    Complete by:  As directed      Increase activity slowly    Complete by:  As directed            Follow-up Information    Follow up with BOUSKA,DAVID E, MD. Schedule an appointment as soon as possible for a visit in 2 weeks.   Specialty:  Family Medicine   Contact information:   [address] [place] [address] [phone]       Follow up with Adrian Prows, MD. Schedule an appointment as soon as possible for a visit in 2 weeks.   Specialty:  Cardiology   Contact information:   [address] [place] [address] [phone]      Total Time spent on discharge equals 45  minutes.  SignedOren Binet 09/16/2015 8:30 AM

## 2015-09-16 NOTE — Consult Note (Signed)
WOC wound consult note Reason for Consult: Left leg laceration, sustained during a fall at home 09/12/15.  Was sutured closed in the ED.  Also requested to look at sacrum.  He does have some bruising over the sacrum but does not appear to be deep tissue injury.  He reports he may have hit this area when he fell and that he was on his back for a period of time after the fall.  Wound type: Laceration left leg Measurement: 9cm Wound bed: closed Drainage (amount, consistency, odor) minimal, serosanguinous  Periwound: intact, chronic LE edema  Dressing procedure/placement/frequency: Add non adherent to cover and protect, needs follow up for suture removal to be ordered per the MD.  Discussed POC with patient and bedside nurse.  Re consult if needed, will not follow at this time. Thanks  Kiowa Hollar [place], [place] [phone])

## 2015-09-16 NOTE — Progress Notes (Signed)
Paged Tylene Fantasia NP for wound consult order, advised that day shift MD will evaluate.

## 2015-09-16 NOTE — Progress Notes (Signed)
Report called to [facility]. Spoke with Nurse Amalia Hailey.

## 2015-09-17 LAB — CULTURE, BLOOD (ROUTINE X 2)
Culture: NO GROWTH
Culture: NO GROWTH

## 2015-09-24 ENCOUNTER — Non-Acute Institutional Stay (SKILLED_NURSING_FACILITY): Payer: MEDICARE | Admitting: Internal Medicine

## 2015-09-24 DIAGNOSIS — E785 Hyperlipidemia, unspecified: Secondary | ICD-10-CM | POA: Diagnosis not present

## 2015-09-24 DIAGNOSIS — I472 Ventricular tachycardia, unspecified: Secondary | ICD-10-CM

## 2015-09-24 DIAGNOSIS — S81812D Laceration without foreign body, left lower leg, subsequent encounter: Secondary | ICD-10-CM

## 2015-09-24 DIAGNOSIS — K7010 Alcoholic hepatitis without ascites: Secondary | ICD-10-CM | POA: Diagnosis not present

## 2015-09-24 DIAGNOSIS — K22719 Barrett's esophagus with dysplasia, unspecified: Secondary | ICD-10-CM | POA: Diagnosis not present

## 2015-09-24 DIAGNOSIS — R29898 Other symptoms and signs involving the musculoskeletal system: Secondary | ICD-10-CM | POA: Diagnosis not present

## 2015-09-24 DIAGNOSIS — R55 Syncope and collapse: Secondary | ICD-10-CM

## 2015-09-24 DIAGNOSIS — R7989 Other specified abnormal findings of blood chemistry: Secondary | ICD-10-CM | POA: Diagnosis not present

## 2015-09-24 DIAGNOSIS — E119 Type 2 diabetes mellitus without complications: Secondary | ICD-10-CM | POA: Diagnosis not present

## 2015-09-24 DIAGNOSIS — I10 Essential (primary) hypertension: Secondary | ICD-10-CM | POA: Diagnosis not present

## 2015-09-24 DIAGNOSIS — I482 Chronic atrial fibrillation, unspecified: Secondary | ICD-10-CM

## 2015-09-24 DIAGNOSIS — N19 Unspecified kidney failure: Secondary | ICD-10-CM

## 2015-09-24 DIAGNOSIS — D689 Coagulation defect, unspecified: Secondary | ICD-10-CM

## 2015-09-24 NOTE — Progress Notes (Signed)
Patient ID: Edwin [name], male   DOB: September 02, 1941, 74 y.o.   MRN: [id number]  Provider:  Rexene Edison. Mariea Clonts, D.O., C.M.D. Location:  [facility] (formerly [facility]) PCP: Phineas Inches, MD  Code Status: full code Goals of Care: Advanced Directive information Does patient have an advance directive?: No, Would patient like information on creating an advanced directive?: Yes - Educational materials given   Chief Complaint  Patient presents with  . New Admit To SNF    s/p hospitalization 10/23-27   . Discharge Note    discharged also today to home with home health PT only    HPI: Patient is a 74 y.o. male seen today for admission to [facility] s/p hospitalization from 10/23-27/16 for ventricular tachyarrthymia.  He has a h/o alcoholism, afib on coumadin, chronic diastolic chf who was sent to the hospital after an episode of mechanical fall vs. Syncope at home.  He was unresponsive in the ED for a few seconds and was in VT.  He spontaneously converted back to NSR and became alert.  Cardiology was consulted and started amiodarone infusion.  He had significantly elevated lactic acid and leukocytosis w/o obvious cause.  His syncope was felt to be due to vT.  He underwent echo with nl LV function.  He was transitioned to po amiodarone, but then it was discontinued due to alcohol abuse, hepatitis.  He had no further VT episodes.  He f/u'd with Dr. Einar Gip as an outpatient yesterday and was put back on the coumadin.  Alcoholic hepatitis:  AST and ALT trending downward at 53 and 61 respectively.  He had his coumadin held at admission due to elevated INR which was trending downward.  He tells me today that he will not drink again after he was told it will kill him.  Coagulopathy:  Due to coumadin, hepatitis/cirhosis.    AKI:  With prerenal azotemia that resolved with hydration  Hyponatremia also improved after hydration.  Left leg laceration was sutured and to  be removed after one week.    Afib:  On chronic coumadin and rate control at home with at least a score of 3.  H/o GI bleed in 9/15.  INR was supratherapeutic at admission.  Opted to d/c coumadin due to high risk of bleeding with falls and alcohol abuse.  Started on asa.    HTN:  Low at admission but improved.  Stable here at [facility].  DMII:  Restarted on metformin.    GERD/barrett's esophagus:  Continues ppi  Hyperlipidemia:  Was restarted on his statin when lfts improved.  Deconditioning:  Due to acute illness.  He has done therapy here at [facility] and [place] and is walking independently.  He is going home with home health PT.   Review of Systems  Constitutional: Negative for fever and chills.  HENT: Negative for congestion.   Eyes: Negative for blurred vision.  Respiratory: Negative for shortness of breath.   Cardiovascular: Positive for leg swelling. Negative for chest pain and palpitations.  Gastrointestinal: Positive for heartburn. Negative for abdominal pain, constipation, blood in stool and melena.  Genitourinary: Negative for dysuria.  Musculoskeletal: Positive for falls.  Skin:       Left leg with large curved laceration from his fall  Neurological: Positive for loss of consciousness. Negative for dizziness.  Endo/Heme/Allergies: Bruises/bleeds easily.  Psychiatric/Behavioral: Positive for substance abuse. Negative for depression and memory loss.       Alcohol after his wife's death  Past Medical History  Diagnosis Date  . Hypertension     benign essential hypertension  . Diabetes mellitus     NIDDM  . Morbid obesity ([place])   . Pure hypercholesterolemia   . Long-term (current) use of anticoagulants   . Varicose veins of legs   . Pulmonary hypertension (HCC) MODERATE  . Cor pulmonale, chronic ([place])   . Chronic right-sided CHF (congestive heart failure) ([place])   . Atrial fibrillation ([place]) CARDIOLOGIST-- DR Einar Gip  . Anxiety   . Shortness of breath     with  exertion   . Arthritis   . Adynamic ileus ([place])   . GERD (gastroesophageal reflux disease)   . Cholelithiasis   . OSA (obstructive sleep apnea) 08/29/2013  . Barrett esophagus   . Colon polyps     TUBULAR ADENOMA (2) & HYPERPLASTIC POLYP  . GI bleed   . Sleep apnea     no CPAP   Past Surgical History  Procedure Laterality Date  . Cardiovascular stress test  07-19-2012  DR Einar Gip    PROMINENT DIAPHRAGMATIC ATTENUATION/ NORMAL LVEF/ LOW RISK STUDY  . Transthoracic echocardiogram  05-16-2012    LOW NORMAL LVEF/ MOD. RV/ MILD HYPOKINESIS/ MOD. PULMONARY HTN/ CHRONIC COR PUMONALE/ NO SIG. CHANGE FROM 12-01-2010  . Mouth surgery    . Benign mole removal from nose     . Knee arthroscopy with medial menisectomy Left 04/23/2013    Procedure: LEFT KNEE ARTHROSCOPY WITH PARTIAL MEDIAL MENISECTOMY;  Surgeon: Magnus Sinning, MD;  Location: [facility];  Service: Orthopedics;  Laterality: Left;  . Esophagogastroduodenoscopy (egd) with propofol N/A 07/28/2014    Procedure: ESOPHAGOGASTRODUODENOSCOPY (EGD) WITH PROPOFOL;  Surgeon: Irene Shipper, MD;  Location: [facility];  Service: Endoscopy;  Laterality: N/A;  . Colonoscopy      reports that he quit smoking about 28 years ago. His smoking use included Cigarettes. He smoked 1.00 pack per day. He has never used smokeless tobacco. He reports that he drinks alcohol. He reports that he does not use illicit drugs. Family History  Problem Relation Age of Onset  . Heart disease Mother   . Hypertension Father   . Colon cancer Neg Hx   . Throat cancer Paternal Uncle   . Prostate cancer Neg Hx   . Rectal cancer Neg Hx   . Stomach cancer Neg Hx     Allergies  Allergen Reactions  . Lisinopril Swelling    Tongue swelling      Medication List       This list is accurate as of: 09/24/15 11:59 PM.  Always use your most recent med list.               amoxicillin-clavulanate 875-125 MG tablet  Commonly known as:  AUGMENTIN  Take 1 tablet by mouth 2  (two) times daily. 2 more days from 09/16/15     aspirin 325 MG EC tablet  Take 1 tablet (325 mg total) by mouth daily.     FLUoxetine 20 MG capsule  Commonly known as:  PROZAC  Take 20 mg by mouth daily.     folic acid 1 MG tablet  Commonly known as:  FOLVITE  Take 1 tablet (1 mg total) by mouth daily.     loratadine 10 MG tablet  Commonly known as:  CLARITIN  Take 10 mg by mouth daily.     metFORMIN 500 MG tablet  Commonly known as:  GLUCOPHAGE  Take 1 tablet (500 mg total) by mouth  2 (two) times daily with a meal.     metoprolol 50 MG tablet  Commonly known as:  LOPRESSOR  Take 1 tablet (50 mg total) by mouth 2 (two) times daily.     pantoprazole 40 MG tablet  Commonly known as:  PROTONIX  Take 1 tablet (40 mg total) by mouth daily.     potassium chloride SA 20 MEQ tablet  Commonly known as:  K-DUR,KLOR-CON  Take 1 tablet (20 mEq total) by mouth daily.     pravastatin 20 MG tablet  Commonly known as:  PRAVACHOL  Take 1 tablet (20 mg total) by mouth daily after supper.        Physical Exam: Filed Vitals:   10/03/15 1040  BP: 148/61  Pulse: 51  Temp: 96.6 F (35.9 C)  Resp: 22   There is no weight on file to calculate BMI. Physical Exam  Constitutional: He is oriented to person, place, and time. He appears well-developed and well-nourished. No distress.  Obese white male seated in wheelchair in his room, accompanied by a friend who is ready to transport him home  HENT:  Head: Normocephalic and atraumatic.  Right Ear: External ear normal.  Left Ear: External ear normal.  Nose: Nose normal.  Mouth/Throat: Oropharynx is clear and moist.  Eyes: Conjunctivae and EOM are normal. Pupils are equal, round, and reactive to light.  Neck: Neck supple. No JVD present.  Cardiovascular: Intact distal pulses.   irreg irreg  Pulmonary/Chest: Effort normal and breath sounds normal. He has no rales.  Abdominal: Soft. Bowel sounds are normal. He exhibits no distension.  There is no tenderness.  Musculoskeletal: Normal range of motion.  Lymphadenopathy:    He has no cervical adenopathy.  Neurological: He is alert and oriented to person, place, and time.  Skin:  Left anterior shin with C shaped laceration with staples in place, still some bleeding, mild erythema and warmth, but no foul smell or clear infection  Psychiatric:  Flat affect    Labs reviewed: Basic Metabolic Panel:  Recent Labs  09/12/15 1559  09/12/15 2057  09/14/15 0345 09/15/15 0312 09/16/15 0319  NA  --   < > 131*  < > 132* 133* 137  K  --   < > 4.4  < > 3.8 3.8 4.5  CL  --   < > 94*  < > 101 102 106  CO2  --   < > 22  < > 25 25 26   GLUCOSE  --   < > 208*  < > 142* 146* 109*  BUN  --   < > 11  < > 10 9 11   CREATININE  --   < > 1.35*  < > 0.92 1.03 0.97  CALCIUM  --   < > 7.8*  < > 8.4* 8.6* 8.5*  MG 1.7  --   --   --   --  1.9 2.1  PHOS  --   --  4.6  --   --   --   --   < > = values in this interval not displayed. Liver Function Tests:  Recent Labs  09/13/15 0430 09/14/15 0345 09/15/15 0312  AST 118* 71* 53*  ALT 79* 68* 61  ALKPHOS 64 56 56  BILITOT 2.1* 2.0* 1.8*  PROT 5.7* 5.5* 5.2*  ALBUMIN 2.8* 2.7* 2.7*   No results for input(s): LIPASE, AMYLASE in the last 8760 hours. No results for input(s): AMMONIA in the last 8760 hours. CBC:  Recent Labs  09/12/15 2057 09/13/15 0430 09/14/15 0345 09/15/15 0312  WBC 28.4* 24.8* 16.6* 15.6*  NEUTROABS 25.9* 22.3* 14.2*  --   HGB 10.7* 10.0* 8.8* 8.6*  HCT 30.9* 29.1* 25.8* 25.5*  MCV 91.7 92.1 92.8 95.9  PLT 178 176 136* 162   Cardiac Enzymes:  Recent Labs  09/12/15 1606 09/12/15 2057 09/13/15 0430  CKTOTAL 129  --   --   TROPONINI 0.03 0.03 0.04*   BNP: Invalid input(s): POCBNP Lab Results  Component Value Date   HGBA1C 5.6 09/12/2015   Lab Results  Component Value Date   TSH 1.950 07/26/2014   No results found for: VITAMINB12 No results found for: FOLATE No results found for: IRON, TIBC,  FERRITIN  Imaging and Procedures obtained prior to SNF admission: Ct Head Wo Contrast  09/12/2015  CLINICAL DATA:  Recent fall, anticoagulated EXAM: CT HEAD WITHOUT CONTRAST TECHNIQUE: Contiguous axial images were obtained from the base of the skull through the vertex without intravenous contrast. COMPARISON:  None. FINDINGS: Bony calvarium is intact. Mucosal changes are noted within the left maxillary antrum of uncertain chronicity. Mild atrophic changes are noted commenced with the patient's given age. No findings to suggest acute hemorrhage, acute infarction or space-occupying mass lesion are noted. IMPRESSION: Mild atrophic changes without acute abnormality. Electronically Signed   By: Inez Catalina M.D.   On: 09/12/2015 18:45   Dg Chest Portable 1 View  09/12/2015  CLINICAL DATA:  Fall today and became unresponsive at hospital. EXAM: PORTABLE CHEST 1 VIEW COMPARISON:  07/26/2014 FINDINGS: Lungs are hypoinflated without focal consolidation, effusion or pneumothorax. Note that the lateral left lung base is cut off the film. Remainder of the exam is unchanged. IMPRESSION: Hypoinflation without acute cardiopulmonary disease. Electronically Signed   By: Marin Olp M.D.   On: 09/12/2015 15:05    Assessment/Plan 1. Syncope and collapse -due to #2 -fall led to laceration on left leg on mysterious object  2. VT (ventricular tachycardia) ([place]) -in context of alcoholism in pt with baseline afib -no further episodes since syncope in ED -cont potassium and avoid alcohol -keep f/u with Dr. Einar Gip  3. Alcoholic hepatitis without ascites -pt states he's not going to drink anymore b/c cardiology told him in no uncertain terms that it would kill him if he did (repeat VT)  4. Coagulopathy ([place]) -resolved with d/c alcohol and holding coumadin, but was now restarted -will need close INR monitoring and also to ensure he has not returned to drinking  5. Acute prerenal azotemia -resolved with hydration,  med holds and stopping alcohol  6. Leg laceration, left, subsequent encounter -will need sutures removed at his f/u appt--still had some bleeding today -cont dressings over top and monitor for s/s of infection  7. Chronic atrial fibrillation (HCC) -is back on coumadin per Dr. Einar Gip who he saw yesterday -for f/u INR next Wed at his PCP's coumadin clinic  8. Benign essential HTN -bp elevated today -cont to monitor on lopressor 50mg  po bid  9. Controlled type 2 diabetes mellitus without complication, without long-term current use of insulin (HCC) -cont metformin 500mg  po bid with meals  10. Barrett's esophagus with dysplasia -cont PPI and maintain close GI f/u  11. Hyperlipidemia -cont pravachol  12. Muscular deconditioning -much improved with therapy here -continue PT at home with home health   D/c home with home health PT appt with Dr. Coletta Memos 09/27/15 at 10:30 and Dr. Einar Gip in 2 wks [phone]  Rxs provided for all meds  for 30 day supply except the coumadin which Dr. Einar Gip sent to pharmacy by report  Functional status:  independent  Family/ staff Communication: discussed with social worker at [employer] and nursing staff  Labs/tests ordered:  Will need outpatient liver panel and PT/PTT/INR f/u and must f/u with Dr Einar Gip for afib and recent VT episodes  Webster Patrone L. Tommy Minichiello, D.O. [facility] [address], [place] [address] Cell Phone (Mon-Fri 8am-5pm):  [phone] On Call:  [phone] & follow prompts after 5pm & weekends Office Phone:  [phone] Office Fax:  [phone]

## 2015-10-03 ENCOUNTER — Encounter: Payer: Self-pay | Admitting: Internal Medicine

## 2015-10-21 ENCOUNTER — Other Ambulatory Visit: Payer: Self-pay | Admitting: Internal Medicine

## 2015-11-16 ENCOUNTER — Other Ambulatory Visit: Payer: Self-pay | Admitting: Internal Medicine

## 2016-01-08 ENCOUNTER — Other Ambulatory Visit: Payer: Self-pay | Admitting: Internal Medicine

## 2016-01-22 ENCOUNTER — Other Ambulatory Visit: Payer: Self-pay | Admitting: Internal Medicine

## 2016-08-06 ENCOUNTER — Other Ambulatory Visit: Payer: Self-pay | Admitting: Internal Medicine

## 2016-09-01 ENCOUNTER — Other Ambulatory Visit: Payer: Self-pay | Admitting: Internal Medicine

## 2017-05-01 ENCOUNTER — Ambulatory Visit (INDEPENDENT_AMBULATORY_CARE_PROVIDER_SITE_OTHER): Payer: MEDICARE | Admitting: Neurology

## 2017-05-01 ENCOUNTER — Encounter: Payer: Self-pay | Admitting: Neurology

## 2017-05-01 VITALS — BP 158/82 | HR 55 | Ht 71.0 in | Wt 345.0 lb

## 2017-05-01 DIAGNOSIS — R600 Localized edema: Secondary | ICD-10-CM

## 2017-05-01 DIAGNOSIS — R0683 Snoring: Secondary | ICD-10-CM

## 2017-05-01 DIAGNOSIS — R351 Nocturia: Secondary | ICD-10-CM | POA: Diagnosis not present

## 2017-05-01 DIAGNOSIS — I482 Chronic atrial fibrillation, unspecified: Secondary | ICD-10-CM

## 2017-05-01 DIAGNOSIS — R0681 Apnea, not elsewhere classified: Secondary | ICD-10-CM

## 2017-05-01 NOTE — Progress Notes (Signed)
Subjective:    Patient ID: Harinder Romas is a 76 y.o. male.  HPI     Star Age, MD, PhD [facility] [address], [address] [place], [place] [address]  Dear Dr. Coletta Memos,   I saw your patient, Terron Merfeld, upon your kind request in my neurologic clinic today for initial consultation of his sleep disorder, in particular, evaluation for OSA. The patient is unaccompanied today. As you know, Mr. Dowson is a 76 year old right-handed gentleman with an underlying medical history of alcoholism, history of V. tach, history of hyponatremia, A. fib, with recent restart of Coumadin, hypertension, type 2 diabetes, reflux disease, hyperlipidemia, chronic right sided CHF and cor pulmonale, and morbid obesity, who reports snoring and excessive daytime somnolence. He has never had a sleep study. I reviewed your office note from 03/14/2017. He has also been told that he quits breathing while asleep. His Epworth sleepiness score is 5/24, fatigue score is 42/63. He quit smoking in the 80s. He is retired from working for the railroad for 40 years. He has 4 grandchildren. He lives alone, wife passed away about 2 years ago. He has a dog. Bedtime is typically at or after 12:30 AM and wakeup time around 7:30 AM. He has nocturia nightly, about 2 or 3 times per average night, denies morning headaches, denies a family history of obstructive sleep apnea. He has one sister. He drinks caffeine in the form of coffee, one or 2 cups per day, typically no sodas. He drinks alcohol in the form of beer, usually one to 3 per day, sometimes more. He has reduced his alcohol consumption but not quit altogether.   His Past Medical History Is Significant For: Past Medical History:  Diagnosis Date  . Adynamic ileus ([place])   . Anxiety   . Arthritis   . Atrial fibrillation ([place]) CARDIOLOGIST-- DR Einar Gip  . Barrett esophagus   . Cholelithiasis   . Chronic right-sided CHF (congestive heart failure) ([place])    . Colon polyps    TUBULAR ADENOMA (2) & HYPERPLASTIC POLYP  . Cor pulmonale, chronic ([place])   . Diabetes mellitus    NIDDM  . GERD (gastroesophageal reflux disease)   . GI bleed   . Hypertension    benign essential hypertension  . Long-term (current) use of anticoagulants   . Morbid obesity ([place])   . OSA (obstructive sleep apnea) 08/29/2013  . OSA (obstructive sleep apnea)   . Pulmonary hypertension ([facility]) MODERATE  . Pure hypercholesterolemia   . Shortness of breath    with exertion   . Sleep apnea    no CPAP  . Varicose veins of legs     His Past Surgical History Is Significant For: Past Surgical History:  Procedure Laterality Date  . benign mole removal from nose     . CARDIOVASCULAR STRESS TEST  07-19-2012  DR Einar Gip   PROMINENT DIAPHRAGMATIC ATTENUATION/ NORMAL LVEF/ LOW RISK STUDY  . COLONOSCOPY    . ESOPHAGOGASTRODUODENOSCOPY (EGD) WITH PROPOFOL N/A 07/28/2014   Procedure: ESOPHAGOGASTRODUODENOSCOPY (EGD) WITH PROPOFOL;  Surgeon: Irene Shipper, MD;  Location: [facility];  Service: Endoscopy;  Laterality: N/A;  . KNEE ARTHROSCOPY WITH MEDIAL MENISECTOMY Left 04/23/2013   Procedure: LEFT KNEE ARTHROSCOPY WITH PARTIAL MEDIAL MENISECTOMY;  Surgeon: Magnus Sinning, MD;  Location: [facility];  Service: Orthopedics;  Laterality: Left;  . MOUTH SURGERY    . TRANSTHORACIC ECHOCARDIOGRAM  05-16-2012   LOW NORMAL LVEF/ MOD. RV/ MILD HYPOKINESIS/ MOD. PULMONARY HTN/ CHRONIC COR  PUMONALE/ NO SIG. CHANGE FROM 12-01-2010    His Family History Is Significant For: Family History  Problem Relation Age of Onset  . Heart disease Mother   . Hypertension Father   . Cancer Father   . Throat cancer Paternal Uncle   . Colon cancer Neg Hx   . Prostate cancer Neg Hx   . Rectal cancer Neg Hx   . Stomach cancer Neg Hx     His Social History Is Significant For: Social History   Social History  . Marital status: Married    Spouse name: N/A  . Number of children: N/A  . Years of  education: N/A   Occupational History  . retired    Social History Main Topics  . Smoking status: Former Smoker    Packs/day: 1.00    Types: Cigarettes    Quit date: 06/28/1987  . Smokeless tobacco: Never Used  . Alcohol use 0.0 oz/week     Comment: pint daily  . Drug use: No  . Sexual activity: Not Asked   Other Topics Concern  . None   Social History Narrative  . None    His Allergies Are:  Allergies  Allergen Reactions  . Lisinopril Swelling    Tongue swelling  :   His Current Medications Are:  Outpatient Encounter Prescriptions as of 05/01/2017  Medication Sig  . ALPRAZolam (XANAX) 0.5 MG tablet Take 0.5 mg by mouth at bedtime as needed for anxiety.  Marland Kitchen amLODipine (NORVASC) 5 MG tablet Take 5 mg by mouth daily.  Marland Kitchen FLUoxetine (PROZAC) 40 MG capsule Take 40 mg by mouth daily.  . folic acid (FOLVITE) 1 MG tablet Take 1 tablet (1 mg total) by mouth daily.  . furosemide (LASIX) 40 MG tablet Take 40 mg by mouth 2 (two) times daily.  Marland Kitchen loratadine (CLARITIN) 10 MG tablet TAKE 1 TABLET BY MOUTH EVERY DAY  . losartan (COZAAR) 100 MG tablet Take 100 mg by mouth daily.  . meloxicam (MOBIC) 15 MG tablet Take 15 mg by mouth daily.  . metFORMIN (GLUCOPHAGE) 500 MG tablet Take 1 tablet (500 mg total) by mouth 2 (two) times daily with a meal. (Patient taking differently: Take 500 mg by mouth daily with breakfast. )  . pantoprazole (PROTONIX) 40 MG tablet Take 1 tablet (40 mg total) by mouth daily.  . potassium chloride SA (K-DUR,KLOR-CON) 20 MEQ tablet Take 1 tablet (20 mEq total) by mouth daily. (Patient taking differently: Take 20 mEq by mouth 2 (two) times daily. )  . pravastatin (PRAVACHOL) 20 MG tablet Take 1 tablet (20 mg total) by mouth daily after supper.  . sildenafil (VIAGRA) 100 MG tablet Take 100 mg by mouth daily as needed for erectile dysfunction.  Marland Kitchen warfarin (COUMADIN) 5 MG tablet Take 5 mg by mouth daily.  . [DISCONTINUED] aspirin EC 325 MG EC tablet Take 1 tablet (325  mg total) by mouth daily.  . [DISCONTINUED] ferrous sulfate 325 (65 FE) MG tablet Take 325 mg by mouth daily with breakfast.  . [DISCONTINUED] metoprolol (LOPRESSOR) 50 MG tablet Take 1 tablet (50 mg total) by mouth 2 (two) times daily.   No facility-administered encounter medications on file as of 05/01/2017.   :  Review of Systems:  Out of a complete 14 point review of systems, all are reviewed and negative with the exception of these symptoms as listed below:  Review of Systems  Neurological:       Pt presents today to discuss his sleep. Pt is  sleepy during the day. Pt's wife is now deceased, but she used to complain that pt would stop breathing in his sleep. Pt wakes himself up snoring at times. Pt has never had a sleep study.   Epworth Sleepiness Scale 0= would never doze 1= slight chance of dozing 2= moderate chance of dozing 3= high chance of dozing  Sitting and reading:  0 Watching TV: 1 Sitting inactive in a public place (ex. Theater or meeting): 1 As a passenger in a car for an hour without a break: 0 Lying down to rest in the afternoon: 2 Sitting and talking to someone: 0 Sitting quietly after lunch (no alcohol): 1 In a car, while stopped in traffic: 0 Total: 5     Objective:  Neurologic Exam  Physical Exam Physical Examination:   Vitals:   05/01/17 1322  BP: (!) 158/82  Pulse: (!) 55   General Examination: The patient is a very pleasant 76 y.o. male in no acute distress. He appears well-developed and well-nourished and well groomed.   HEENT: Normocephalic, atraumatic, pupils are equal, round and reactive to light and accommodation. He is status post cataract repairs. Funduscopic exam is normal with sharp disc margins noted. Extraocular tracking is good without limitation to gaze excursion or nystagmus noted. Normal smooth pursuit is noted. Hearing is grossly intact. Face is symmetric with normal facial animation and normal facial sensation. Speech is clear with  no dysarthria noted. There is no hypophonia. There is no lip, neck/head, jaw or voice tremor. Neck is supple with full range of passive and active motion. There are no carotid bruits on auscultation. Oropharynx exam reveals: mild mouth dryness, adequate dental hygiene with many missing teeth, and moderate airway crowding, due to larger tongue, redundant soft palate and larger uvula, and tonsils. Mallampati is class III. Tongue protrudes centrally and palate elevates symmetrically. Tonsils are 1+ in size. Neck size is 21 inches. He has a Mild overbite. Nasal inspection reveals no significant nasal mucosal bogginess or redness and no septal deviation, narrow nasal air space.   Chest: Clear to auscultation without wheezing, rhonchi or crackles noted.  Heart: S1+S2+0, regular and normal without murmurs, rubs or gallops noted.   Abdomen: Soft, non-tender and non-distended with normal bowel sounds appreciated on auscultation.  Extremities: There is 3+ pitting edema in the distal lower extremities bilaterally. Pedal pulses are intact.  Skin: Warm and dry without trophic changes noted. There are no varicose veins but thicker skin in the distal lower extremities, below knees, worse around the ankles and feet. chronic stasis-like changes.   Musculoskeletal: exam reveals no obvious joint deformities, tenderness or joint swelling or erythema.   Neurologically:  Mental status: The patient is awake, alert and oriented in all 4 spheres. His immediate and remote memory, attention, language skills and fund of knowledge are appropriate. There is no evidence of aphasia, agnosia, apraxia or anomia. Speech is clear with normal prosody and enunciation. Thought process is linear. Mood is normal and affect is normal.  Cranial nerves II - XII are as described above under HEENT exam. In addition: shoulder shrug is normal with equal shoulder height noted. Motor exam: Normal bulk, strength and tone is noted. There is no drift,  tremor or rebound. Romberg is negative. Reflexes are 1+ in the upper extremities and unable to elicit in the lower extremities due to swelling. Fine motor skills and coordination: intact with normal finger taps, normal hand movements, normal rapid alternating patting, normal foot taps and normal foot agility.  Cerebellar testing: No dysmetria or intention tremor on finger to nose testing. Heel to shin is unremarkable bilaterally. There is no truncal or gait ataxia.  Sensory exam: intact to light touch in the upper and lower extremities.  Gait, station and balance: He stands with difficulty. No veering to one side is noted. No leaning to one side is noted. Posture is age-appropriate and stance is wide-based. He walks slowly. Tandem walk is not possible. Balance is mildly impaired.   Assessment and Plan:   In summary, Josmar Messimer is a very pleasant 77 y.o.-year old male with an underlying medical history of alcoholism, history of V. tach, history of hyponatremia, A. fib, with recent restart of Coumadin, hypertension, type 2 diabetes, reflux disease, hyperlipidemia, chronic right sided CHF and cor pulmonale, and morbid obesity, whose history and physical exam are in keeping with obstructive sleep apnea (OSA). I had a long chat with the patient about my findings and the diagnosis of OSA, its prognosis and treatment options. We talked about medical treatments, surgical interventions and non-pharmacological approaches. I explained in particular the risks and ramifications of untreated moderate to severe OSA, especially with respect to developing cardiovascular disease down the Road, including congestive heart failure, difficult to treat hypertension, cardiac arrhythmias, or stroke. Even type 2 diabetes has, in part, been linked to untreated OSA. Symptoms of untreated OSA include daytime sleepiness, memory problems, mood irritability and mood disorder such as depression and anxiety, lack of energy, as well as  recurrent headaches, especially morning headaches. We talked about alcohol cessation and trying to maintain a healthy lifestyle in general, as well as the importance of weight control. I encouraged the patient to eat healthy, exercise daily and keep well hydrated, to keep a scheduled bedtime and wake time routine, to not skip any meals and eat healthy snacks in between meals. I advised the patient not to drive when feeling sleepy. I recommended the following at this time: sleep study with potential positive airway pressure titration. (We will score hypopneas at 4%).   I explained the sleep test procedure to the patient and also outlined possible surgical and non-surgical treatment options of OSA, including the use of a custom-made dental device (which would require a referral to a specialist dentist or oral surgeon), upper airway surgical options, such as pillar implants, radiofrequency surgery, tongue base surgery, and UPPP (which would involve a referral to an ENT surgeon). Rarely, jaw surgery such as mandibular advancement may be considered.  I also explained the CPAP treatment option to the patient, who indicated that he would be willing to try CPAP if the need arises. I explained the importance of being compliant with PAP treatment, not only for insurance purposes but primarily to improve His symptoms, and for the patient's long term health benefit, including to reduce His cardiovascular risks. I answered all his questions today and the patient was in agreement. I would like to see him back after the sleep study is completed and encouraged him to call with any interim questions, concerns, problems or updates.   Thank you very much for allowing me to participate in the care of this nice patient. If I can be of any further assistance to you please do not hesitate to call me at [phone].  Sincerely,   Star Age, MD, PhD

## 2017-05-01 NOTE — Patient Instructions (Signed)

## 2017-05-27 ENCOUNTER — Ambulatory Visit (INDEPENDENT_AMBULATORY_CARE_PROVIDER_SITE_OTHER): Payer: MEDICARE | Admitting: Neurology

## 2017-05-27 DIAGNOSIS — G4720 Circadian rhythm sleep disorder, unspecified type: Secondary | ICD-10-CM

## 2017-05-27 DIAGNOSIS — G4733 Obstructive sleep apnea (adult) (pediatric): Secondary | ICD-10-CM | POA: Diagnosis not present

## 2017-05-27 DIAGNOSIS — I482 Chronic atrial fibrillation, unspecified: Secondary | ICD-10-CM

## 2017-05-27 DIAGNOSIS — G4761 Periodic limb movement disorder: Secondary | ICD-10-CM

## 2017-05-31 ENCOUNTER — Telehealth: Payer: Self-pay

## 2017-05-31 NOTE — Telephone Encounter (Signed)
-----   Message from Star Age, MD sent at 05/31/2017  8:57 AM EDT ----- Patient referred by Dr. Coletta Memos, seen by me on 05/01/17, diagnostic PSG on 05/27/17.    Please call and notify the patient that the recent sleep study did confirm the diagnosis of severe obstructive sleep apnea, with a total AHI of 40.4/hour, REM AHI of 42.5/hour, and O2 nadir of 77%; there was significant time below 88% saturation, in keeping with nocturnal hypoxemia. The patient is strongly encouraged to lose weight and to avoid sedating medications, refrain from drinking alcohol. In addition, I recommend treatment for this in the form of CPAP. This will require a repeat sleep study for proper titration and mask fitting. Please explain to patient and arrange for a CPAP titration study. I have placed an order in the chart. Thanks, and please route to [facility] for scheduling next sleep study.  Star Age, MD, PhD [facility])

## 2017-05-31 NOTE — Progress Notes (Signed)
Patient referred by Dr. Coletta Memos, seen by me on 05/01/17, diagnostic PSG on 05/27/17.    Please call and notify the patient that the recent sleep study did confirm the diagnosis of severe obstructive sleep apnea, with a total AHI of 40.4/hour, REM AHI of 42.5/hour, and O2 nadir of 77%; there was significant time below 88% saturation, in keeping with nocturnal hypoxemia. The patient is strongly encouraged to lose weight and to avoid sedating medications, refrain from drinking alcohol. In addition, I recommend treatment for this in the form of CPAP. This will require a repeat sleep study for proper titration and mask fitting. Please explain to patient and arrange for a CPAP titration study. I have placed an order in the chart. Thanks, and please route to [facility] for scheduling next sleep study.  Star Age, MD, PhD [facility])

## 2017-05-31 NOTE — Procedures (Signed)
PATIENT'S NAME:  Edwin [name], Edwin [name] DOB:      1941-04-26      MR#:    [id number]     DATE OF RECORDING: 05/27/2017 REFERRING M.D.:  Bernerd Limbo, MD Study Performed:   Baseline Polysomnogram HISTORY: 76 year old man with a history of V. tach, hyponatremia, A. fib, hypertension, type 2 diabetes, reflux disease, hyperlipidemia, CHF, cor pulmonale, and morbid obesity, who reports snoring and excessive daytime somnolence. Epworth Sleepiness Score is 5 points. BMI of 48.1 kg/m2. The patient's neck circumference measured 21 inches.  CURRENT MEDICATIONS: Xanax, Norvasc, Prozac, Folvite, Lasix, Claritin, Coozar, Mobic, Glucophage, Protonix, K-Dur, Pravachol, Viagra, Coumadin   PROCEDURE:  This is a multichannel digital polysomnogram utilizing the Somnostar 11.2 system.  Electrodes and sensors were applied and monitored per AASM Specifications.   EEG, EOG, Chin and Limb EMG, were sampled at 200 Hz.  ECG, Snore and Nasal Pressure, Thermal Airflow, Respiratory Effort, CPAP Flow and Pressure, Oximetry was sampled at 50 Hz. Digital video and audio were recorded.      BASELINE STUDY Lights Out was at 23:15 (patient did not wish to start study before 11 PM per tech) and Lights On at 05:16.  Total recording time (TRT) was 361 minutes, with a total sleep time (TST) of  337.5 minutes.   The patient's sleep latency was 21.5 minutes. REM latency was 142.5 minutes, which is mildly delayed.  The sleep efficiency was 93.5 %.     SLEEP ARCHITECTURE: WASO (Wake after sleep onset) was 7 minutes.  There were 20 minutes in Stage N1, 190.5 minutes Stage N2, 0 minutes Stage N3 and 127 minutes in Stage REM.  The percentage of Stage N1 was 5.9%, Stage N2 was 56.4%, Stage N3 was absent and Stage R (REM sleep) was 37.6%, which is increased.  The arousals were noted as: 16 were spontaneous, 0 were associated with PLMs, 100 were associated with respiratory events. Audio and video analysis did not show any abnormal or unusual movements,  behaviors, phonations or vocalizations.  The patient took no bathroom breaks. Moderate to loud snoring was noted.  EKG was irregular, in keeping with A fib.  RESPIRATORY ANALYSIS:  There were a total of 227 respiratory events:  136 obstructive apneas, 0 central apneas and 0 mixed apneas with a total of 136 apneas and an apnea index (AI) of 24.2 /hour. There were 91 hypopneas with a hypopnea index of 16.2 /hour. The patient also had 0 respiratory event related arousals (RERAs).      The total APNEA/HYPOPNEA INDEX (AHI) was 40.4/hour and the total RESPIRATORY DISTURBANCE INDEX was 40.4 /hour.  90 events occurred in REM sleep and 120 events in NREM. The REM AHI was 42.5 /hour, versus a non-REM AHI of 39.. The patient spent 11.5 minutes of total sleep time in the supine position and 326 minutes in non-supine.. The supine AHI was 20.9 versus a non-supine AHI of 41.1.  OXYGEN SATURATION & C02:  The Wake baseline 02 saturation was 92%, with the lowest being 77%. Time spent below 89% saturation equaled 222 minutes.  PERIODIC LIMB MOVEMENTS: The patient had a total of 132 Periodic Limb Movements.  The Periodic Limb Movement (PLM) index was 23.5 and the PLM Arousal index was 0/hour. Post-study, the patient indicated that sleep was better than usual.   IMPRESSION: 1. Obstructive Sleep Apnea (OSA) 2. Periodic Limb Movement Disorder (PLMD) 3. Atrial fibrillation 4. Dysfunctions associated with sleep stages or arousal from sleep  RECOMMENDATIONS: 1. This study demonstrates severe obstructive sleep  apnea, with a total AHI of 40.4/hour, REM AHI of 42.5/hour, and O2 nadir of 77%; there was significant time below 88% saturation, in keeping with nocturnal hypoxemia. The patient is strongly encouraged to lose weight and to avoid sedating medications, refrain from drinking alcohol. Treatment with positive airway pressure in the form of CPAP is recommended. This will require a full night titration study to optimize  therapy. Other treatment options may include avoidance of supine sleep position along with weight loss, upper airway or jaw surgery in selected patients or the use of an oral appliance in certain patients. ENT evaluation and/or consultation with a maxillofacial surgeon or dentist may be feasible in some instances.    2. Please note that untreated obstructive sleep apnea carries additional perioperative morbidity. Patients with significant obstructive sleep apnea should receive perioperative PAP therapy and the surgeons and particularly the anesthesiologist should be informed of the diagnosis and the severity of the sleep disordered breathing. 3. This study shows sleep fragmentation and abnormal sleep stage percentages; these are nonspecific findings and per se do not signify an intrinsic sleep disorder or a cause for the patient's sleep-related symptoms. Causes include (but are not limited to) the first night effect of the sleep study, circadian rhythm disturbances, medication effect or an underlying mood disorder or medical problem.  4. The study showed an irregular EKG in keeping with his known diagnosis of atrial fibrillation.  5. Mild PLMs (periodic limb movements of sleep) were noted during this study with no associated arousals; clinical correlation is recommended. Medication effect from the antidepressant medication should be considered.  6. The patient should be cautioned not to drive, work at heights, or operate dangerous or heavy equipment when tired or sleepy. Review and reiteration of good sleep hygiene measures should be pursued with any patient. 7. The patient will be seen in follow-up by Dr. Rexene Alberts at [facility] for discussion of the test results and further management strategies. The referring provider will be notified of the test result.  I certify that I have reviewed the entire raw data recording prior to the issuance of this report in accordance with the Standards of Accreditation of the American  Academy of Sleep Medicine (AASM)  Star Age, MD, PhD Diplomat, American Board of Psychiatry and Neurology (Neurology and Sleep Medicine)

## 2017-05-31 NOTE — Addendum Note (Signed)
Addended by: Star Age on: 05/31/2017 08:57 AM   Modules accepted: Orders

## 2017-05-31 NOTE — Telephone Encounter (Signed)
I called pt. I advised pt that Dr. Rexene Alberts reviewed their sleep study results and found that pt has severe osa with nocturnal hypoxemia. Dr. Rexene Alberts recommends treatment for this in the form of a cpap. Dr. Rexene Alberts recommends that pt return for a repeat sleep study in order to properly titrate the cpap and ensure a good mask fit. Pt is agreeable to returning for a titration study. I advised pt that our sleep lab will file with pt's insurance and call pt to schedule the sleep study when we hear back from the pt's insurance regarding coverage of this sleep study. I encouraged pt to continue pursuing weight loss,  Pt verbalized understanding of results. Pt had no questions at this time but was encouraged to call back if questions arise.

## 2017-07-03 ENCOUNTER — Ambulatory Visit (INDEPENDENT_AMBULATORY_CARE_PROVIDER_SITE_OTHER): Payer: MEDICARE | Admitting: Neurology

## 2017-07-03 DIAGNOSIS — I482 Chronic atrial fibrillation, unspecified: Secondary | ICD-10-CM

## 2017-07-03 DIAGNOSIS — G4733 Obstructive sleep apnea (adult) (pediatric): Secondary | ICD-10-CM | POA: Diagnosis not present

## 2017-07-03 DIAGNOSIS — G4761 Periodic limb movement disorder: Secondary | ICD-10-CM

## 2017-07-03 DIAGNOSIS — G4720 Circadian rhythm sleep disorder, unspecified type: Secondary | ICD-10-CM

## 2017-07-06 NOTE — Procedures (Signed)
PATIENT'S NAME:  Pratt, Bress DOB:      1941-10-21      MR#:    [id number]     DATE OF RECORDING: 07/03/2017 REFERRING M.D.:  Bernerd Limbo, MD Study Performed:   CPAP  Titration HISTORY:  76 year old man with a history of V. tach, hyponatremia, A. fib, hypertension, type 2 diabetes, reflux disease, hyperlipidemia, CHF, cor pulmonale, and morbid obesity, who presents for a CPAP titration. His baseline sleep study from 05/27/17 showed an AHI of 40.4/hour, REM AHI of 42.5/hour, and O2 nadir of 77%; there was significant time below 88% saturation. Epworth Sleepiness Score is 5 points. BMI of 48.1 kg/m2. The patient's neck circumference measured 21 inches.  CURRENT MEDICATIONS: Xanax, Norvasc, Prozac, Folvite, Lasix, Claritin, Coozar, Mobic, Glucophage, Protonix, K-Dur, Pravachol, Viagra, Coumadin.   PROCEDURE:  This is a multichannel digital polysomnogram utilizing the SomnoStar 11.2 system.  Electrodes and sensors were applied and monitored per AASM Specifications.   EEG, EOG, Chin and Limb EMG, were sampled at 200 Hz.  ECG, Snore and Nasal Pressure, Thermal Airflow, Respiratory Effort, CPAP Flow and Pressure, Oximetry was sampled at 50 Hz. Digital video and audio were recorded.      The patient was fitted with small nasal pillows. CPAP was initiated at 7 cmH20 with heated humidity per AASM standards and pressure was advanced to 10 cmH20 because of hypopneas, apneas and desaturations.  At a PAP pressure of 10 cmH20, there was a reduction of the AHI to 0 with O2 nadir of 90%, and supine NREM sleep achieved.   Lights Out was at 22:24 and Lights On at 05:02. Total recording time (TRT) was 339.5 minutes, with a total sleep time (TST) of 231 minutes. The patient's sleep latency was 11.5 minutes with 64 minutes of wake time after sleep onset. REM latency was 45 minutes, which is reduced. The sleep efficiency was 68.%.    SLEEP ARCHITECTURE: WASO (Wake after sleep onset) was 45.5 minutes. There were 14 minutes  in Stage N1, 176 minutes Stage N2, 44 minutes Stage N3 and 44 minutes in Stage REM. The percentage of Stage N1 was 5.%, Stage N2 was 63.3%, which is increased, Stage N3 was 15.8% and Stage R (REM sleep) was 15.8%, which is mildly reduced. The arousals were noted as: 23 were spontaneous, 9 were associated with PLMs, 14 were associated with respiratory events.   [x] Audio and video analysis did not show any abnormal or unusual movements, behaviors, phonations or vocalizations. [x] The patient took no bathroom breaks. [x]  EKG was in keeping with A fib with PVCs.  RESPIRATORY ANALYSIS:  There was a total of 30 respiratory events: 5 obstructive apneas, 0 central apneas and 0 mixed apneas with a total of 5 apneas and an apnea index (AI) of 1.3 /hour. There were 25 hypopneas with a hypopnea index of 6.5/hour. The patient also had 2 respiratory event related arousals (RERAs).      The total APNEA/HYPOPNEA INDEX  (AHI) was 7.8 /hour and the total RESPIRATORY DISTURBANCE INDEX was 8.3 .hour  15 events occurred in REM sleep and 15 events in NREM. The REM AHI was 20.5 /hour versus a non-REM AHI of 4.8 /hour.  The patient spent 0 minutes of total sleep time in the supine position and 278 minutes in non-supine. The supine AHI was 0.0, versus a non-supine AHI of 7.8.  OXYGEN SATURATION & C02:  The baseline 02 saturation was 0%, with the lowest being 85%. Time spent below 89% saturation equaled 24 minutes.  PERIODIC LIMB MOVEMENTS: The patient had a total of 185 Periodic Limb Movements. The Periodic Limb Movement (PLM) index was 48.1 and the PLM Arousal index was 2.1 /hour.  [x]   Post-study, the patient indicated that sleep was better than usual.   IMPRESSION: 1. Obstructive Sleep Apnea (OSA) 2. Periodic Limb Movement Disorder (PLMD) 3. Atrial fibrillation, PVCs 4. Dysfunctions associated with sleep stages or arousal from sleep   RECOMMENDATIONS: 1. This study demonstrates resolution of the patient's obstructive  sleep apnea with CPAP therapy. I will, therefore, start the patient on home CPAP treatment at a pressure of 10 cm via small nasal pillows with heated humidity. The patient should be reminded to be fully compliant with PAP therapy to improve sleep related symptoms and decrease long term cardiovascular risks. The patient should be reminded, that it may take up to 3 months to get fully used to using PAP with all planned sleep. The earlier full compliance is achieved, the better long term compliance tends to be. Please note that untreated obstructive sleep apnea carries additional perioperative morbidity. Patients with significant obstructive sleep apnea should receive perioperative PAP therapy and the surgeons and particularly the anesthesiologist should be informed of the diagnosis and the severity of the sleep disordered breathing. 2. This study shows sleep fragmentation and abnormal sleep stage percentages; these are nonspecific findings and per se do not signify an intrinsic sleep disorder or a cause for the patient's sleep-related symptoms. Causes include (but are not limited to) the first night effect of the sleep study, circadian rhythm disturbances, medication effect or an underlying mood disorder or medical problem.  3. The study showed an irregular EKG in keeping with his known diagnosis of atrial fibrillation with PVCs noted.  4. Moderate PLMs (periodic limb movements of sleep) were noted during this study with minimal arousals; clinical correlation is recommended. Medication effect from the antidepressant medication should be considered.  5. The patient should be cautioned not to drive, work at heights, or operate dangerous or heavy equipment when tired or sleepy. Review and reiteration of good sleep hygiene measures should be pursued with any patient. 6. The patient will be seen in follow-up by Dr. Rexene Alberts at [facility] for discussion of the test results and further management strategies. The referring  provider will be notified of the test result.   I certify that I have reviewed the entire raw data recording prior to the issuance of this report in accordance with the Standards of Accreditation of the American Academy of Sleep Medicine (AASM)   Star Age, MD, PhD Diplomat, American Board of Psychiatry and Neurology (Neurology and Sleep Medicine)

## 2017-07-06 NOTE — Addendum Note (Signed)
Addended by: Star Age on: 07/06/2017 12:47 PM   Modules accepted: Orders

## 2017-07-06 NOTE — Progress Notes (Signed)
Patient referred by Dr. Coletta Memos, seen by me on 05/01/17, diagnostic PSG on 05/27/17, CPAP Study on 07/03/17.  Please call and inform patient that I have entered an order for treatment with positive airway pressure (PAP) treatment of obstructive sleep apnea (OSA). He did well during the latest sleep study with CPAP. We will, therefore, arrange for a machine for home use through a DME (durable medical equipment) company of His choice; and I will see the patient back in follow-up in about 10 weeks. Please also explain to the patient that I will be looking out for compliance data, which can be downloaded from the machine (stored on an SD card, that is inserted in the machine) or via remote access through a modem, that is built into the machine. At the time of the followup appointment we will discuss sleep study results and how it is going with PAP treatment at home. Please advise patient to bring His machine at the time of the first FU visit, even though this is cumbersome. Bringing the machine for every visit after that will likely not be needed, but often helps for the first visit to troubleshoot if needed. Please re-enforce the importance of compliance with treatment and the need for [place] to monitor compliance data - often an insurance requirement and actually good feedback for the patient as far as how they are doing.  Also remind patient, that any interim PAP machine or mask issues should be first addressed with the DME company, as they can often help better with technical and mask fit issues. Please ask if patient has a preference regarding DME company.  Please also make sure, the patient has a follow-up appointment with me in about 10 weeks from the setup date, thanks.  Once you have spoken to the patient - and faxed/routed report to PCP and referring MD (if other than PCP), you can close this encounter, thanks,   Star Age, MD, PhD [facility] ([place])

## 2017-07-10 ENCOUNTER — Telehealth: Payer: Self-pay

## 2017-07-10 NOTE — Telephone Encounter (Incomplete)
Spoke with patient and gave him his sleep study results. Explained that an order would be sent to [facility] and he would hear from them after insurance has been verified. I explained that they would go over the use of machine and cleaning it. I scheduled a floow-up appoitnment

## 2017-07-10 NOTE — Telephone Encounter (Signed)
Sent pt orders to [facility]. Pt has scheduled a follow up apt for Sep 06 2017 at 1:00 pm. Will send a my chart message with all this information

## 2017-07-10 NOTE — Telephone Encounter (Signed)
-----   Message from Star Age, MD sent at 07/06/2017 12:47 PM EDT ----- Patient referred by Dr. Coletta Memos, seen by me on 05/01/17, diagnostic PSG on 05/27/17, CPAP Study on 07/03/17.  Please call and inform patient that I have entered an order for treatment with positive airway pressure (PAP) treatment of obstructive sleep apnea (OSA). He did well during the latest sleep study with CPAP. We will, therefore, arrange for a machine for home use through a DME (durable medical equipment) company of His choice; and I will see the patient back in follow-up in about 10 weeks. Please also explain to the patient that I will be looking out for compliance data, which can be downloaded from the machine (stored on an SD card, that is inserted in the machine) or via remote access through a modem, that is built into the machine. At the time of the followup appointment we will discuss sleep study results and how it is going with PAP treatment at home. Please advise patient to bring His machine at the time of the first FU visit, even though this is cumbersome. Bringing the machine for every visit after that will likely not be needed, but often helps for the first visit to troubleshoot if needed. Please re-enforce the importance of compliance with treatment and the need for [place] to monitor compliance data - often an insurance requirement and actually good feedback for the patient as far as how they are doing.  Also remind patient, that any interim PAP machine or mask issues should be first addressed with the DME company, as they can often help better with technical and mask fit issues. Please ask if patient has a preference regarding DME company.  Please also make sure, the patient has a follow-up appointment with me in about 10 weeks from the setup date, thanks.  Once you have spoken to the patient - and faxed/routed report to PCP and referring MD (if other than PCP), you can close this encounter, thanks,    Star Age, MD,  PhD [facility] ([place])

## 2017-09-04 ENCOUNTER — Encounter: Payer: Self-pay | Admitting: Neurology

## 2017-09-06 ENCOUNTER — Ambulatory Visit (INDEPENDENT_AMBULATORY_CARE_PROVIDER_SITE_OTHER): Payer: MEDICARE | Admitting: Neurology

## 2017-09-06 ENCOUNTER — Encounter: Payer: Self-pay | Admitting: Neurology

## 2017-09-06 VITALS — BP 150/80 | HR 54 | Ht 71.0 in | Wt 357.5 lb

## 2017-09-06 DIAGNOSIS — G4733 Obstructive sleep apnea (adult) (pediatric): Secondary | ICD-10-CM | POA: Diagnosis not present

## 2017-09-06 DIAGNOSIS — I482 Chronic atrial fibrillation, unspecified: Secondary | ICD-10-CM

## 2017-09-06 DIAGNOSIS — R600 Localized edema: Secondary | ICD-10-CM

## 2017-09-06 NOTE — Patient Instructions (Addendum)
Unfortunately, you are currently not using your CPAP well enough. Please do not skip any nights and put the mask on, when it comes off at night.  Please continue using your CPAP regularly. While your insurance requires that you use CPAP at least 4 hours each night on 70% of the nights, I recommend, that you not skip any nights and use it throughout the night if you can. Getting used to CPAP and staying with the treatment long term does take time and patience and discipline. Untreated obstructive sleep apnea when it is moderate to severe can have an adverse impact on cardiovascular health and raise the risk for heart disease, arrhythmias, hypertension, congestive heart failure, stroke and diabetes. Untreated obstructive sleep apnea causes sleep disruption, nonrestorative sleep, and sleep deprivation. This can have an impact on your day to day functioning and cause daytime sleepiness and impairment of cognitive function, memory loss, mood disturbance, and problems focussing. Using CPAP regularly can improve these symptoms. Please remember, that you do have severe obstructive sleep apnea.  I would like for you to follow up with one of our practitioners in about 6 weeks.  Please call [facility] for additional help with mask fitting.

## 2017-09-06 NOTE — Progress Notes (Signed)
Subjective:    [name] ID: Edwin [name] is a 76 y.o. male.  HPI     Interim history:   Edwin [name] is a 76 year old right-handed Edwin [name] with an underlying medical history of alcoholism, history of V. tach, history of hyponatremia, A. fib, with recent restart of Coumadin, hypertension, type 2 diabetes, reflux disease, hyperlipidemia, chronic right sided CHF and cor pulmonale, and morbid obesity, who presents for follow-up consultation of his obstructive sleep apnea, after her recent sleep study testing. Edwin [name] is unaccompanied today. I first met him on 05/01/2017 at Edwin request of his primary care physician, at which time Edwin [name] reported snoring and excessive daytime somnolence. I asked him to proceed with sleep study testing. Edwin [name] had a baseline sleep study, followed by a CPAP titration study. I went over his test results with him in detail today. Baseline sleep study from 05/27/2017 showed a sleep efficiency of 93.5%, sleep latency of 21.5 minutes, REM latency of 142.5 minutes. Edwin [name] had absence of slow-wave sleep and REM sleep was elevated at 37.6%. Total AHI was 40.4 per hour, REM AHI 42.5 per hour, supine AHI 20.9 per hour. Average oxygen saturation was 92%, nadir was 77%. Time below 89% saturation was 222 minutes for Edwin night. Edwin [name] had mild PLMS with an index of 23.5 per hour, without associated arousals. EKG showed A. fib. Based on his medical history and his test results I suggested Edwin [name] return for a full night CPAP titration study. Edwin [name] had this on 07/03/2017. Sleep efficiency was 68%, sleep latency 11.5 minutes, REM latency 45 minutes. Wake after sleep onset was 45.5 minutes. EKG showed A. fib and PVCs. Edwin [name] was fitted with a small nasal pillows interface and CPAP was titrated from 7 cm to 10 cm. AHI was 0 per hour on Edwin final pressure with supine non-REM sleep achieved an O2 nadir of 90%. Based on his test results I prescribed CPAP therapy for home use.  Today, 09/06/2017 (all dictated new, as well as  above notes, some dictation done in note pad or Word, outside of chart, may appear as copied):   I reviewed his CPAP compliance data from 08/02/2017 through 09/04/2017 which is a total of 34 days, during which time Edwin [name] used his CPAP 24 days only with percent used days greater than 4 hours at 0%, indicating poor compliance with an average usage for days on treatment of one hour and 49 minutes only, residual AHI suboptimal at 7.2 per hour, leak acceptable with Edwin 95th percentile at 15.7 L/m on a pressure of 10 cm with EPR of 3. Residual AHI is obstructive in nature. Edwin [name] reports some improvement at times with daytime energy. Edwin [name] is willing to continue and to work on compliance.   Edwin [name]'s allergies, current medications, family history, past medical history, past social history, past surgical history and problem list were reviewed and updated as appropriate.   Previously (copied from previous notes for reference):   05/01/2017: (Edwin [name]) reports snoring and excessive daytime somnolence. Edwin [name] has never had a sleep study. I reviewed your office note from 03/14/2017. Edwin [name] has also been told that Edwin [name] quits breathing while asleep. His Epworth sleepiness score is 5/24, fatigue score is 42/63. Edwin [name] quit smoking in Edwin 80s. Edwin [name] is retired from working for [employer] for 40 years. Edwin [name] has 4 grandchildren. Edwin [name] lives alone, wife passed away about 2 years ago. Edwin [name] has a dog. Bedtime is typically at or after 12:30 AM and wakeup time around 7:30 AM. Edwin [name] has nocturia nightly,  about 2 or 3 times per average night, denies morning headaches, denies a family history of obstructive sleep apnea. Edwin [name] has one sister. Edwin [name] drinks caffeine in Edwin form of coffee, one or 2 cups per day, typically no sodas. Edwin [name] drinks alcohol in Edwin form of beer, usually one to 3 per day, sometimes more. Edwin [name] has reduced his alcohol consumption but not quit altogether.   His Past Medical History Is Significant For: Past Medical History:  Diagnosis Date  . 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 Anxiety   . Arthritis   . Atrial fibrillation ([place]) CARDIOLOGIST-- DR Einar Gip  . Barrett esophagus   . Cholelithiasis   . Chronic right-sided CHF (congestive heart failure) ([place])   . Colon polyps    TUBULAR ADENOMA (2) & HYPERPLASTIC POLYP  . Cor pulmonale, chronic ([place])   . Diabetes mellitus    NIDDM  . GERD (gastroesophageal reflux disease)   . GI bleed   . Hypertension    benign essential hypertension  . Long-term (current) use of anticoagulants   . Morbid obesity ([place])   . OSA (obstructive sleep apnea) 08/29/2013  . OSA (obstructive sleep apnea)   . Pulmonary hypertension ([facility]) MODERATE  . Pure hypercholesterolemia   . Shortness of breath    with exertion   . Sleep apnea    no CPAP  . Varicose veins of legs     His Past Surgical History Is Significant For: Past Surgical History:  Procedure Laterality Date  . benign mole removal from nose     . CARDIOVASCULAR STRESS TEST  07-19-2012  DR Einar Gip   PROMINENT DIAPHRAGMATIC ATTENUATION/ NORMAL LVEF/ LOW RISK STUDY  . COLONOSCOPY    . ESOPHAGOGASTRODUODENOSCOPY (EGD) WITH PROPOFOL N/A 07/28/2014   Procedure: ESOPHAGOGASTRODUODENOSCOPY (EGD) WITH PROPOFOL;  Surgeon: Irene Shipper, MD;  Location: [facility];  Service: Endoscopy;  Laterality: N/A;  . KNEE ARTHROSCOPY WITH MEDIAL MENISECTOMY Left 04/23/2013   Procedure: LEFT KNEE ARTHROSCOPY WITH PARTIAL MEDIAL MENISECTOMY;  Surgeon: Magnus Sinning, MD;  Location: [facility];  Service: Orthopedics;  Laterality: Left;  . MOUTH SURGERY    . TRANSTHORACIC ECHOCARDIOGRAM  05-16-2012   LOW NORMAL LVEF/ MOD. RV/ MILD HYPOKINESIS/ MOD. PULMONARY HTN/ CHRONIC COR PUMONALE/ NO SIG. CHANGE FROM 12-01-2010    His Family History Is Significant For: Family History  Problem Relation Age of Onset  . Heart disease Mother   . Hypertension Father   . Cancer Father   . Throat cancer Paternal Uncle   . Colon cancer Neg Hx   . Prostate cancer Neg Hx   . Rectal cancer Neg Hx   .  Stomach cancer Neg Hx     His Social History Is Significant For: Social History   Social History  . Marital status: Married    Spouse name: N/A  . Number of children: N/A  . Years of education: N/A   Occupational History  . retired    Social History Main Topics  . Smoking status: Former Smoker    Packs/day: 1.00    Types: Cigarettes    Quit date: 06/28/1987  . Smokeless tobacco: Never Used  . Alcohol use 0.0 oz/week     Comment: pint daily  . Drug use: No  . Sexual activity: Not Asked   Other Topics Concern  . None   Social History Narrative  . None    His Allergies Are:  Allergies  Allergen Reactions  . Lisinopril Swelling    Tongue swelling  :  His Current Medications Are:  Outpatient Encounter Prescriptions as of 09/06/2017  Medication Sig  . ALPRAZolam (XANAX) 0.5 MG tablet Take 0.5 mg by mouth at bedtime as needed for anxiety.  Marland Kitchen amLODipine (NORVASC) 5 MG tablet Take 5 mg by mouth daily.  Marland Kitchen FLUoxetine (PROZAC) 40 MG capsule Take 40 mg by mouth daily.  . folic acid (FOLVITE) 1 MG tablet Take 1 tablet (1 mg total) by mouth daily.  . furosemide (LASIX) 40 MG tablet Take 40 mg by mouth 2 (two) times daily.  Marland Kitchen loratadine (CLARITIN) 10 MG tablet TAKE 1 TABLET BY MOUTH EVERY DAY  . losartan (COZAAR) 100 MG tablet Take 100 mg by mouth daily.  . meloxicam (MOBIC) 15 MG tablet Take 15 mg by mouth daily.  . metFORMIN (GLUCOPHAGE) 500 MG tablet Take 1 tablet (500 mg total) by mouth 2 (two) times daily with a meal. ([name] taking differently: Take 500 mg by mouth daily with breakfast. )  . pantoprazole (PROTONIX) 40 MG tablet Take 1 tablet (40 mg total) by mouth daily.  . potassium chloride SA (K-DUR,KLOR-CON) 20 MEQ tablet Take 1 tablet (20 mEq total) by mouth daily. ([name] taking differently: Take 20 mEq by mouth 2 (two) times daily. )  . pravastatin (PRAVACHOL) 20 MG tablet Take 1 tablet (20 mg total) by mouth daily after supper.  . sildenafil (VIAGRA) 100 MG  tablet Take 100 mg by mouth daily as needed for erectile dysfunction.  Marland Kitchen warfarin (COUMADIN) 5 MG tablet Take 5 mg by mouth daily.   No facility-administered encounter medications on file as of 09/06/2017.   :  Review of Systems:  Out of a complete 14 point review of systems, all are reviewed and negative with Edwin exception of these symptoms as listed below: Review of Systems  Neurological:       [name] states that Edwin [name] has had a lot of trouble keeping Edwin CPAP mask on at night.     Objective:  Neurological Exam  Physical Exam Physical Examination:   Vitals:   09/06/17 1305  BP: (!) 150/80  Pulse: (!) 54   General Examination: Edwin [name] is a very pleasant 76 y.o. male in no acute distress. Edwin [name] appears well-developed and well-nourished and well groomed.   HEENT: Normocephalic, atraumatic, pupils are equal, round and reactive to light and accommodation. Edwin [name] is status post cataract repairs. Extraocular tracking is good without limitation to gaze excursion or nystagmus noted. Normal smooth pursuit is noted. Hearing is grossly intact. Face is symmetric with normal facial animation and normal facial sensation. Speech is clear with no dysarthria noted. There is no hypophonia. There is no lip, neck/head, jaw or voice tremor. Neck is supple with full range of passive and active motion. There are no carotid bruits on auscultation. Oropharynx exam reveals: mild mouth dryness, adequate dental hygiene with many missing teeth, and moderate airway crowding. Mallampati is class III. Tongue protrudes centrally and palate elevates symmetrically.    Chest: Clear to auscultation without wheezing, rhonchi or crackles noted.  Heart: S1+S2+0, irregular and mild murmur.   Abdomen: Soft, non-tender and non-distended with normal bowel sounds appreciated on auscultation.  Extremities: There is 3+ pitting edema in Edwin distal lower extremities bilaterally, chronic thickening of skin, no compression socks.    Skin: Warm and dry without trophic changes noted. There are no varicose veins but thicker skin in Edwin distal lower extremities, below knees, worse around Edwin ankles and feet. chronic stasis-like changes.   Musculoskeletal: exam reveals no obvious  joint deformities, tenderness or joint swelling or erythema.   Neurologically:  Mental status: Edwin [name] is awake, alert and oriented in all 4 spheres. His immediate and remote memory, attention, language skills and fund of knowledge are appropriate. There is no evidence of aphasia, agnosia, apraxia or anomia. Speech is clear with normal prosody and enunciation. Thought process is linear. Mood is normal and affect is normal.  Cranial nerves II - XII are as described above under HEENT exam. In addition: shoulder shrug is normal with equal shoulder height noted. Motor exam: Normal bulk, strength and tone is noted. There is no drift, tremor or rebound. Reflexes are 1+ in Edwin upper extremities and unable to elicit in Edwin lower extremities due to swelling. Fine motor skills and coordination: grossly intact in Edwin UEs and difficult to assess in Edwin LEs.   Cerebellar testing: No dysmetria or intention tremor on finger to nose testing.  Sensory exam: intact to light touch in Edwin upper and lower extremities.  Gait, station and balance: Edwin [name] stands with difficulty. No veering to one side is noted. No leaning to one side is noted. Posture is age-appropriate and stance is wide-based. Edwin [name] walks slowly. Tandem walk is not possible.   Assessment and Plan:   In summary, Edwin [name] is a very pleasant 76 year old male with an underlying medical history of alcoholism, history of V. tach, history of hyponatremia, A. fib, with recent restart of Coumadin, hypertension, type 2 diabetes, reflux disease, hyperlipidemia, chronic right sided CHF and cor pulmonale, and morbid obesity, who Presents for follow-up consultation of his severe obstructive sleep apnea, not currently  fully compliant with CPAP therapy unfortunately. While Edwin [name] does note an improvement in his sleep quality and daytime energy, Edwin [name] has struggled with compliance. Edwin [name] does admit that Edwin [name] tends to take off Edwin mask at night without realizing it. Edwin [name] is motivated to do a better job with not skipping any nights and trying to keep Edwin mask on at night. Edwin [name] switched from nasal pillows to Edwin nasal cradle interface as I understand. Edwin [name] is advised to talk to his DME provider for further help with mask fit issues. Leak is acceptable at this time. AHI is not quite optimal but I would like to see better compliance before making changes to his pressure setting. I suggested a 6 week recheck with one of ourpractitioners. We talked in detail about his to sleep studies and compliance data today. I answered all his questions today and Edwin [name] was in agreement.  I spent 25 minutes in total face-to-face time with Edwin [name], more than 50% of which was spent in counseling and coordination of care, reviewing test results, reviewing medication and discussing or reviewing Edwin diagnosis of OSA, its prognosis and treatment options. Pertinent laboratory and imaging test results that were available during this visit with Edwin [name] were reviewed by me and considered in my medical decision making (see chart for details).

## 2017-11-19 ENCOUNTER — Encounter: Payer: Self-pay | Admitting: Adult Health

## 2017-11-21 ENCOUNTER — Encounter: Payer: Self-pay | Admitting: Adult Health

## 2017-11-21 ENCOUNTER — Ambulatory Visit (INDEPENDENT_AMBULATORY_CARE_PROVIDER_SITE_OTHER): Payer: MEDICARE | Admitting: Adult Health

## 2017-11-21 VITALS — BP 142/77 | HR 48 | Wt 350.8 lb

## 2017-11-21 DIAGNOSIS — Z9989 Dependence on other enabling machines and devices: Secondary | ICD-10-CM

## 2017-11-21 DIAGNOSIS — G4733 Obstructive sleep apnea (adult) (pediatric): Secondary | ICD-10-CM

## 2017-11-21 NOTE — Progress Notes (Addendum)
PATIENT: Edwin [name] DOB: [birth year]  REASON FOR VISIT: follow up HISTORY FROM: patient  HISTORY OF PRESENT ILLNESS: Today 11/21/17 Edwin [name] is a 77 year old male with a history of obstructive sleep apnea on CPAP.  He returns today for follow-up.  His CPAP download indicates that he has not used his machine in the last month and a half.  He reports that he stopped using the machine because the mask Coming off during the night and blowing air in his face.  He states that he is only tried the nasal pillows and dream wear mask.  The patient reports that he is willing to try a different style mask to see if he can use the machine.  He returns today for evaluation.  HISTORY Edwin [name] is a 77 year old right-handed gentleman with an underlying medical history of alcoholism, history of V. tach, history of hyponatremia, A. fib, with recent restart of Coumadin, hypertension, type 2 diabetes, reflux disease, hyperlipidemia, chronic right sided CHF and cor pulmonale, and morbid obesity, who presents for follow-up consultation of his obstructive sleep apnea, after her recent sleep study testing. The patient is unaccompanied today. I first met him on 05/01/2017 at the request of his primary care physician, at which time he reported snoring and excessive daytime somnolence. I asked him to proceed with sleep study testing. He had a baseline sleep study, followed by a CPAP titration study. I went over his test results with him in detail today. Baseline sleep study from 05/27/2017 showed a sleep efficiency of 93.5%, sleep latency of 21.5 minutes, REM latency of 142.5 minutes. He had absence of slow-wave sleep and REM sleep was elevated at 37.6%. Total AHI was 40.4 per hour, REM AHI 42.5 per hour, supine AHI 20.9 per hour. Average oxygen saturation was 92%, nadir was 77%. Time below 89% saturation was 222 minutes for the night. He had mild PLMS with an index of 23.5 per hour, without associated arousals. EKG showed  A. fib. Based on his medical history and his test results I suggested he return for a full night CPAP titration study. He had this on 07/03/2017. Sleep efficiency was 68%, sleep latency 11.5 minutes, REM latency 45 minutes. Wake after sleep onset was 45.5 minutes. EKG showed A. fib and PVCs. He was fitted with a small nasal pillows interface and CPAP was titrated from 7 cm to 10 cm. AHI was 0 per hour on the final pressure with supine non-REM sleep achieved an O2 nadir of 90%. Based on his test results I prescribed CPAP therapy for home use.   09/06/2017   I reviewed his CPAP compliance data from 08/02/2017 through 09/04/2017 which is a total of 34 days, during which time he used his CPAP 24 days only with percent used days greater than 4 hours at 0%, indicating poor compliance with an average usage for days on treatment of one hour and 49 minutes only, residual AHI suboptimal at 7.2 per hour, leak acceptable with the 95th percentile at 15.7 L/m on a pressure of 10 cm with EPR of 3. Residual AHI is obstructive in nature. He reports some improvement at times with daytime energy. He is willing to continue and to work on compliance.   The patient's allergies, current medications, family history, past medical history, past social history, past surgical history and problem list were reviewed and updated as appropriate.    REVIEW OF SYSTEMS: Out of a complete 14 system review of symptoms, the patient complains only of the following  symptoms, and all other reviewed systems are negative.  Epworth sleepiness score 12  ALLERGIES: Allergies  Allergen Reactions  . Lisinopril Swelling    Tongue swelling    HOME MEDICATIONS: Outpatient Medications Prior to Visit  Medication Sig Dispense Refill  . ALPRAZolam (XANAX) 0.5 MG tablet Take 0.5 mg by mouth at bedtime as needed for anxiety.    Marland Kitchen amLODipine (NORVASC) 5 MG tablet Take 5 mg by mouth daily.    Marland Kitchen FLUoxetine (PROZAC) 40 MG capsule Take 40 mg by  mouth daily.    . folic acid (FOLVITE) 1 MG tablet Take 1 tablet (1 mg total) by mouth daily.    . furosemide (LASIX) 40 MG tablet Take 40 mg by mouth 2 (two) times daily.    Marland Kitchen loratadine (CLARITIN) 10 MG tablet TAKE 1 TABLET BY MOUTH EVERY DAY 30 tablet 0  . losartan (COZAAR) 100 MG tablet Take 100 mg by mouth daily.    . meloxicam (MOBIC) 15 MG tablet Take 15 mg by mouth daily.    . metFORMIN (GLUCOPHAGE) 500 MG tablet Take 1 tablet (500 mg total) by mouth 2 (two) times daily with a meal. (Patient taking differently: Take 500 mg by mouth daily with breakfast. )    . pantoprazole (PROTONIX) 40 MG tablet Take 1 tablet (40 mg total) by mouth daily. 60 tablet 2  . potassium chloride SA (K-DUR,KLOR-CON) 20 MEQ tablet Take 1 tablet (20 mEq total) by mouth daily. (Patient taking differently: Take 20 mEq by mouth 2 (two) times daily. ) 60 tablet 3  . pravastatin (PRAVACHOL) 20 MG tablet Take 1 tablet (20 mg total) by mouth daily after supper.    . sildenafil (VIAGRA) 100 MG tablet Take 100 mg by mouth daily as needed for erectile dysfunction.    Marland Kitchen warfarin (COUMADIN) 5 MG tablet Take 5 mg by mouth daily.     No facility-administered medications prior to visit.     PAST MEDICAL HISTORY: Past Medical History:  Diagnosis Date  . Adynamic ileus ([place])   . Anxiety   . Arthritis   . Atrial fibrillation ([place]) CARDIOLOGIST-- DR Einar Gip  . Barrett esophagus   . Cholelithiasis   . Chronic right-sided CHF (congestive heart failure) ([place])   . Colon polyps    TUBULAR ADENOMA (2) & HYPERPLASTIC POLYP  . Cor pulmonale, chronic ([place])   . Diabetes mellitus    NIDDM  . GERD (gastroesophageal reflux disease)   . GI bleed   . Hypertension    benign essential hypertension  . Long-term (current) use of anticoagulants   . Morbid obesity ([place])   . OSA (obstructive sleep apnea) 08/29/2013  . OSA (obstructive sleep apnea)   . Pulmonary hypertension ([facility]) MODERATE  . Pure hypercholesterolemia   . Shortness of  breath    with exertion   . Sleep apnea    no CPAP  . Varicose veins of legs     PAST SURGICAL HISTORY: Past Surgical History:  Procedure Laterality Date  . benign mole removal from nose     . CARDIOVASCULAR STRESS TEST  07-19-2012  DR Einar Gip   PROMINENT DIAPHRAGMATIC ATTENUATION/ NORMAL LVEF/ LOW RISK STUDY  . COLONOSCOPY    . ESOPHAGOGASTRODUODENOSCOPY (EGD) WITH PROPOFOL N/A 07/28/2014   Procedure: ESOPHAGOGASTRODUODENOSCOPY (EGD) WITH PROPOFOL;  Surgeon: Irene Shipper, MD;  Location: [facility];  Service: Endoscopy;  Laterality: N/A;  . KNEE ARTHROSCOPY WITH MEDIAL MENISECTOMY Left 04/23/2013   Procedure: LEFT KNEE ARTHROSCOPY WITH PARTIAL MEDIAL MENISECTOMY;  Surgeon:  Magnus Sinning, MD;  Location: [facility];  Service: Orthopedics;  Laterality: Left;  . MOUTH SURGERY    . TRANSTHORACIC ECHOCARDIOGRAM  05-16-2012   LOW NORMAL LVEF/ MOD. RV/ MILD HYPOKINESIS/ MOD. PULMONARY HTN/ CHRONIC COR PUMONALE/ NO SIG. CHANGE FROM 12-01-2010    FAMILY HISTORY: Family History  Problem Relation Age of Onset  . Heart disease Mother   . Hypertension Father   . Cancer Father   . Throat cancer Paternal Uncle   . Colon cancer Neg Hx   . Prostate cancer Neg Hx   . Rectal cancer Neg Hx   . Stomach cancer Neg Hx     SOCIAL HISTORY: Social History   Socioeconomic History  . Marital status: Married    Spouse name: Not on file  . Number of children: Not on file  . Years of education: Not on file  . Highest education level: Not on file  Social Needs  . Financial resource strain: Not on file  . Food insecurity - worry: Not on file  . Food insecurity - inability: Not on file  . Transportation needs - medical: Not on file  . Transportation needs - non-medical: Not on file  Occupational History  . Occupation: retired  Tobacco Use  . Smoking status: Former Smoker    Packs/day: 1.00    Types: Cigarettes    Last attempt to quit: 06/28/1987    Years since quitting: 30.4  . Smokeless tobacco:  Never Used  Substance and Sexual Activity  . Alcohol use: Yes    Alcohol/week: 0.0 oz    Comment: pint daily  . Drug use: No  . Sexual activity: Not on file  Other Topics Concern  . Not on file  Social History Narrative  . Not on file      PHYSICAL EXAM  Vitals:   11/21/17 1106  BP: (!) 142/77  Pulse: (!) 48  Weight: (!) 350 lb 12.8 oz (159.1 kg)   Body mass index is 48.93 kg/m.  Generalized: Well developed, in no acute distress   Neurological examination  Mentation: Alert oriented to time, place, history taking. Follows all commands speech and language fluent Cranial nerve II-XII: Pupils were equal round reactive to light. Extraocular movements were full, visual field were full on confrontational test. Facial sensation and strength were normal. Uvula tongue midline. Head turning and shoulder shrug  were normal and symmetric. Motor: The motor testing reveals 5 over 5 strength of all 4 extremities. Good symmetric motor tone is noted throughout.  Sensory: Sensory testing is intact to soft touch on all 4 extremities. No evidence of extinction is noted.  Coordination: Cerebellar testing reveals good finger-nose-finger and heel-to-shin bilaterally.  Gait and station: Gait is normal. Tandem gait is normal. Romberg is negative. No drift is seen.  Reflexes: Deep tendon reflexes are symmetric and normal bilaterally.   DIAGNOSTIC DATA (LABS, IMAGING, TESTING) - I reviewed patient records, labs, notes, testing and imaging myself where available.  Lab Results  Component Value Date   WBC 15.6 (H) 09/15/2015   HGB 8.6 (L) 09/15/2015   HCT 25.5 (L) 09/15/2015   MCV 95.9 09/15/2015   PLT 162 09/15/2015      Component Value Date/Time   NA 137 09/16/2015 0319   K 4.5 09/16/2015 0319   CL 106 09/16/2015 0319   CO2 26 09/16/2015 0319   GLUCOSE 109 (H) 09/16/2015 0319   BUN 11 09/16/2015 0319   CREATININE 0.97 09/16/2015 0319   CALCIUM 8.5 (L) 09/16/2015  0319   PROT 5.2 (L)  09/15/2015 0312   ALBUMIN 2.7 (L) 09/15/2015 0312   AST 53 (H) 09/15/2015 0312   ALT 61 09/15/2015 0312   ALKPHOS 56 09/15/2015 0312   BILITOT 1.8 (H) 09/15/2015 0312   GFRNONAA >60 09/16/2015 0319   GFRAA >60 09/16/2015 0319       ASSESSMENT AND PLAN 77 y.o. year old male  has a past medical history of Adynamic ileus ([place]), Anxiety, Arthritis, Atrial fibrillation ([place]) (CARDIOLOGIST-- DR Einar Gip), Barrett esophagus, Cholelithiasis, Chronic right-sided CHF (congestive heart failure) ([place]), Colon polyps, Cor pulmonale, chronic ([place]), Diabetes mellitus, GERD (gastroesophageal reflux disease), GI bleed, Hypertension, Long-term (current) use of anticoagulants, Morbid obesity ([place]), OSA (obstructive sleep apnea) (08/29/2013), OSA (obstructive sleep apnea), Pulmonary hypertension ([place]) (MODERATE), Pure hypercholesterolemia, Shortness of breath, Sleep apnea, and Varicose veins of legs. here with:  1.  Obstructive sleep apnea on CPAP  Patient reports that he was unable to tolerate the nasal pillows and dream wear mask.  For that reason he has not been using his CPAP machine.  He does state that he is willing to try a different style of mask to see if he can tolerate wearing CPAP at night.  We discussed the risks associated with untreated sleep apnea.  He voiced understanding.  He will return in 3 months or sooner if needed.  I spent 15 minutes with the patient. 50% of this time was spent reviewing his CPAP download     Ward Givens, MSN, NP-C 11/21/2017, 11:21 AM [facility] [address], [place], [place] [address] [phone]  I reviewed the above note and documentation by the Nurse Practitioner and agree with the history, physical exam, assessment and plan as outlined above. I was immediately available for face-to-face consultation. Star Age, MD, PhD [facility])

## 2017-11-21 NOTE — Progress Notes (Signed)
DME order successfully faxed to [facility], re: mask.

## 2017-11-21 NOTE — Patient Instructions (Signed)
Your Plan:  Continue trying to use CPAP Schedule mask refitting with [facility]  Please call [facility] at [phone], and press option 1 when prompted. Their customer service representatives will be glad to assist you. If they are unable to answer, please leave a message and they will call you back. Make sure to leave your name and return phone number.  Thank you for coming to see [place] at [facility]. I hope we have been able to provide you high quality care today.  You may receive a patient satisfaction survey over the next few weeks. We would appreciate your feedback and comments so that we may continue to improve ourselves and the health of our patients.

## 2017-12-03 ENCOUNTER — Telehealth: Payer: Self-pay

## 2017-12-03 NOTE — Telephone Encounter (Signed)
I called pt. We received notice from [facility] that pt has not met compliance requirements for his cpap, and to keep his cpap, he will need a new face to face mentioning the need for a new sleep study, then have a sleep study, and then another order for the cpap machine. I explained this to him. Pt is meeting with [facility] tomorrow to discuss a new mask fit and says that he will call me back tomorrow and let me know if he decides to undergo this process again to keep his cpap.

## 2017-12-10 NOTE — Telephone Encounter (Signed)
I called pt to discuss. No answer, left a message asking him to call me back. 

## 2017-12-10 NOTE — Telephone Encounter (Signed)
Pt returned my call. He reports that he is trying cpap again and would be agreeable to starting the process over to keep his cpap, including a new F2F and sleep study. An appt was made with Dr. Rexene Alberts and pt on 12/13/17 at 3:00pm. Pt verbalized understanding of new appt date and time. I encouraged pt to keep using his cpap at least 4 hours per night. Pt verbalized understanding.

## 2017-12-13 ENCOUNTER — Encounter: Payer: Self-pay | Admitting: Neurology

## 2017-12-13 ENCOUNTER — Ambulatory Visit (INDEPENDENT_AMBULATORY_CARE_PROVIDER_SITE_OTHER): Payer: MEDICARE | Admitting: Neurology

## 2017-12-13 VITALS — BP 146/73 | HR 61 | Ht 71.0 in | Wt 358.5 lb

## 2017-12-13 DIAGNOSIS — Z789 Other specified health status: Secondary | ICD-10-CM | POA: Diagnosis not present

## 2017-12-13 DIAGNOSIS — G4733 Obstructive sleep apnea (adult) (pediatric): Secondary | ICD-10-CM | POA: Diagnosis not present

## 2017-12-13 DIAGNOSIS — Z6841 Body Mass Index (BMI) 40.0 and over, adult: Secondary | ICD-10-CM | POA: Diagnosis not present

## 2017-12-13 NOTE — Patient Instructions (Signed)
We will redo sleep study testing to help you requalify for CPAP therapy for your severe sleep apnea, we should not leave it untreated.   Please continue to work on weight loss.  Please remember, the risks and ramifications of moderate to severe obstructive sleep apnea or OSA are: Cardiovascular disease, including congestive heart failure, stroke, difficult to control hypertension, arrhythmias, and even type 2 diabetes has been linked to untreated OSA. Sleep apnea causes disruption of sleep and sleep deprivation in most cases, which, in turn, can cause recurrent headaches, problems with memory, mood, concentration, focus, and vigilance. Most people with untreated sleep apnea report excessive daytime sleepiness, which can affect their ability to drive. Please do not drive if you feel sleepy.   I will see you back after your sleep study to go over the test results and hopefully, your CPAP compliance.   Our sleep lab administrative assistant will call you to schedule your sleep study. If you don't hear back from her by about 2 weeks from now, please feel free to call her at [phone]. You can leave a message with your phone number and concerns, if you get the voicemail box. She will call back as soon as possible.

## 2017-12-13 NOTE — Progress Notes (Signed)
Subjective:    Patient ID: Edwin [name] is a 77 y.o. male.  HPI     Interim history:   Edwin [name] is a 77 year old right-handed gentleman with an underlying medical history of alcoholism, history of V. tach, history of hyponatremia, A. fib, with recent restart of Coumadin, hypertension, type 2 diabetes, reflux disease, hyperlipidemia, chronic right sided CHF and cor pulmonale, and morbid obesity, who presents for follow-up consultation of his obstructive sleep apnea, to be able to really qualify for sleep apnea testing with another sleep study and restarting CPAP therapy at home. I last saw him on 09/06/2017, at which time he was noncompliant with his CPAP. We talked about his test results at the time. He had a baseline sleep study on 05/27/2017, which showed an AHI of 40.4 per hour, O2 nadir of 77%. He had a CPAP titration study on 07/03/2017 with reasonably good results on a CPAP pressure of 10 cm. He did report some improvement in his daytime energy and was advised to follow-up for CPAP compliance. He saw Ward Givens, nurse practitioner, in the interim on 11/21/2017, at which time he was no longer using his CPAP machine, he reported difficulty with a nasal pillows and nasal mask. He did make an appointment for a mask refit with his DME company, let him hold onto his CPAP machine with the understanding that he would have to have another sleep study to qualify for sleep apnea treatment.  Today, 12/13/2017: He reports that he could not tolerate nasal pillows and also had difficulty with a nasal mask he tried. He had mouth opening and mouth dryness at night. I reviewed his CPAP compliance data from the last 30 days, during which time he tried CPAP for days, with percent used days greater than 4 hours at 0%, indicating noncompliance. He would like to pursue repeat sleep study testing to be able to use CPAP again. He would be willing to be compliant with treatment and would like to try a full facemask  this time around. His Epworth sleepiness score is 10 out of 24, fatigue score is 45 out of 63. He has had no interim changes to his medications or medical history.  The patient's allergies, current medications, family history, past medical history, past social history, past surgical history and problem list were reviewed and updated as appropriate.    Previously (copied from previous notes for reference):   I first met him on 05/01/2017 at the request of his primary care physician, at which time he reported snoring and excessive daytime somnolence. I asked him to proceed with sleep study testing. He had a baseline sleep study, followed by a CPAP titration study. I went over his test results with him in detail today. Baseline sleep study from 05/27/2017 showed a sleep efficiency of 93.5%, sleep latency of 21.5 minutes, REM latency of 142.5 minutes. He had absence of slow-wave sleep and REM sleep was elevated at 37.6%. Total AHI was 40.4 per hour, REM AHI 42.5 per hour, supine AHI 20.9 per hour. Average oxygen saturation was 92%, nadir was 77%. Time below 89% saturation was 222 minutes for the night. He had mild PLMS with an index of 23.5 per hour, without associated arousals. EKG showed A. fib. Based on his medical history and his test results I suggested he return for a full night CPAP titration study. He had this on 07/03/2017. Sleep efficiency was 68%, sleep latency 11.5 minutes, REM latency 45 minutes. Wake after sleep onset was 45.5 minutes. EKG  showed A. fib and PVCs. He was fitted with a small nasal pillows interface and CPAP was titrated from 7 cm to 10 cm. AHI was 0 per hour on the final pressure with supine non-REM sleep achieved an O2 nadir of 90%. Based on his test results I prescribed CPAP therapy for home use.   I reviewed his CPAP compliance data from 08/02/2017 through 09/04/2017 which is a total of 34 days, during which time he used his CPAP 24 days only with percent used days greater than 4  hours at 0%, indicating poor compliance with an average usage for days on treatment of one hour and 49 minutes only, residual AHI suboptimal at 7.2 per hour, leak acceptable with the 95th percentile at 15.7 L/m on a pressure of 10 cm with EPR of 3. Residual AHI is obstructive in nature.    05/01/2017: (He) reports snoring and excessive daytime somnolence. He has never had a sleep study. I reviewed your office note from 03/14/2017. He has also been told that he quits breathing while asleep. His Epworth sleepiness score is 5/24, fatigue score is 42/63. He quit smoking in the 80s. He is retired from working for the railroad for 40 years. He has 4 grandchildren. He lives alone, wife passed away about 2 years ago. He has a dog. Bedtime is typically at or after 12:30 AM and wakeup time around 7:30 AM. He has nocturia nightly, about 2 or 3 times per average night, denies morning headaches, denies a family history of obstructive sleep apnea. He has one sister. He drinks caffeine in the form of coffee, one or 2 cups per day, typically no sodas. He drinks alcohol in the form of beer, usually one to 3 per day, sometimes more. He has reduced his alcohol consumption but not quit altogether.   His Past Medical History Is Significant For: Past Medical History:  Diagnosis Date  . Adynamic ileus ([place])   . Anxiety   . Arthritis   . Atrial fibrillation ([place]) CARDIOLOGIST-- DR Einar Gip  . Barrett esophagus   . Cholelithiasis   . Chronic right-sided CHF (congestive heart failure) ([place])   . Colon polyps    TUBULAR ADENOMA (2) & HYPERPLASTIC POLYP  . Cor pulmonale, chronic ([place])   . Diabetes mellitus    NIDDM  . GERD (gastroesophageal reflux disease)   . GI bleed   . Hypertension    benign essential hypertension  . Long-term (current) use of anticoagulants   . Morbid obesity ([place])   . OSA (obstructive sleep apnea) 08/29/2013  . OSA (obstructive sleep apnea)   . Pulmonary hypertension ([facility]) MODERATE  . Pure  hypercholesterolemia   . Shortness of breath    with exertion   . Sleep apnea    no CPAP  . Varicose veins of legs     His Past Surgical History Is Significant For: Past Surgical History:  Procedure Laterality Date  . benign mole removal from nose     . CARDIOVASCULAR STRESS TEST  07-19-2012  DR Einar Gip   PROMINENT DIAPHRAGMATIC ATTENUATION/ NORMAL LVEF/ LOW RISK STUDY  . COLONOSCOPY    . ESOPHAGOGASTRODUODENOSCOPY (EGD) WITH PROPOFOL N/A 07/28/2014   Procedure: ESOPHAGOGASTRODUODENOSCOPY (EGD) WITH PROPOFOL;  Surgeon: Irene Shipper, MD;  Location: [facility];  Service: Endoscopy;  Laterality: N/A;  . KNEE ARTHROSCOPY WITH MEDIAL MENISECTOMY Left 04/23/2013   Procedure: LEFT KNEE ARTHROSCOPY WITH PARTIAL MEDIAL MENISECTOMY;  Surgeon: Magnus Sinning, MD;  Location: [facility];  Service: Orthopedics;  Laterality: Left;  .  MOUTH SURGERY    . TRANSTHORACIC ECHOCARDIOGRAM  05-16-2012   LOW NORMAL LVEF/ MOD. RV/ MILD HYPOKINESIS/ MOD. PULMONARY HTN/ CHRONIC COR PUMONALE/ NO SIG. CHANGE FROM 12-01-2010    His Family History Is Significant For: Family History  Problem Relation Age of Onset  . Heart disease Mother   . Hypertension Father   . Cancer Father   . Throat cancer Paternal Uncle   . Colon cancer Neg Hx   . Prostate cancer Neg Hx   . Rectal cancer Neg Hx   . Stomach cancer Neg Hx     His Social History Is Significant For: Social History   Socioeconomic History  . Marital status: Married    Spouse name: None  . Number of children: None  . Years of education: None  . Highest education level: None  Social Needs  . Financial resource strain: None  . Food insecurity - worry: None  . Food insecurity - inability: None  . Transportation needs - medical: None  . Transportation needs - non-medical: None  Occupational History  . Occupation: retired  Tobacco Use  . Smoking status: Former Smoker    Packs/day: 1.00    Types: Cigarettes    Last attempt to quit: 06/28/1987    Years  since quitting: 30.4  . Smokeless tobacco: Never Used  Substance and Sexual Activity  . Alcohol use: Yes    Alcohol/week: 0.0 oz    Comment: pint daily  . Drug use: No  . Sexual activity: None  Other Topics Concern  . None  Social History Narrative  . None    His Allergies Are:  Allergies  Allergen Reactions  . Lisinopril Swelling    Tongue swelling  :   His Current Medications Are:  Outpatient Encounter Medications as of 12/13/2017  Medication Sig  . ALPRAZolam (XANAX) 0.5 MG tablet Take 0.5 mg by mouth at bedtime as needed for anxiety.  Marland Kitchen amLODipine (NORVASC) 5 MG tablet Take 5 mg by mouth daily.  Marland Kitchen FLUoxetine (PROZAC) 40 MG capsule Take 40 mg by mouth daily.  . folic acid (FOLVITE) 1 MG tablet Take 1 tablet (1 mg total) by mouth daily.  . furosemide (LASIX) 40 MG tablet Take 40 mg by mouth 2 (two) times daily.  Marland Kitchen loratadine (CLARITIN) 10 MG tablet TAKE 1 TABLET BY MOUTH EVERY DAY  . losartan (COZAAR) 100 MG tablet Take 100 mg by mouth daily.  . meloxicam (MOBIC) 15 MG tablet Take 15 mg by mouth daily.  . metFORMIN (GLUCOPHAGE) 500 MG tablet Take 1 tablet (500 mg total) by mouth 2 (two) times daily with a meal. (Patient taking differently: Take 500 mg by mouth daily with breakfast. )  . pantoprazole (PROTONIX) 40 MG tablet Take 1 tablet (40 mg total) by mouth daily.  . potassium chloride SA (K-DUR,KLOR-CON) 20 MEQ tablet Take 1 tablet (20 mEq total) by mouth daily. (Patient taking differently: Take 20 mEq by mouth 2 (two) times daily. )  . pravastatin (PRAVACHOL) 20 MG tablet Take 1 tablet (20 mg total) by mouth daily after supper.  . sildenafil (VIAGRA) 100 MG tablet Take 100 mg by mouth daily as needed for erectile dysfunction.  Marland Kitchen warfarin (COUMADIN) 5 MG tablet Take 5 mg by mouth daily.   No facility-administered encounter medications on file as of 12/13/2017.   :  Review of Systems:  Out of a complete 14 point review of systems, all are reviewed and negative with the  exception of these symptoms as listed  below:  Review of Systems  Neurological:       Patient reports that he needs a new CPAP mask, one that covers his nose and mouth.    Objective:  Neurological Exam  Physical Exam Physical Examination:   Vitals:   12/13/17 1506  BP: (!) 146/73  Pulse: 61    General Examination: The patient is a very pleasant 77 y.o. male in no acute distress. He appears well-developed and well-nourished and well groomed.   HEENT: Normocephalic, atraumatic, pupils are equal, round and reactive to light and accommodation. He is status post cataract repairs. Extraocular tracking is good without limitation to gaze excursion or nystagmus noted. Normal smooth pursuit is noted. Hearing is grossly intact. Face is symmetric with normal facial animation and normal facial sensation. Speech is clear with no dysarthria noted. There is no hypophonia. There is no lip, neck/head, jaw or voice tremor. Neck is supple with full range of passive and active motion. There are no carotid bruits on auscultation. Oropharynx exam reveals: mild mouth dryness, adequate dental hygiene with many missing teeth, and moderate airway crowding, due to larger tongue, redundant soft palate and larger uvula, and tonsils. Mallampati is class III. Tongue protrudes centrally and palate elevates symmetrically. Tonsils are 1+ in size. Neck size is 21 inches.   Chest: Clear to auscultation without wheezing, rhonchi or crackles noted.  Heart: S1+S2+0, regular and normal without murmurs, rubs or gallops noted.   Abdomen: Soft, non-tender and non-distended with normal bowel sounds appreciated on auscultation.  Extremities: There is 3+ pitting edema in the distal lower extremities bilaterally.   Skin: Warm and dry without trophic changes noted. There are no varicose veins but thicker skin in the distal lower extremities, below knees, worse around the ankles and feet. chronic stasis-like changes. Not wearing  socks, which I advised him not to do.   Musculoskeletal: exam reveals no obvious joint deformities, tenderness or joint swelling or erythema.   Neurologically:  Mental status: The patient is awake, alert and oriented in all 4 spheres. His immediate and remote memory, attention, language skills and fund of knowledge are appropriate. There is no evidence of aphasia, agnosia, apraxia or anomia. Speech is clear with normal prosody and enunciation. Thought process is linear. Mood is normal and affect is normal.  Cranial nerves II - XII are as described above under HEENT exam. In addition: shoulder shrug is normal with equal shoulder height noted. Motor exam: Normal bulk, strength and tone is noted. There is no drift, tremor or rebound. Romberg is negative. Reflexes are 1+ in the upper extremities and unable to elicit in the lower extremities due to swelling. Fine motor skills and coordination: intact with normal finger taps, normal hand movements, normal rapid alternating patting, normal foot taps and normal foot agility.  Cerebellar testing: No dysmetria or intention tremor. There is no truncal or gait ataxia.  Sensory exam: intact to light touch in the upper and lower extremities.  Gait, station and balance: He stands with difficulty. No veering to one side is noted. No leaning to one side is noted. Posture is age-appropriate and stance is wide-based. He walks slowly. Tandem walk is not possible.  Assessment and Plan:   In summary, Edwin [name] is a very pleasant 77 year old male with an underlying medical history of V. tach, hyponatremia, A. fib, hypertension, type 2 diabetes, reflux disease, hyperlipidemia, chronic right sided CHF and cor pulmonale, and morbid obesity, who presents for reevaluation of his obstructive sleep apnea. He has  a history of severe obstructive sleep apnea but was unable to get compliant with CPAP therapy. He was struggling with a nasal pillows and could not tolerate the nasal  mask, was having issues with mouth opening and mouth dryness. He is willing to be retested with a repeat sleep study. We will request a split-night sleep study. He had a baseline sleep study in July 2018, followed by a CPAP titration study in August 2018.  I again had a long chat with the patient about my findings and the diagnosis of OSA, its prognosis and treatment options. I again explained the risks of untreated moderate to severe OSA, especially with respect to developing cardiovascular disease down the Road, including congestive heart failure, difficult to treat hypertension, cardiac arrhythmias, or stroke. Even type 2 diabetes has, in part, been linked to untreated OSA. Symptoms of untreated OSA include daytime sleepiness, memory problems, mood irritability and mood disorder such as depression and anxiety, lack of energy, as well as recurrent headaches, especially morning headaches. We talked about trying to maintain a healthy lifestyle in general, as well as the importance of weight control. I particularly explained the importance of being compliant with PAP treatment, not only for insurance purposes but primarily to improve His symptoms, and for the patient's long term health benefit, including to reduce His cardiovascular risks. I will see him back for follow-up after sleep testing is completed. I answered all his questions today and he was in agreement. I spent 25 minutes in total face-to-face time with the patient, more than 50% of which was spent in counseling and coordination of care, reviewing test results, reviewing medication and discussing or reviewing the diagnosis of OSA, its prognosis and treatment options. Pertinent laboratory and imaging test results that were available during this visit with the patient were reviewed by me and considered in my medical decision making (see chart for details).

## 2018-01-15 ENCOUNTER — Ambulatory Visit (INDEPENDENT_AMBULATORY_CARE_PROVIDER_SITE_OTHER): Payer: MEDICARE | Admitting: Neurology

## 2018-01-15 DIAGNOSIS — G4733 Obstructive sleep apnea (adult) (pediatric): Secondary | ICD-10-CM

## 2018-01-15 DIAGNOSIS — R9431 Abnormal electrocardiogram [ECG] [EKG]: Secondary | ICD-10-CM

## 2018-01-15 DIAGNOSIS — G4720 Circadian rhythm sleep disorder, unspecified type: Secondary | ICD-10-CM

## 2018-01-15 DIAGNOSIS — Z789 Other specified health status: Secondary | ICD-10-CM

## 2018-01-15 DIAGNOSIS — Z6841 Body Mass Index (BMI) 40.0 and over, adult: Secondary | ICD-10-CM

## 2018-01-15 DIAGNOSIS — G4761 Periodic limb movement disorder: Secondary | ICD-10-CM

## 2018-01-21 ENCOUNTER — Other Ambulatory Visit: Payer: Self-pay | Admitting: Neurology

## 2018-01-21 ENCOUNTER — Telehealth: Payer: Self-pay

## 2018-01-21 DIAGNOSIS — G4733 Obstructive sleep apnea (adult) (pediatric): Secondary | ICD-10-CM

## 2018-01-21 NOTE — Telephone Encounter (Signed)
I called pt to discuss. No answer, left a message asking him to call me back. 

## 2018-01-21 NOTE — Telephone Encounter (Signed)
Pt returned RN's call °

## 2018-01-21 NOTE — Telephone Encounter (Signed)
I called pt back to discuss. No answer, left a message asking him to call me back.

## 2018-01-21 NOTE — Progress Notes (Signed)
Patient referred by PCP, last seen for re-eval of OSA and to restart process for CPAP on 12/13/17, split night sleep study on 01/15/18. Please call and notify patient that the recent sleep study confirmed the diagnosis of severe OSA. He did fairly well with CPAP during the study with improvement of the respiratory events. Therefore, I would like re-start the patient on CPAP therapy at home by prescribing a machine for home use. I placed the order in the chart.  Please advise patient that we need a follow up appointment with either myself or one of our nurse practitioners in about 10 weeks post set-up to check for how the patient is feeling and how well the patient is using the machine, etc. Please go ahead and schedule the appointment, while you have the patient on the phone and make sure patient understands the importance of keeping this window for the FU appointment, as it is often an insurance requirement. Failing to adhere to this may result in losing coverage for sleep apnea treatment, at which point most patients are left with a choice of returning the machine or paying out of pocket (and we want neither of this to happen!).  Please re-enforce the importance of compliance with treatment and the need for [place] to monitor compliance data - again an insurance requirement and usually a good feedback for the patient as far as how they are doing.  Also remind patient, that any PAP machine or mask issues should be first addressed with the DME company, who provided the machine/mask.  He should have prior DME company. He has the machine, as far as I recall. Please arrange for CPAP set up at home through a DME company of patient's choice.  Once you have spoken to the patient you can close the phone encounter. Please fax/route report to referring provider, thanks,   Star Age, MD, PhD [facility])

## 2018-01-21 NOTE — Telephone Encounter (Signed)
-----   Message from Star Age, MD sent at 01/21/2018  9:14 AM EST ----- Patient referred by PCP, last seen for re-eval of OSA and to restart process for CPAP on 12/13/17, split night sleep study on 01/15/18. Please call and notify patient that the recent sleep study confirmed the diagnosis of severe OSA. He did fairly well with CPAP during the study with improvement of the respiratory events. Therefore, I would like re-start the patient on CPAP therapy at home by prescribing a machine for home use. I placed the order in the chart.  Please advise patient that we need a follow up appointment with either myself or one of our nurse practitioners in about 10 weeks post set-up to check for how the patient is feeling and how well the patient is using the machine, etc. Please go ahead and schedule the appointment, while you have the patient on the phone and make sure patient understands the importance of keeping this window for the FU appointment, as it is often an insurance requirement. Failing to adhere to this may result in losing coverage for sleep apnea treatment, at which point most patients are left with a choice of returning the machine or paying out of pocket (and we want neither of this to happen!).  Please re-enforce the importance of compliance with treatment and the need for [place] to monitor compliance data - again an insurance requirement and usually a good feedback for the patient as far as how they are doing.  Also remind patient, that any PAP machine or mask issues should be first addressed with the DME company, who provided the machine/mask.  He should have prior DME company. He has the machine, as far as I recall. Please arrange for CPAP set up at home through a DME company of patient's choice.  Once you have spoken to the patient you can close the phone encounter. Please fax/route report to referring provider, thanks,   Star Age, MD, PhD [facility])

## 2018-01-21 NOTE — Procedures (Signed)
PATIENT'S NAME:  Edwin [name], Edwin [name] DOB:      04-30-1941      MR#:    [id number]     DATE OF RECORDING: 01/15/2018 REFERRING M.D.:  Bernerd Limbo, MD Study Performed:  Split-Night Titration Study HISTORY: 77 year old man with a history of OSA, history of V. tach, history of hyponatremia, A. fib, with recent restart of Coumadin, hypertension, type 2 diabetes, reflux disease, hyperlipidemia, chronic right sided CHF and cor pulmonale, morbid obesity, who presents for re-evaluation of his OSA. He was not compliant with CPAP therapy, reported difficulty with a nasal pillows and nasal mask. His Epworth sleepiness score is 10 out of 24, BMI of 50 kg/m2. The patient's neck circumference measured 21 inches.  CURRENT MEDICATIONS: Xanax, Norvasc, Prozac, Folvite, Lasix, Claritin, Coozar, Mobic, Glucophage, Protonix, K-Dur, Pravachol, Viagra, Coumadin  PROCEDURE:  This is a multichannel digital polysomnogram utilizing the Somnostar 11.2 system.  Electrodes and sensors were applied and monitored per AASM Specifications.   EEG, EOG, Chin and Limb EMG, were sampled at 200 Hz.  ECG, Snore and Nasal Pressure, Thermal Airflow, Respiratory Effort, CPAP Flow and Pressure, Oximetry was sampled at 50 Hz. Digital video and audio were recorded.      BASELINE STUDY WITHOUT CPAP RESULTS:  Lights Out was at 22:33 and Lights On at 05:04 for the night, split start at 01:18, epoch 337. Total recording time (TRT) was 164.5, with a total sleep time (TST) of 140 minutes.   The patient's sleep latency was 13 minutes.  REM sleep was absent. The sleep efficiency was 85.1 %.    SLEEP ARCHITECTURE: WASO (Wake after sleep onset) was 6 minutes, Stage N1 was 5 minutes, Stage N2 was 135 minutes, Stage N3 and R (REM sleep) were absent. The percentages were Stage N1 3.6%, Stage N2 96.4%, which is markedly increased, The arousals were noted as: 1 were spontaneous, 0 were associated with PLMs, 158 were associated with respiratory events.  Audio and  video analysis did not show any abnormal or unusual movements, behaviors, phonations or vocalizations. The patient took no bathroom breaks. Moderate snoring was noted. The EKG was in keeping with atrial fibrillation.   RESPIRATORY ANALYSIS:  There were a total of 156 respiratory events:  135 obstructive apneas, 0 central apneas and 2 mixed apneas with a total of 137 apneas and an apnea index (AI) of 58.7. There were 19 hypopneas with a hypopnea index of 8.1. The patient also had 0 respiratory event related arousals (RERAs).  Snoring was noted.     The total APNEA/HYPOPNEA INDEX (AHI) was 66.9 /hour and the total RESPIRATORY DISTURBANCE INDEX was 66.9 /hour.  0 events occurred in REM sleep and 40 events in NREM. The REM AHI was n/a versus a non-REM AHI of 66.9 /hour. The patient spent 0 minutes sleep time in the supine position 304 minutes in non-supine. The supine AHI was n/a versus a non-supine AHI of 66.8 /hour.  OXYGEN SATURATION & C02:  The wake baseline 02 saturation was 94%, with the lowest being 77%. Time spent below 89% saturation equaled 109 minutes.  PERIODIC LIMB MOVEMENTS: The patient had a total of 0 Periodic Limb Movements.  The Periodic Limb Movement (PLM) index was 0 /hour and the PLM Arousal index was 0 /hour.  TITRATION STUDY WITH CPAP RESULTS:   The patient was fitted with an F20 FFM, size large, CPAP was initiated at 7 cmH20 with heated humidity per AASM split night standards and pressure was advanced to 12 cmH20 because of  hypopneas, apneas and desaturations.  At a PAP pressure of 12 cmH20, there was a reduction of the AHI to 18/hour with non-supine NREM sleep achieved, O2 nadir of 89%.   Total recording time (TRT) was 227 minutes, with a total sleep time (TST) of 163.5 minutes. The patient's sleep latency was 47 minutes. REM latency was 53 minutes.  The sleep efficiency was 72. %.    SLEEP ARCHITECTURE: Wake after sleep was 13 minutes, Stage N1 4.5 minutes, Stage N2 63.5  minutes, Stage N3 22.5 minutes and Stage R (REM sleep) 73 minutes. The percentages were: Stage N1 2.8%, Stage N2 38.8%, Stage N3 13.8% and Stage R (REM sleep) 44.6%, which is increased, and in keeping with rebound. The arousals were noted as: 10 were spontaneous, 12 were associated with PLMs, 41 were associated with respiratory events.  RESPIRATORY ANALYSIS:  There were a total of 42 respiratory events: 25 obstructive apneas, 6 central apneas and 0 mixed apneas with a total of 31 apneas and an apnea index (AI) of 11.4. There were 11 hypopneas with a hypopnea index of 4. /hour. The patient also had 0 respiratory event related arousals (RERAs).      The total APNEA/HYPOPNEA INDEX  (AHI) was 15.4 /hour and the total RESPIRATORY DISTURBANCE INDEX was 15.4 /hour.  11 events occurred in REM sleep and 31 events in NREM. The REM AHI was 9. /hour versus a non-REM AHI of 20.6 /hour. REM sleep was achieved on a pressure of  cm/h2o (AHI was  .) The patient spent 0% of total sleep time in the supine position. The supine AHI was 0.0 /hour, versus a non-supine AHI of 15.4/hour.  OXYGEN SATURATION & C02:  The wake baseline 02 saturation was 93%, with the lowest being 86%. Time spent below 89% saturation equaled 6 minutes.  PERIODIC LIMB MOVEMENTS: The patient had a total of 165 Periodic Limb Movements. The Periodic Limb Movement (PLM) index was 60.6 /hour and the PLM Arousal index was 4.4 /hour.  Post-study, the patient indicated that sleep was better than usual.  POLYSOMNOGRAPHY IMPRESSION :   1. Obstructive Sleep Apnea (OSA)  2. Dysfunctions associated with sleep stages or arousals from sleep 3. Periodic Limb Movement Disorder (PLMD) 4. Abnormal EKG  RECOMMENDATIONS:  1. This patient has severe obstructive sleep apnea and responded on CPAP therapy. The lack of REM and supine sleep during the diagnostic part of the study likely underestimates the AHI and O2 nadir. Due to residual OSA on a treatment pressure  of 12 cm, I will recommend a home CPAP pressure of 14 cm via large FFM, with heated humidity. The patient should be reminded to be fully compliant with PAP therapy to improve sleep related symptoms and decrease long term cardiovascular risks. Please note that untreated obstructive sleep apnea carries additional perioperative morbidity. Patients with significant obstructive sleep apnea should receive perioperative PAP therapy and the surgeons and particularly the anesthesiologist should be informed of the diagnosis and the severity of the sleep disordered breathing. 2. The study showed an abnormal EKG in keeping with atrial fibrillation.  3. Severe PLMs (periodic limb movements of sleep) were noted during the treatment portion of the study with no significant arousals; clinical correlation is recommended. Medication effect from the antidepressant medication should be considered.  4. This study shows sleep fragmentation and abnormal sleep stage percentages; these are nonspecific findings and per se do not signify an intrinsic sleep disorder or a cause for the patient's sleep-related symptoms. Causes include (but are  not limited to) the first night effect of the sleep study, circadian rhythm disturbances, medication effect or an underlying mood disorder or medical problem.  5. The patient should be cautioned not to drive, work at heights, or operate dangerous or heavy equipment when tired or sleepy. Review and reiteration of good sleep hygiene measures should be pursued with any patient. 6. The patient will be seen in follow-up by Dr. Rexene Alberts at [facility] for discussion of the test results and further management strategies. The referring provider will be notified of the test results.  I certify that I have reviewed the entire raw data recording prior to the issuance of this report in accordance with the Standards of Accreditation of the American Academy of Sleep Medicine (AASM)   Star Age, MD, PhD Diplomat,  American Board of Psychiatry and Neurology (Neurology and Sleep Medicine)

## 2018-01-21 NOTE — Telephone Encounter (Signed)
Pt has returned the call to RN Kristen, he is asking for a call back °

## 2018-01-21 NOTE — Telephone Encounter (Signed)
I called pt. I advised pt that Dr. Rexene Alberts reviewed their sleep study results and found that pt has severe osa but did fairly well with the cpap during the latest sleep study with the cpap. Dr. Rexene Alberts recommends that pt start a cpap at home. I reviewed PAP compliance expectations with the pt. Pt is agreeable to starting a CPAP. I advised pt that an order will be sent to a DME, [facility], and [facility] will call the pt within about one week after they file with the pt's insurance. [facility] will show the pt how to use the machine, fit for masks, and troubleshoot the CPAP if needed. A follow up appt was made for insurance purposes with Dr. Rexene Alberts on 04/23/18 at 2:00pm. Pt verbalized understanding to arrive 15 minutes early and bring their CPAP. A letter with all of this information in it will be sent to the pt's mychart account as a reminder. I verified with the pt that the address we have on file is correct. Pt verbalized understanding of results. Pt had no questions at this time but was encouraged to call back if questions arise.

## 2018-03-07 ENCOUNTER — Ambulatory Visit: Payer: MEDICARE | Admitting: Adult Health

## 2018-04-18 ENCOUNTER — Encounter: Payer: Self-pay | Admitting: Adult Health

## 2018-04-18 ENCOUNTER — Ambulatory Visit (INDEPENDENT_AMBULATORY_CARE_PROVIDER_SITE_OTHER): Payer: MEDICARE | Admitting: Adult Health

## 2018-04-18 VITALS — BP 140/79 | HR 61 | Ht 71.0 in | Wt 350.2 lb

## 2018-04-18 DIAGNOSIS — Z9989 Dependence on other enabling machines and devices: Secondary | ICD-10-CM

## 2018-04-18 DIAGNOSIS — G4733 Obstructive sleep apnea (adult) (pediatric): Secondary | ICD-10-CM

## 2018-04-18 NOTE — Progress Notes (Addendum)
PATIENT: Edwin [name] DOB: [birth year]  REASON FOR VISIT: follow up HISTORY FROM: patient  HISTORY OF PRESENT ILLNESS: Today 04/18/18 Edwin [name] is a 77 year old male with a history of obstructive sleep apnea on CPAP.  He returns today for follow-up.  His CPAP download he states that he use his machine 29 out of 30 days for compliance of 97%.  He uses machine greater than 4 hours 19 days for compliance of 63%.  On average he uses his machine 4 hours 18 minutes.  His residual AHI is 4.2 on 14cmH20 of water with EPR 3.  His leak in the 95th percentile is 18.2 L/min.  Patient reports that he feels that he is doing better with using the CPAP but he still has a hard time keeping the machine staying on the machine all night long.  His Epworth sleepiness score remains elevated at 16 point.  He states that he sometimes has a hard time keeping the mask on d/t his allergies.  He returns today for evaluation.   HISTORY 12/13/2017: He reports that he could not tolerate nasal pillows and also had difficulty with a nasal mask he tried. He had mouth opening and mouth dryness at night. I reviewed his CPAP compliance data from the last 30 days, during which time he tried CPAP for days, with percent used days greater than 4 hours at 0%, indicating noncompliance. He would like to pursue repeat sleep study testing to be able to use CPAP again. He would be willing to be compliant with treatment and would like to try a full facemask this time around. His Epworth sleepiness score is 10 out of 24, fatigue score is 45 out of 63. He has had no interim changes to his medications or medical history.   REVIEW OF SYSTEMS: Out of a complete 14 system review of symptoms, the patient complains only of the following symptoms, and all other reviewed systems are negative.  Leg swelling, murmur, apnea, snoring, sleep talking, walking difficulty, nervous/anixous  ALLERGIES: Allergies  Allergen Reactions  . Lisinopril Swelling    Tongue swelling    HOME MEDICATIONS: Outpatient Medications Prior to Visit  Medication Sig Dispense Refill  . ALPRAZolam (XANAX) 0.5 MG tablet Take 0.5 mg by mouth at bedtime as needed for anxiety.    Marland Kitchen amLODipine (NORVASC) 5 MG tablet Take 5 mg by mouth daily.    Marland Kitchen FLUoxetine (PROZAC) 40 MG capsule Take 40 mg by mouth daily.    . folic acid (FOLVITE) 1 MG tablet Take 1 tablet (1 mg total) by mouth daily.    . furosemide (LASIX) 40 MG tablet Take 40 mg by mouth 2 (two) times daily.    Marland Kitchen loratadine (CLARITIN) 10 MG tablet TAKE 1 TABLET BY MOUTH EVERY DAY 30 tablet 0  . losartan (COZAAR) 100 MG tablet Take 100 mg by mouth daily.    . metFORMIN (GLUCOPHAGE) 500 MG tablet Take 1 tablet (500 mg total) by mouth 2 (two) times daily with a meal. (Patient taking differently: Take 500 mg by mouth daily with breakfast. )    . pantoprazole (PROTONIX) 40 MG tablet Take 1 tablet (40 mg total) by mouth daily. 60 tablet 2  . potassium chloride SA (K-DUR,KLOR-CON) 20 MEQ tablet Take 1 tablet (20 mEq total) by mouth daily. (Patient taking differently: Take 20 mEq by mouth 2 (two) times daily. ) 60 tablet 3  . pravastatin (PRAVACHOL) 20 MG tablet Take 1 tablet (20 mg total) by mouth daily after supper.    Marland Kitchen  sildenafil (VIAGRA) 100 MG tablet Take 100 mg by mouth daily as needed for erectile dysfunction.    Marland Kitchen warfarin (COUMADIN) 5 MG tablet Take 5 mg by mouth daily.    . meloxicam (MOBIC) 15 MG tablet Take 15 mg by mouth daily.     No facility-administered medications prior to visit.     PAST MEDICAL HISTORY: Past Medical History:  Diagnosis Date  . Adynamic ileus ([place])   . Anxiety   . Arthritis   . Atrial fibrillation ([place]) CARDIOLOGIST-- DR Einar Gip  . Barrett esophagus   . Cholelithiasis   . Chronic right-sided CHF (congestive heart failure) ([place])   . Colon polyps    TUBULAR ADENOMA (2) & HYPERPLASTIC POLYP  . Cor pulmonale, chronic ([place])   . Diabetes mellitus    NIDDM  . GERD (gastroesophageal  reflux disease)   . GI bleed   . Hypertension    benign essential hypertension  . Long-term (current) use of anticoagulants   . Morbid obesity ([place])   . OSA (obstructive sleep apnea) 08/29/2013  . OSA (obstructive sleep apnea)   . Pulmonary hypertension (HCC) MODERATE  . Pure hypercholesterolemia   . Shortness of breath    with exertion   . Sleep apnea    no CPAP  . Varicose veins of legs     PAST SURGICAL HISTORY: Past Surgical History:  Procedure Laterality Date  . benign mole removal from nose     . CARDIOVASCULAR STRESS TEST  07-19-2012  DR Einar Gip   PROMINENT DIAPHRAGMATIC ATTENUATION/ NORMAL LVEF/ LOW RISK STUDY  . COLONOSCOPY    . ESOPHAGOGASTRODUODENOSCOPY (EGD) WITH PROPOFOL N/A 07/28/2014   Procedure: ESOPHAGOGASTRODUODENOSCOPY (EGD) WITH PROPOFOL;  Surgeon: Irene Shipper, MD;  Location: [facility];  Service: Endoscopy;  Laterality: N/A;  . KNEE ARTHROSCOPY WITH MEDIAL MENISECTOMY Left 04/23/2013   Procedure: LEFT KNEE ARTHROSCOPY WITH PARTIAL MEDIAL MENISECTOMY;  Surgeon: Magnus Sinning, MD;  Location: [facility];  Service: Orthopedics;  Laterality: Left;  . MOUTH SURGERY    . TRANSTHORACIC ECHOCARDIOGRAM  05-16-2012   LOW NORMAL LVEF/ MOD. RV/ MILD HYPOKINESIS/ MOD. PULMONARY HTN/ CHRONIC COR PUMONALE/ NO SIG. CHANGE FROM 12-01-2010    FAMILY HISTORY: Family History  Problem Relation Age of Onset  . Heart disease Mother   . Hypertension Father   . Cancer Father   . Throat cancer Paternal Uncle   . Colon cancer Neg Hx   . Prostate cancer Neg Hx   . Rectal cancer Neg Hx   . Stomach cancer Neg Hx     SOCIAL HISTORY: Social History   Socioeconomic History  . Marital status: Widowed    Spouse name: Not on file  . Number of children: Not on file  . Years of education: Not on file  . Highest education level: Not on file  Occupational History  . Occupation: retired  Scientific laboratory technician  . Financial resource strain: Not on file  . Food insecurity:    Worry: Not on  file    Inability: Not on file  . Transportation needs:    Medical: Not on file    Non-medical: Not on file  Tobacco Use  . Smoking status: Former Smoker    Packs/day: 1.00    Types: Cigarettes    Last attempt to quit: 06/28/1987    Years since quitting: 30.8  . Smokeless tobacco: Never Used  Substance and Sexual Activity  . Alcohol use: Yes    Alcohol/week: 0.0 oz    Comment: pint  daily  . Drug use: No  . Sexual activity: Not on file  Lifestyle  . Physical activity:    Days per week: Not on file    Minutes per session: Not on file  . Stress: Not on file  Relationships  . Social connections:    Talks on phone: Not on file    Gets together: Not on file    Attends religious service: Not on file    Active member of club or organization: Not on file    Attends meetings of clubs or organizations: Not on file    Relationship status: Not on file  . Intimate partner violence:    Fear of current or ex partner: Not on file    Emotionally abused: Not on file    Physically abused: Not on file    Forced sexual activity: Not on file  Other Topics Concern  . Not on file  Social History Narrative  . Not on file      PHYSICAL EXAM  Vitals:   04/18/18 1305  BP: 140/79  Pulse: 61  Weight: (!) 350 lb 3.2 oz (158.8 kg)  Height: 5\' 11"  (1.803 m)   Body mass index is 48.84 kg/m.  Generalized: Well developed, in no acute distress   Neurological examination  Mentation: Alert oriented to time, place, history taking. Follows all commands speech and language fluent Cranial nerve II-XII: Pupils were equal round reactive to light. Extraocular movements were full, visual field were full on confrontational test. Facial sensation and strength were normal. Uvula tongue midline. Head turning and shoulder shrug  were normal and symmetric. Motor: The motor testing reveals 5 over 5 strength of all 4 extremities. Good symmetric motor tone is noted throughout.  Sensory: Sensory testing is intact  to soft touch on all 4 extremities. No evidence of extinction is noted.  Gait and station: Gait is normal.   DIAGNOSTIC DATA (LABS, IMAGING, TESTING) - I reviewed patient records, labs, notes, testing and imaging myself where available.  Lab Results  Component Value Date   WBC 15.6 (H) 09/15/2015   HGB 8.6 (L) 09/15/2015   HCT 25.5 (L) 09/15/2015   MCV 95.9 09/15/2015   PLT 162 09/15/2015      Component Value Date/Time   NA 137 09/16/2015 0319   K 4.5 09/16/2015 0319   CL 106 09/16/2015 0319   CO2 26 09/16/2015 0319   GLUCOSE 109 (H) 09/16/2015 0319   BUN 11 09/16/2015 0319   CREATININE 0.97 09/16/2015 0319   CALCIUM 8.5 (L) 09/16/2015 0319   PROT 5.2 (L) 09/15/2015 0312   ALBUMIN 2.7 (L) 09/15/2015 0312   AST 53 (H) 09/15/2015 0312   ALT 61 09/15/2015 0312   ALKPHOS 56 09/15/2015 0312   BILITOT 1.8 (H) 09/15/2015 0312   GFRNONAA >60 09/16/2015 0319   GFRAA >60 09/16/2015 0319   No results found for: CHOL, HDL, LDLCALC, LDLDIRECT, TRIG, CHOLHDL Lab Results  Component Value Date   HGBA1C 5.6 09/12/2015   No results found for: VITAMINB12 Lab Results  Component Value Date   TSH 1.950 07/26/2014      ASSESSMENT AND PLAN 77 y.o. year old male  has a past medical history of Adynamic ileus ([place]), Anxiety, Arthritis, Atrial fibrillation ([place]) (CARDIOLOGIST-- DR Einar Gip), Barrett esophagus, Cholelithiasis, Chronic right-sided CHF (congestive heart failure) ([place]), Colon polyps, Cor pulmonale, chronic ([place]), Diabetes mellitus, GERD (gastroesophageal reflux disease), GI bleed, Hypertension, Long-term (current) use of anticoagulants, Morbid obesity ([place]), OSA (obstructive sleep apnea) (08/29/2013), OSA (  obstructive sleep apnea), Pulmonary hypertension (HCC) (MODERATE), Pure hypercholesterolemia, Shortness of breath, Sleep apnea, and Varicose veins of legs. here with:  1.  Obstructive sleep apnea on CPAP  The patient CPAP download shows suboptimal compliance.  He does use the  machine nightly but does not use it greater than 4 hours each night.  He is encouraged to try to use machine for as long as possible.  Also encouraged the patient that the longer that he uses machine it may become more comfortable for him.  He voiced understanding.  He will follow-up in 6 months or sooner if needed.  I spent 15 minutes with the patient. 50% of this time was spent reviewing his CPAP download   Edwin Givens, MSN, NP-C 04/18/2018, 1:25 PM [facility] [address], [place], [place] [address] [phone]  I reviewed the above note and documentation by the Nurse Practitioner and agree with the history, physical exam, assessment and plan as outlined above. I was immediately available for face-to-face consultation. Star Age, MD, PhD [facility])

## 2018-04-18 NOTE — Patient Instructions (Signed)
Your Plan:  Continue using CPAP nighty and >4 hours each night  Please call [facility] at [phone], and press option 1 when prompted. Their customer service representatives will be glad to assist you. If they are unable to answer, please leave a message and they will call you back. Make sure to leave your name and return phone number.   Thank you for coming to see [place] at [facility]. I hope we have been able to provide you high quality care today.  You may receive a patient satisfaction survey over the next few weeks. We would appreciate your feedback and comments so that we may continue to improve ourselves and the health of our patients.

## 2018-04-23 ENCOUNTER — Ambulatory Visit: Payer: Self-pay | Admitting: Neurology

## 2018-10-27 ENCOUNTER — Encounter: Payer: Self-pay | Admitting: Adult Health

## 2018-10-29 ENCOUNTER — Ambulatory Visit (INDEPENDENT_AMBULATORY_CARE_PROVIDER_SITE_OTHER): Payer: MEDICARE | Admitting: Adult Health

## 2018-10-29 ENCOUNTER — Encounter: Payer: Self-pay | Admitting: Adult Health

## 2018-10-29 VITALS — BP 143/75 | HR 62 | Ht 71.0 in | Wt 356.4 lb

## 2018-10-29 DIAGNOSIS — Z6841 Body Mass Index (BMI) 40.0 and over, adult: Secondary | ICD-10-CM | POA: Diagnosis not present

## 2018-10-29 DIAGNOSIS — Z9989 Dependence on other enabling machines and devices: Secondary | ICD-10-CM

## 2018-10-29 DIAGNOSIS — G4733 Obstructive sleep apnea (adult) (pediatric): Secondary | ICD-10-CM | POA: Diagnosis not present

## 2018-10-29 NOTE — Progress Notes (Signed)
PATIENT: Edwin [name] DOB: September 09, 1941  REASON FOR VISIT: follow up HISTORY FROM: patient  HISTORY OF PRESENT ILLNESS: Today 10/29/18:  Edwin [name] is a 77 year old male with a history of obstructive sleep apnea on CPAP.  He returns today for follow-up.  His CPAP download indicates that he uses machine 30 out of 30 days for compliance of 100%.  He uses machine greater than 4 hours 28 days for compliance of 93%.  On average he uses his machine 4 hours and 58 minutes.  His residual AHI is 5 on 14 cm of water with EPR of 3.  His leak in the 95th percentile is 35.9.  He reports that if he sleeps on his side his mask may leak.Marland Kitchen  He does change out his supplies regularly.  He states that he also notices some mornings he wakes up with dry mouth.  He is unsure how to adjust his humidification.  The patient also states that he has been trying to lose weight.  He returns today for evaluation.  HISTORY 04/18/18 Edwin [name] is a 77 year old male with a history of obstructive sleep apnea on CPAP.  He returns today for follow-up.  His CPAP download he states that he use his machine 29 out of 30 days for compliance of 97%.  He uses machine greater than 4 hours 19 days for compliance of 63%.  On average he uses his machine 4 hours 18 minutes.  His residual AHI is 4.2 on 14cmH20 of water with EPR 3.  His leak in the 95th percentile is 18.2 L/min.  Patient reports that he feels that he is doing better with using the CPAP but he still has a hard time keeping the machine staying on the machine all night long.  His Epworth sleepiness score remains elevated at 16 point.  He states that he sometimes has a hard time keeping the mask on d/t his allergies.  He returns today for evaluation.  REVIEW OF SYSTEMS: Out of a complete 14 system review of symptoms, the patient complains only of the following symptoms, and all other reviewed systems are negative.  Fatigue severity score 47 Epworth sleepiness score  13  ALLERGIES: Allergies  Allergen Reactions  . Lisinopril Swelling    Tongue swelling    HOME MEDICATIONS: Outpatient Medications Prior to Visit  Medication Sig Dispense Refill  . ALPRAZolam (XANAX) 0.5 MG tablet Take 0.5 mg by mouth at bedtime as needed for anxiety.    Marland Kitchen amLODipine (NORVASC) 5 MG tablet Take 5 mg by mouth daily.    Marland Kitchen FLUoxetine (PROZAC) 40 MG capsule Take 40 mg by mouth daily.    . folic acid (FOLVITE) 1 MG tablet Take 1 tablet (1 mg total) by mouth daily.    . furosemide (LASIX) 40 MG tablet Take 40 mg by mouth 2 (two) times daily.    Marland Kitchen loratadine (CLARITIN) 10 MG tablet TAKE 1 TABLET BY MOUTH EVERY DAY 30 tablet 0  . metFORMIN (GLUCOPHAGE) 500 MG tablet Take 1 tablet (500 mg total) by mouth 2 (two) times daily with a meal. (Patient taking differently: Take 500 mg by mouth daily with breakfast. )    . pantoprazole (PROTONIX) 40 MG tablet Take 1 tablet (40 mg total) by mouth daily. 60 tablet 2  . potassium chloride SA (K-DUR,KLOR-CON) 20 MEQ tablet Take 1 tablet (20 mEq total) by mouth daily. (Patient taking differently: Take 20 mEq by mouth 2 (two) times daily. ) 60 tablet 3  . pravastatin (PRAVACHOL)  20 MG tablet Take 1 tablet (20 mg total) by mouth daily after supper.    . sildenafil (VIAGRA) 100 MG tablet Take 100 mg by mouth daily as needed for erectile dysfunction.    . valsartan (DIOVAN) 320 MG tablet Take 320 mg by mouth daily.    Marland Kitchen warfarin (COUMADIN) 5 MG tablet Take 5 mg by mouth daily.    Marland Kitchen losartan (COZAAR) 100 MG tablet Take 100 mg by mouth daily.     No facility-administered medications prior to visit.     PAST MEDICAL HISTORY: Past Medical History:  Diagnosis Date  . 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 Anxiety   . Arthritis   . Atrial fibrillation ([place]) CARDIOLOGIST-- DR Einar Gip  . Barrett esophagus   . Cholelithiasis   . Chronic right-sided CHF (congestive heart failure) ([place])   . Colon polyps    TUBULAR ADENOMA (2) & HYPERPLASTIC POLYP  . Cor  pulmonale, chronic ([place])   . Diabetes mellitus    NIDDM  . GERD (gastroesophageal reflux disease)   . GI bleed   . Hypertension    benign essential hypertension  . Long-term (current) use of anticoagulants   . Morbid obesity ([place])   . OSA (obstructive sleep apnea) 08/29/2013  . OSA (obstructive sleep apnea)   . Pulmonary hypertension (HCC) MODERATE  . Pure hypercholesterolemia   . Shortness of breath    with exertion   . Sleep apnea    no CPAP  . Varicose veins of legs     PAST SURGICAL HISTORY: Past Surgical History:  Procedure Laterality Date  . benign mole removal from nose     . CARDIOVASCULAR STRESS TEST  07-19-2012  DR Einar Gip   PROMINENT DIAPHRAGMATIC ATTENUATION/ NORMAL LVEF/ LOW RISK STUDY  . COLONOSCOPY    . ESOPHAGOGASTRODUODENOSCOPY (EGD) WITH PROPOFOL N/A 07/28/2014   Procedure: ESOPHAGOGASTRODUODENOSCOPY (EGD) WITH PROPOFOL;  Surgeon: Irene Shipper, MD;  Location: [facility];  Service: Endoscopy;  Laterality: N/A;  . KNEE ARTHROSCOPY WITH MEDIAL MENISECTOMY Left 04/23/2013   Procedure: LEFT KNEE ARTHROSCOPY WITH PARTIAL MEDIAL MENISECTOMY;  Surgeon: Magnus Sinning, MD;  Location: [facility];  Service: Orthopedics;  Laterality: Left;  . MOUTH SURGERY    . TRANSTHORACIC ECHOCARDIOGRAM  05-16-2012   LOW NORMAL LVEF/ MOD. RV/ MILD HYPOKINESIS/ MOD. PULMONARY HTN/ CHRONIC COR PUMONALE/ NO SIG. CHANGE FROM 12-01-2010    FAMILY HISTORY: Family History  Problem Relation Age of Onset  . Heart disease Mother   . Hypertension Father   . Cancer Father   . Throat cancer Paternal Uncle   . Colon cancer Neg Hx   . Prostate cancer Neg Hx   . Rectal cancer Neg Hx   . Stomach cancer Neg Hx     SOCIAL HISTORY: Social History   Socioeconomic History  . Marital status: Widowed    Spouse name: Not on file  . Number of children: Not on file  . Years of education: Not on file  . Highest education level: Not on file  Occupational History  . Occupation: retired  Scientific laboratory technician   . Financial resource strain: Not on file  . Food insecurity:    Worry: Not on file    Inability: Not on file  . Transportation needs:    Medical: Not on file    Non-medical: Not on file  Tobacco Use  . Smoking status: Former Smoker    Packs/day: 1.00    Types: Cigarettes    Last attempt to quit: 06/28/1987  Years since quitting: 31.3  . Smokeless tobacco: Never Used  Substance and Sexual Activity  . Alcohol use: Yes    Alcohol/week: 0.0 standard drinks    Comment: pint daily  . Drug use: No  . Sexual activity: Not on file  Lifestyle  . Physical activity:    Days per week: Not on file    Minutes per session: Not on file  . Stress: Not on file  Relationships  . Social connections:    Talks on phone: Not on file    Gets together: Not on file    Attends religious service: Not on file    Active member of club or organization: Not on file    Attends meetings of clubs or organizations: Not on file    Relationship status: Not on file  . Intimate partner violence:    Fear of current or ex partner: Not on file    Emotionally abused: Not on file    Physically abused: Not on file    Forced sexual activity: Not on file  Other Topics Concern  . Not on file  Social History Narrative  . Not on file      PHYSICAL EXAM  Vitals:   10/29/18 1409  BP: (!) 143/75  Pulse: 62  Weight: (!) 356 lb 6.4 oz (161.7 kg)  Height: 5\' 11"  (1.803 m)   Body mass index is 49.71 kg/m.  Generalized: Well developed, in no acute distress   Neurological examination  Mentation: Alert oriented to time, place, history taking. Follows all commands speech and language fluent Cranial nerve II-XII: Pupils were equal round reactive to light. Extraocular movements were full, visual field were full on confrontational test. Facial sensation and strength were normal. Uvula tongue midline. Head turning and shoulder shrug  were normal and symmetric.  Neck circumference 21 inches, Mallampati 4+ Motor: The  motor testing reveals 5 over 5 strength of all 4 extremities. Good symmetric motor tone is noted throughout.  Sensory: Sensory testing is intact to soft touch on all 4 extremities. No evidence of extinction is noted.  Gait and station: Gait is normal.   DIAGNOSTIC DATA (LABS, IMAGING, TESTING) - I reviewed patient records, labs, notes, testing and imaging myself where available.  Lab Results  Component Value Date   WBC 15.6 (H) 09/15/2015   HGB 8.6 (L) 09/15/2015   HCT 25.5 (L) 09/15/2015   MCV 95.9 09/15/2015   PLT 162 09/15/2015      Component Value Date/Time   NA 137 09/16/2015 0319   K 4.5 09/16/2015 0319   CL 106 09/16/2015 0319   CO2 26 09/16/2015 0319   GLUCOSE 109 (H) 09/16/2015 0319   BUN 11 09/16/2015 0319   CREATININE 0.97 09/16/2015 0319   CALCIUM 8.5 (L) 09/16/2015 0319   PROT 5.2 (L) 09/15/2015 0312   ALBUMIN 2.7 (L) 09/15/2015 0312   AST 53 (H) 09/15/2015 0312   ALT 61 09/15/2015 0312   ALKPHOS 56 09/15/2015 0312   BILITOT 1.8 (H) 09/15/2015 0312   GFRNONAA >60 09/16/2015 0319   GFRAA >60 09/16/2015 0319    Lab Results  Component Value Date   HGBA1C 5.6 09/12/2015    Lab Results  Component Value Date   TSH 1.950 07/26/2014      ASSESSMENT AND PLAN 77 y.o. year old male  has a past medical history of Adynamic ileus ([place]), Anxiety, Arthritis, Atrial fibrillation ([place]) (CARDIOLOGIST-- DR Einar Gip), Barrett esophagus, Cholelithiasis, Chronic right-sided CHF (congestive heart failure) ([place]), Colon polyps,  Cor pulmonale, chronic ([place]), Diabetes mellitus, GERD (gastroesophageal reflux disease), GI bleed, Hypertension, Long-term (current) use of anticoagulants, Morbid obesity ([place]), OSA (obstructive sleep apnea) (08/29/2013), OSA (obstructive sleep apnea), Pulmonary hypertension ([place]) (MODERATE), Pure hypercholesterolemia, Shortness of breath, Sleep apnea, and Varicose veins of legs. here with :  1.  Obstructive sleep apnea on CPAP 2.  Obesity  The patient  CPAP download shows excellent compliance and good treatment of his apnea.  He is encouraged to call his DME company or take his machine there for them to adjust his humidification.  I will refer the patient to [facility].  He is advised that if his symptoms worsen or he develops new symptoms he should let us know.    Ward Givens, MSN, NP-C 10/29/2018, 2:39 PM [facility] [address] [place], [place] [address] [phone]

## 2018-10-29 NOTE — Patient Instructions (Signed)

## 2019-01-03 ENCOUNTER — Telehealth: Payer: Self-pay | Admitting: Internal Medicine

## 2019-01-03 NOTE — Telephone Encounter (Signed)
-----   Message from Gatha Mayer, MD sent at 01/01/2019 12:31 PM EST ----- Regarding: RE: New Pt Former PT of Dr. Olevia Perches I will see him.  Thanks for checking ----- Message ----- From: Ronita Hipps Sent: 12/31/2018   4:54 PM EST To: Gatha Mayer, MD Subject: New Pt Former PT of Dr. Shelby Dubin Dr. Carlean Purl   I have a previous Patient of Dr. Olevia Perches that will be coming in for a follow up for a colonoscopy. Are you okay with taking in a New Patient?

## 2019-01-16 ENCOUNTER — Ambulatory Visit (INDEPENDENT_AMBULATORY_CARE_PROVIDER_SITE_OTHER): Payer: MEDICARE | Admitting: Internal Medicine

## 2019-01-16 ENCOUNTER — Encounter: Payer: Self-pay | Admitting: Internal Medicine

## 2019-01-16 VITALS — BP 130/60 | HR 68 | Ht 68.5 in | Wt 353.1 lb

## 2019-01-16 DIAGNOSIS — Z7901 Long term (current) use of anticoagulants: Secondary | ICD-10-CM

## 2019-01-16 DIAGNOSIS — I4891 Unspecified atrial fibrillation: Secondary | ICD-10-CM | POA: Diagnosis not present

## 2019-01-16 DIAGNOSIS — Z86010 Personal history of colonic polyps: Secondary | ICD-10-CM | POA: Diagnosis not present

## 2019-01-16 NOTE — Progress Notes (Signed)
Edwin [name] 78 y.o. 10/18/1941 [id number]  Assessment & Plan:   Encounter Diagnoses  Name Primary?  Marland Kitchen Hx of adenomatous colonic polyps Yes  . Atrial fibrillation, unspecified type ([place])   . Warfarin anticoagulation     He is in the range to consider having a repeat colonoscopy but also is >75 with significant co-morbidities. We did not make a decision today - he will see Dr. Coletta Memos and discuss with him. I would not do stool based tests in his case (anti-coag Tx and increased false +)  He has BMI > 50and would have to be done at hosiptal also  I told him he is at increased risk of sedation problems and would also be at risk of stroke when off warfarin though that is very rare it is possible and would be less if we bridged him.  I did not realize at the time of his visit that we have new polyp f/u guidelines and since he had 2 < 1 cm adenomas earliest to consider would be 7 years - I am aware but did not remember this when he was here. I will call him and explain that based upon the newer gudielines my recommendation is to stop routine colonoscopy - he may still discuss with Dr. Beatris Si, MD, [facility]   Subjective:   Chief Complaint: hx colon polyps  HPI Patient is here w/ hx adenomatous colon polyps - last colonoscopy by Dr. Olevia Perches in 2015 - 2 adenomas max 9 mm and a distal hyperplastic polyp. He is not having any active GI sxs - no abdominal pain or bowel habit changes or rectal bleeding. He wonders if he is too old to have a colonoscopy. He has A fib on warfarin and is followed by Dr. Einar Gip - he was bridged with Lovenox at last colonoscopy. He has OSA and is on CPAP. He is very obese and is trying to lose weight. Able to walk up one flight of steps, is able to lie flat. Allergies  Allergen Reactions  . Lisinopril Swelling    Tongue swelling   Current Meds  Medication Sig  . ALPRAZolam (XANAX) 0.5 MG tablet Take 0.5 mg by mouth at bedtime as needed for  anxiety.  Marland Kitchen amLODipine (NORVASC) 5 MG tablet Take 5 mg by mouth daily.  Marland Kitchen FLUoxetine (PROZAC) 40 MG capsule Take 40 mg by mouth daily.  . folic acid (FOLVITE) 1 MG tablet Take 1 tablet (1 mg total) by mouth daily.  . furosemide (LASIX) 40 MG tablet Take 40 mg by mouth 2 (two) times daily.  Marland Kitchen loratadine (CLARITIN) 10 MG tablet TAKE 1 TABLET BY MOUTH EVERY DAY  . metFORMIN (GLUCOPHAGE) 500 MG tablet Take 1 tablet (500 mg total) by mouth 2 (two) times daily with a meal. (Patient taking differently: Take 500 mg by mouth daily with breakfast. )  . pantoprazole (PROTONIX) 40 MG tablet Take 1 tablet (40 mg total) by mouth daily.  . potassium chloride SA (K-DUR,KLOR-CON) 20 MEQ tablet Take 1 tablet (20 mEq total) by mouth daily.  . pravastatin (PRAVACHOL) 20 MG tablet Take 1 tablet (20 mg total) by mouth daily after supper.  . sildenafil (VIAGRA) 100 MG tablet Take 100 mg by mouth daily as needed for erectile dysfunction.  . valsartan (DIOVAN) 320 MG tablet Take 320 mg by mouth daily.  . Warfarin Sodium (COUMADIN PO) Take by mouth as directed.   Past Medical History:  Diagnosis Date  . 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  Anxiety   . Arthritis   . Atrial fibrillation ([place]) CARDIOLOGIST-- DR Einar Gip  . Cholelithiasis   . Chronic right-sided CHF (congestive heart failure) ([place])   . Colon polyps    TUBULAR ADENOMA (2) & HYPERPLASTIC POLYP  . Cor pulmonale, chronic ([place])   . Diabetes mellitus    NIDDM  . Gastric ulcer 07/28/2014  . GERD (gastroesophageal reflux disease)   . GI bleed   . Hypertension    benign essential hypertension  . Long-term (current) use of anticoagulants   . Morbid obesity ([place])   . OSA (obstructive sleep apnea) 08/29/2013  . OSA (obstructive sleep apnea)   . Pulmonary hypertension ([facility]) MODERATE  . Pure hypercholesterolemia   . Shortness of breath    with exertion   . Sleep apnea    no CPAP  . Varicose veins of legs    Past Surgical History:  Procedure Laterality Date  .  benign mole removal from nose     . CARDIOVASCULAR STRESS TEST  07-19-2012  DR Einar Gip   PROMINENT DIAPHRAGMATIC ATTENUATION/ NORMAL LVEF/ LOW RISK STUDY  . COLONOSCOPY    . ESOPHAGOGASTRODUODENOSCOPY (EGD) WITH PROPOFOL N/A 07/28/2014   Procedure: ESOPHAGOGASTRODUODENOSCOPY (EGD) WITH PROPOFOL;  Surgeon: Irene Shipper, MD;  [facility];  Service: Endoscopy;  Laterality: N/A;  . KNEE ARTHROSCOPY WITH MEDIAL MENISECTOMY Left 04/23/2013   Procedure: LEFT KNEE ARTHROSCOPY WITH PARTIAL MEDIAL MENISECTOMY;  Surgeon: Magnus Sinning, MD;  Location: [facility];  Service: Orthopedics;  Laterality: Left;  . MOUTH SURGERY    . TRANSTHORACIC ECHOCARDIOGRAM  05-16-2012   LOW NORMAL LVEF/ MOD. RV/ MILD HYPOKINESIS/ MOD. PULMONARY HTN/ CHRONIC COR PUMONALE/ NO SIG. CHANGE FROM 12-01-2010   Social History   Social History Narrative   Widowed and retired   No tobacco, + ETOH no drugs   family history includes Cancer in his father; Heart disease in his mother; Hypertension in his father; Throat cancer in his paternal uncle.   Review of Systems As per HPI  Objective:   Physical Exam BP 130/60 (BP Location: Left Arm, Patient Position: Sitting, Cuff Size: Normal)   Pulse 68   Ht 5' 8.5" (1.74 m) Comment: height measured without shoes  Wt (!) 353 lb 2 oz (160.2 kg)   BMI 52.91 kg/m  Morbidly obese Lungs CTA Cor RRR, 2/6 sys m abd very obese, soft and NT BS + Pleasant alert and oriented

## 2019-01-16 NOTE — Patient Instructions (Signed)
   Nice to meet you today.  Please get Dr. Chales Abrahams opinion regarding having a colonoscopy as we discussed.  As I said I think your situation is one where it is reasonable to consider one more colonoscopy but there are also reasons not to do one.  I will send Dr. Coletta Memos my note.  If you and he decide to proceed with one call us and we will arrange it.  I appreciate the opportunity to care for you. Gatha Mayer, MD, [facility]

## 2019-01-17 ENCOUNTER — Telehealth: Payer: Self-pay

## 2019-01-17 NOTE — Telephone Encounter (Signed)
-----   Message from Gatha Mayer, MD sent at 01/17/2019  1:09 PM EST ----- Regarding: New polyp guidelines Please contact him and explain that new guidelines have just come out which would say that he should wait 7 years after last colonoscopy to repeat colonoscopy.  I knew about these when I saw him but they have not "sunk in' yet.  So given that 7 year follow-up is now recommended and ptiing everything into contect I do not recommend repeating colonoscopy unless he has signs or symptoms that warrant.  He can still talk to Dr. Coletta Memos and I will respond.  CEG

## 2019-01-17 NOTE — Telephone Encounter (Signed)
Patient informed and he said he will still talk with Dr Coletta Memos as you discussed yesterday.

## 2019-01-28 ENCOUNTER — Other Ambulatory Visit: Payer: Self-pay

## 2019-01-28 MED ORDER — AMLODIPINE BESYLATE 5 MG PO TABS: 5.0000 mg | ORAL_TABLET | Freq: Every day | ORAL | 2 refills | Status: DC

## 2019-02-03 ENCOUNTER — Other Ambulatory Visit: Payer: Self-pay | Admitting: Cardiology

## 2019-02-04 ENCOUNTER — Other Ambulatory Visit: Payer: Self-pay | Admitting: Cardiology

## 2019-02-04 ENCOUNTER — Telehealth: Payer: Self-pay

## 2019-02-04 DIAGNOSIS — I10 Essential (primary) hypertension: Secondary | ICD-10-CM

## 2019-02-04 NOTE — Telephone Encounter (Signed)
Please disregard msg  

## 2019-04-16 ENCOUNTER — Ambulatory Visit (INDEPENDENT_AMBULATORY_CARE_PROVIDER_SITE_OTHER): Payer: MEDICARE | Admitting: Cardiology

## 2019-04-16 ENCOUNTER — Other Ambulatory Visit: Payer: Self-pay

## 2019-04-16 ENCOUNTER — Encounter: Payer: Self-pay | Admitting: Cardiology

## 2019-04-16 VITALS — Ht 68.5 in | Wt 338.0 lb

## 2019-04-16 DIAGNOSIS — G4733 Obstructive sleep apnea (adult) (pediatric): Secondary | ICD-10-CM | POA: Diagnosis not present

## 2019-04-16 DIAGNOSIS — I4821 Permanent atrial fibrillation: Secondary | ICD-10-CM | POA: Diagnosis not present

## 2019-04-16 DIAGNOSIS — I10 Essential (primary) hypertension: Secondary | ICD-10-CM

## 2019-04-16 DIAGNOSIS — E119 Type 2 diabetes mellitus without complications: Secondary | ICD-10-CM

## 2019-04-16 NOTE — Progress Notes (Signed)
Virtual Visit via Video Note: This visit type was conducted due to national recommendations for restrictions regarding the COVID-19 Pandemic (e.g. social distancing).  This format is felt to be most appropriate for this patient at this time.  All issues noted in this document were discussed and addressed.  No physical exam was performed (except for noted visual exam findings with Telehealth visits).  The patient has consented to conduct a Telehealth visit and understands insurance will be billed.   I connected with@, on 04/16/19 at  by a video enabled telemedicine application and verified that I am speaking with the correct person using two identifiers.   I discussed the limitations of evaluation and management by telemedicine and the availability of in person appointments. The patient expressed understanding and agreed to proceed.   I have discussed with patient regarding the safety during COVID Pandemic and steps and precautions to be taken including social distancing, frequent hand wash and use of detergent soap, gels with the patient. I asked the patient to avoid touching mouth, nose, eyes, ears with the hands. I encouraged regular walking around the neighborhood and exercise and regular diet, as long as social distancing can be maintained.  Primary Physician/Referring:  Bernerd Limbo, MD  Patient ID: Edwin [name], male    DOB: [birth year], 78 y.o.   MRN: [id number]  Chief Complaint  Patient presents with  . Hypertension    6 month     HPI: Edwin [name]  is a 78 y.o. male  with  morbid obesity, OSA on CPAP, hypertension, chronic leg edema, diabetes mellitus, and permanent atrial fibrillation on Coumadin. Patient is here on a 6 month follow up for hypertension and chronic atrial fibrillation.   With Valsartan, his BP has been well controlled. He has lost 16-20 Lbs over the past 3 months with diet.  He is not abusing alcohol now. No shortness breath except for chronic dyspnea, denies any  chest pain. Leg edema has remained stable and slightly improved with weight loss..  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 He has mostly remained abstinant from alcohol use but states he drinks one to beers occasionally when he meets friends and family but does not drink at home alone, he is practicing social distancing. No new symptomatology. No dizziness or syncope. No GI Bleed.  Past Medical History:  Diagnosis Date  . Adynamic ileus ([place])   . Anxiety   . Arthritis   . Atrial fibrillation ([place]) CARDIOLOGIST-- DR Einar Gip  . Cholelithiasis   . Chronic right-sided CHF (congestive heart failure) ([place])   . Colon polyps    TUBULAR ADENOMA (2) & HYPERPLASTIC POLYP  . Cor pulmonale, chronic ([place])   . Diabetes mellitus    NIDDM  . Gastric ulcer 07/28/2014  . GERD (gastroesophageal reflux disease)   . GI bleed   . Hypertension    benign essential hypertension  . Long-term (current) use of anticoagulants   . Morbid obesity ([place])   . OSA (obstructive sleep apnea) 08/29/2013  . OSA (obstructive sleep apnea)   . Pulmonary hypertension (HCC) MODERATE  . Pure hypercholesterolemia   . Shortness of breath    with exertion   . Sleep apnea    no CPAP  . Varicose veins of legs     Past Surgical History:  Procedure Laterality Date  . benign mole removal from nose     . CARDIOVASCULAR STRESS TEST  07-19-2012  DR Einar Gip   PROMINENT DIAPHRAGMATIC ATTENUATION/ NORMAL LVEF/ LOW RISK  STUDY  . COLONOSCOPY    . ESOPHAGOGASTRODUODENOSCOPY (EGD) WITH PROPOFOL N/A 07/28/2014   Procedure: ESOPHAGOGASTRODUODENOSCOPY (EGD) WITH PROPOFOL;  Surgeon: Irene Shipper, MD;  Location: [facility];  Service: Endoscopy;  Laterality: N/A;  . KNEE ARTHROSCOPY WITH MEDIAL MENISECTOMY Left 04/23/2013   Procedure: LEFT KNEE ARTHROSCOPY WITH PARTIAL MEDIAL MENISECTOMY;  Surgeon: Magnus Sinning, MD;  Location: [facility];  Service: Orthopedics;  Laterality: Left;  . MOUTH SURGERY    . TRANSTHORACIC  ECHOCARDIOGRAM  05-16-2012   LOW NORMAL LVEF/ MOD. RV/ MILD HYPOKINESIS/ MOD. PULMONARY HTN/ CHRONIC COR PUMONALE/ NO SIG. CHANGE FROM 12-01-2010    Social History   Socioeconomic History  . Marital status: Widowed    Spouse name: Not on file  . Number of children: Not on file  . Years of education: Not on file  . Highest education level: Not on file  Occupational History  . Occupation: retired  Scientific laboratory technician  . Financial resource strain: Not on file  . Food insecurity:    Worry: Not on file    Inability: Not on file  . Transportation needs:    Medical: Not on file    Non-medical: Not on file  Tobacco Use  . Smoking status: Former Smoker    Packs/day: 1.00    Types: Cigarettes    Last attempt to quit: 06/28/1987    Years since quitting: 31.8  . Smokeless tobacco: Never Used  Substance and Sexual Activity  . Alcohol use: Yes    Alcohol/week: 0.0 standard drinks    Comment: pint daily  . Drug use: No  . Sexual activity: Not on file  Lifestyle  . Physical activity:    Days per week: Not on file    Minutes per session: Not on file  . Stress: Not on file  Relationships  . Social connections:    Talks on phone: Not on file    Gets together: Not on file    Attends religious service: Not on file    Active member of club or organization: Not on file    Attends meetings of clubs or organizations: Not on file    Relationship status: Not on file  . Intimate partner violence:    Fear of current or ex partner: Not on file    Emotionally abused: Not on file    Physically abused: Not on file    Forced sexual activity: Not on file  Other Topics Concern  . Not on file  Social History Narrative   Widowed and retired   No tobacco, + ETOH no drugs    Review of Systems  Constitution: Positive for weight loss (intentional). Negative for chills, decreased appetite, malaise/fatigue and weight gain.  Cardiovascular: Positive for dyspnea on exertion and leg swelling. Negative for  syncope.  Endocrine: Negative for cold intolerance.  Hematologic/Lymphatic: Does not bruise/bleed easily.  Musculoskeletal: Negative for joint swelling.  Gastrointestinal: Negative for abdominal pain, anorexia, change in bowel habit, hematochezia and melena.  Neurological: Negative for headaches and light-headedness.  Psychiatric/Behavioral: Positive for depression (stable). Negative for substance abuse.  All other systems reviewed and are negative.     Objective  Height 5' 8.5" (1.74 m), weight (!) 338 lb (153.3 kg). Body mass index is 50.65 kg/m.  Physical exam not performed or limited due to virtual visit.  Patient appeared to be in no distress, Neck was supple, respiration was not labored.  Please see exam details from prior visit is as below.    Physical Exam  Constitutional: He appears well-developed. No distress.  Morbidly obese  HENT:  Head: Atraumatic.  Eyes: Conjunctivae are normal.  Neck: Neck supple. No JVD present. No thyromegaly present.  Short neck and difficult to evaluate JVP  Cardiovascular: Normal rate, normal heart sounds and intact distal pulses. An irregular rhythm present. Exam reveals no gallop, no S3 and no S4.  Pulses:      Carotid pulses are 2+ on the right side and 2+ on the left side.      Dorsalis pedis pulses are 2+ on the right side and 2+ on the left side.       Posterior tibial pulses are 2+ on the right side and 2+ on the left side.  Femoral and popliteal pulse difficult to feel due to patient's body habitus. 2 to 3+ minimally  Pitting edema. Peu-de-orange appearance, chronic venostasis noted.    Pulmonary/Chest: Effort normal and breath sounds normal.  Abdominal: Soft. Bowel sounds are normal.  Obese. Pannus present  Musculoskeletal: Normal range of motion.        General: No edema.  Neurological: He is alert.  Skin: Skin is warm and dry.  Psychiatric: He has a normal mood and affect.   Radiology: No results found.  Laboratory  examination:    Labs 10/23/2018: Hb 13.3, HCT 40.7, platelets 225.  BUN 14, creatinine 1.14, EGFR greater than 60 mL, potassium 4.6.  AST minimally elevated at 44.  CMP otherwise normal.  A1c 6.5%.  Cholesterol 133, triglyceride 79, HDL 42, LDL 71.  PRN Meds:. Medications Discontinued During This Encounter  Medication Reason  . valsartan (DIOVAN) 756 MG tablet Duplicate   Current Meds  Medication Sig  . ALPRAZolam (XANAX) 0.5 MG tablet Take 0.5 mg by mouth at bedtime as needed for anxiety.  Marland Kitchen amLODipine (NORVASC) 5 MG tablet Take 1 tablet (5 mg total) by mouth daily.  Marland Kitchen FLUoxetine (PROZAC) 40 MG capsule Take 40 mg by mouth daily.  . folic acid (FOLVITE) 1 MG tablet Take 1 tablet (1 mg total) by mouth daily.  . furosemide (LASIX) 40 MG tablet Take 80 mg by mouth 2 (two) times daily.   Marland Kitchen loratadine (CLARITIN) 10 MG tablet TAKE 1 TABLET BY MOUTH EVERY DAY  . metFORMIN (GLUCOPHAGE) 500 MG tablet Take 1 tablet (500 mg total) by mouth 2 (two) times daily with a meal. (Patient taking differently: Take 500 mg by mouth daily with breakfast. )  . pantoprazole (PROTONIX) 40 MG tablet Take 1 tablet (40 mg total) by mouth daily.  . potassium chloride SA (K-DUR,KLOR-CON) 20 MEQ tablet Take 1 tablet (20 mEq total) by mouth daily.  . pravastatin (PRAVACHOL) 20 MG tablet Take 1 tablet (20 mg total) by mouth daily after supper.  . sildenafil (VIAGRA) 100 MG tablet Take 100 mg by mouth daily as needed for erectile dysfunction.  . Warfarin Sodium (COUMADIN PO) Take by mouth as directed.    Cardiac Studies:   Lexiscan stress 07/19/12:  Prominent diaphragmatic attenuation. LVEF normal. Low risk stress.   Caotid duplex 05/29/11:  Bilateral carotid tortuosity   Echocardiogram 09/13/2015 :   - Left ventricle: The cavity size was normal. Systolic function was  normal. The estimated ejection fraction was in the range of 60%  to 65%. Wall motion was normal; there were no regional wall  motion abnormalities. -  Right ventricle: Poorly visualized.  Assessment   Permanent atrial fibrillation  Essential hypertension  OSA (obstructive sleep apnea)  Type 2 diabetes mellitus without complication, without long-term  current use of insulin ([place])  EKG 09/05/2018: Atrial fibrillation controlled and corresponds at the rate of 52 bpm, normal axis. No evidence of ischemia. Low-voltage complexes. No significant change from EKG 09/03/2017.  Recommendations:   Patient with permanent atrial fibrillation presenting for 6 month office visit, it is a virtual visit.  He is presently doing well and is appropriately treated with Coumadin and INR has been therapeutic and being managed by PCP.  Since being on valsartan blood pressure is also well controlled.  I discussed compliance with CPAP, he is been compliant, encouraged him to continue to lose weight, his lost about 16-20 pounds in weight over the past 3 months with diet.  He has been practicing social distancing.  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  No bleeding complications on Coumadin.  Diabetes is also well controlled.  With regard to hyperlipidemia, all labs are within normal limits.  I reviewed the labs and the PCP.  I'll like to see him back in 6 months for follow-up.  Adrian Prows, MD, [facility] 04/16/2019, 3:58 PM [facility]. [place] Pager: [phone] Office: [phone] If no answer Cell [phone]

## 2019-07-07 ENCOUNTER — Telehealth: Payer: Self-pay

## 2019-07-07 NOTE — Telephone Encounter (Signed)
Tanzania from [facility] called to speak with you about a script that has been pending since the 10th. Please call at your convenience.

## 2019-07-07 NOTE — Telephone Encounter (Signed)
Order has been completed.

## 2019-08-13 ENCOUNTER — Other Ambulatory Visit: Payer: Self-pay

## 2019-08-13 MED ORDER — VALSARTAN 320 MG PO TABS: 320.0000 mg | ORAL_TABLET | Freq: Every day | ORAL | Status: DC

## 2019-08-13 MED ORDER — VALSARTAN 320 MG PO TABS: 320.0000 mg | ORAL_TABLET | Freq: Every day | ORAL | 0 refills | Status: DC

## 2019-08-14 ENCOUNTER — Other Ambulatory Visit: Payer: Self-pay

## 2019-08-14 ENCOUNTER — Ambulatory Visit: Payer: Self-pay

## 2019-08-27 ENCOUNTER — Other Ambulatory Visit: Payer: Self-pay | Admitting: Cardiology

## 2019-10-20 ENCOUNTER — Other Ambulatory Visit: Payer: Self-pay

## 2019-10-20 ENCOUNTER — Ambulatory Visit (INDEPENDENT_AMBULATORY_CARE_PROVIDER_SITE_OTHER): Payer: MEDICARE | Admitting: Cardiology

## 2019-10-20 ENCOUNTER — Encounter: Payer: Self-pay | Admitting: Cardiology

## 2019-10-20 VITALS — BP 134/76 | HR 48 | Temp 98.1°F | Ht 71.0 in | Wt 332.0 lb

## 2019-10-20 DIAGNOSIS — I10 Essential (primary) hypertension: Secondary | ICD-10-CM

## 2019-10-20 DIAGNOSIS — G4733 Obstructive sleep apnea (adult) (pediatric): Secondary | ICD-10-CM | POA: Diagnosis not present

## 2019-10-20 DIAGNOSIS — I4821 Permanent atrial fibrillation: Secondary | ICD-10-CM | POA: Diagnosis not present

## 2019-10-20 NOTE — Progress Notes (Signed)
Primary Physician/Referring:  Bernerd Limbo, MD  Patient ID: Edwin [name], male    DOB: 09/09/1941, 78 y.o.   MRN: [id number]  Chief Complaint  Patient presents with  . Hypertension  . Atrial Fibrillation  . Follow-up    25mo   HPI: Edwin [name] is a 78y.o. male  with  morbid obesity, OSA on CPAP, hypertension, chronic leg edema, diabetes mellitus, and permanent atrial fibrillation on Coumadin. Patient is here on a 6 month follow up for hypertension and chronic atrial fibrillation.   With Valsartan, his BP has been well controlled. He has lost 20-25  Lbs over the past 6 months with diet.  He is not abusing alcohol now. No shortness breath except for chronic dyspnea, denies any chest pain. Leg edema has remained stable and slightly improved with weight loss..Marland Kitchen He is practicing social distancing. No new symptomatology. No dizziness or syncope. No GI Bleed.  Past Medical History:  Diagnosis Date  . Adynamic ileus ([place]   . Anxiety   . Arthritis   . Atrial fibrillation ([place] CARDIOLOGIST-- DR GEinar Gip . Cholelithiasis   . Chronic right-sided CHF (congestive heart failure) ([address]   . Colon polyps    TUBULAR ADENOMA (2) & HYPERPLASTIC POLYP  . Cor pulmonale, chronic ([place]   . Diabetes mellitus    NIDDM  . Gastric ulcer 07/28/2014  . GERD (gastroesophageal reflux disease)   . GI bleed   . Hypertension    benign essential hypertension  . Long-term (current) use of anticoagulants   . Morbid obesity ([place]   . OSA (obstructive sleep apnea) 08/29/2013  . OSA (obstructive sleep apnea)   . Pulmonary hypertension (HCC) MODERATE  . Pure hypercholesterolemia   . Shortness of breath    with exertion   . Sleep apnea    no CPAP  . Varicose veins of legs     Past Surgical History:  Procedure Laterality Date  . benign mole removal from nose     . CARDIOVASCULAR STRESS TEST  07-19-2012  DR GEinar Gip  PROMINENT DIAPHRAGMATIC ATTENUATION/ NORMAL LVEF/ LOW RISK STUDY  . COLONOSCOPY    .  ESOPHAGOGASTRODUODENOSCOPY (EGD) WITH PROPOFOL N/A 07/28/2014   Procedure: ESOPHAGOGASTRODUODENOSCOPY (EGD) WITH PROPOFOL;  Surgeon: JIrene Shipper MD;  [facility];  Service: Endoscopy;  Laterality: N/A;  . KNEE ARTHROSCOPY WITH MEDIAL MENISECTOMY Left 04/23/2013   Procedure: LEFT KNEE ARTHROSCOPY WITH PARTIAL MEDIAL MENISECTOMY;  Surgeon: JMagnus Sinning MD;  Location: [facility];  Service: Orthopedics;  Laterality: Left;  . MOUTH SURGERY    . TRANSTHORACIC ECHOCARDIOGRAM  05-16-2012   LOW NORMAL LVEF/ MOD. RV/ MILD HYPOKINESIS/ MOD. PULMONARY HTN/ CHRONIC COR PUMONALE/ NO SIG. CHANGE FROM 12-01-2010    Social History   Socioeconomic History  . Marital status: Widowed    Spouse name: Not on file  . Number of children: 4  . Years of education: Not on file  . Highest education level: Not on file  Occupational History  . Occupation: retired  SScientific laboratory technician . Financial resource strain: Not on file  . Food insecurity    Worry: Not on file    Inability: Not on file  . Transportation needs    Medical: Not on file    Non-medical: Not on file  Tobacco Use  . Smoking status: Former Smoker    Packs/day: 1.00    Years: 20.00    Pack years: 20.00    Types: Cigarettes    Quit date:  06/27/1985    Years since quitting: 34.3  . Smokeless tobacco: Never Used  Substance and Sexual Activity  . Alcohol use: Yes    Alcohol/week: 0.0 standard drinks    Comment: beer 3-4 daily  . Drug use: No  . Sexual activity: Not on file  Lifestyle  . Physical activity    Days per week: Not on file    Minutes per session: Not on file  . Stress: Not on file  Relationships  . Social Herbalist on phone: Not on file    Gets together: Not on file    Attends religious service: Not on file    Active member of club or organization: Not on file    Attends meetings of clubs or organizations: Not on file    Relationship status: Not on file  . Intimate partner violence    Fear of current or ex  partner: Not on file    Emotionally abused: Not on file    Physically abused: Not on file    Forced sexual activity: Not on file  Other Topics Concern  . Not on file  Social History Narrative   Widowed and retired   No tobacco, + ETOH no drugs   Review of Systems  Constitution: Positive for weight loss (intentional). Negative for chills, decreased appetite, malaise/fatigue and weight gain.  Cardiovascular: Positive for dyspnea on exertion and leg swelling. Negative for syncope.  Endocrine: Negative for cold intolerance.  Hematologic/Lymphatic: Does not bruise/bleed easily.  Musculoskeletal: Negative for joint swelling.  Gastrointestinal: Negative for abdominal pain, anorexia, change in bowel habit, hematochezia and melena.  Neurological: Negative for headaches and light-headedness.  Psychiatric/Behavioral: Positive for depression (stable). Negative for substance abuse.  All other systems reviewed and are negative.  Objective  Blood pressure 134/76, pulse (!) 48, temperature 98.1 F (36.7 C), height 5' 11"  (1.803 m), weight (!) 332 lb (150.6 kg), SpO2 92 %. Body mass index is 46.3 kg/m.  Vitals with BMI 10/20/2019 04/16/2019 01/16/2019  Height 5' 11"  5' 8.5" 5' 8.5"  Weight 332 lbs 338 lbs 353 lbs 2 oz  BMI 46.33 46.80 32.12  Systolic 248 - 250  Diastolic 76 - 60  Pulse 48 - 68   Physical Exam  Constitutional: He appears well-developed. No distress.  Morbidly obese  HENT:  Head: Atraumatic.  Eyes: Conjunctivae are normal.  Neck: Neck supple. No JVD present. No thyromegaly present.  Short neck and difficult to evaluate JVP  Cardiovascular: Normal rate and normal heart sounds. An irregular rhythm present. Exam reveals no gallop, no S3 and no S4.  Pulses:      Carotid pulses are 2+ on the right side and 2+ on the left side.      Dorsalis pedis pulses are 0 on the right side and 0 on the left side.       Posterior tibial pulses are 0 on the right side and 0 on the left side.   Femoral and popliteal pulse difficult to feel due to patient's body habitus. Pedal pulses could not be felt due to edema. 2 to 3+  Pitting edema. Peu-de-orange appearance, chronic venostasis noted.    Pulmonary/Chest: Effort normal and breath sounds normal.  Abdominal: Soft. Bowel sounds are normal.  Obese. Pannus present  Musculoskeletal: Normal range of motion.        General: No edema.  Neurological: He is alert.  Skin: Skin is warm and dry.  Psychiatric: He has a normal mood and affect.  Radiology: No results found.  Laboratory examination:   Comprehensive Metabolic PanelResulted: 12/28/4130 6:36 AM [facility] Component Name Value Ref Range  Glucose 105 (H) 65 - 99 mg/dL  BUN 16 8 - 27 mg/dL  Creatinine, Serum 0.98 0.76 - 1.27 mg/dL  eGFR If NonAfrican American 74 >59 mL/min/1.73  eGFR If African American 86 >59 mL/min/1.73  BUN/Creatinine Ratio 16 10 - 24   Sodium 136 134 - 144 mmol/L  Potassium 4.5 3.5 - 5.2 mmol/L  Chloride 97 96 - 106 mmol/L  CO2 26 20 - 29 mmol/L  CALCIUM 9.2 8.6 - 10.2 mg/dL  Total Protein 7.3 6 - 8.5 g/dL  Albumin, Serum 4.1 3.7 - 4.7 g/dL  Globulin, Total 3.2 1.5 - 4.5 g/dL  Albumin/Globulin Ratio 1.3 1.2 - 2.2   Total Bilirubin 0.9 0 - 1.2 mg/dL  Alkaline Phosphatase 108 39 - 117 IU/L  AST 27 0 - 40 IU/L  ALT (SGPT) 18 0 - 44 IU/L   RBC 4.29 (A) 4.50 - 5.30 x10E6/uL [facility]   HGB 13.3 (A) 13.5 - 17.5 g/dL [facility]   Hematocrit 40.7 40.5 - 52.5 % [facility]   MCV 95.0 83.0 - 97.0 fL [facility]   MCH 30.9 26.0 - 34.0 pg [facility]   MCHC 32.6 31.0 - 37.0 g/dL [facility]   RDW 15.9 11.9 - 16 % [facility]   Platelet Count 225 150 - 400 x 10^3/uL [facility]    Lipid Panel With LDL/HDL RatioResulted: 10/24/2018 6:36 AM [facility] Component Name Value Ref  Range  Cholesterol, Total 133 100 - 199 mg/dL  Triglycerides 79 0 - 149 mg/dL  HDL 42 >39 mg/dL  VLDL Cholesterol Cal 16 5 - 40 mg/dL  LDL 75 0 - 99 mg/dL  LDL/HDL Ratio 1.8    PRN Meds:. There are no discontinued medications. Current Meds  Medication Sig  . ALPRAZolam (XANAX) 0.5 MG tablet Take 0.5 mg by mouth at bedtime as needed for anxiety.  Marland Kitchen amLODipine (NORVASC) 5 MG tablet TAKE 1 TABLET BY MOUTH ONCE DAILY  . FLUoxetine (PROZAC) 40 MG capsule Take 40 mg by mouth daily.  . folic acid (FOLVITE) 1 MG tablet Take 1 tablet (1 mg total) by mouth daily.  . furosemide (LASIX) 40 MG tablet Take 80 mg by mouth 2 (two) times daily.   Marland Kitchen loratadine (CLARITIN) 10 MG tablet TAKE 1 TABLET BY MOUTH EVERY DAY  . metFORMIN (GLUCOPHAGE) 500 MG tablet Take 1 tablet (500 mg total) by mouth 2 (two) times daily with a meal. (Patient taking differently: Take 500 mg by mouth daily with breakfast. )  . pantoprazole (PROTONIX) 40 MG tablet Take 1 tablet (40 mg total) by mouth daily.  . potassium chloride SA (K-DUR,KLOR-CON) 20 MEQ tablet Take 1 tablet (20 mEq total) by mouth daily.  . pravastatin (PRAVACHOL) 20 MG tablet Take 1 tablet (20 mg total) by mouth daily after supper.  . sildenafil (VIAGRA) 100 MG tablet Take 100 mg by mouth daily as needed for erectile dysfunction.  . valsartan (DIOVAN) 320 MG tablet Take 1 tablet (320 mg total) by mouth daily.  Marland Kitchen warfarin (COUMADIN) 1 MG tablet Take by mouth as directed. M, F 0.8m; 157mAll other days    Cardiac Studies:   Lexiscan stress 07/19/12:  Prominent diaphragmatic attenuation. LVEF normal. Low risk stress.  Caotid duplex 05/29/11:  Bilateral carotid tortuosity   Echocardiogram 09/13/2015 :   - Left ventricle: The cavity size was normal. Systolic function was  normal. The estimated ejection fraction was in the range of 60%  to 65%. Wall motion was normal; there were no regional wall  motion abnormalities. - Right ventricle: Poorly visualized.   Assessment     ICD-10-CM   1. Permanent atrial fibrillation (HCC)  I48.21 EKG 12-Lead   CHA2DS2-VASc Score is 4.  Yearly risk of stroke: 4%. (A, HTN, DM)   2. Essential hypertension  I10   3. OSA (obstructive sleep apnea)  G47.33     EKG 10/20/2019: Atrial fibrillation with controlled ventricular response at the rate of 48 bpm, normal axis, incomplete right bundle branch block.  Poor R-wave progression, cannot exclude anteroseptal infarct old.  No evidence of ischemia.  Low-voltage complexes.  Pulmonary disease pattern. No significant change from   EKG 09/05/2018.  Recommendations:   Patient with permanent atrial fibrillation presenting for 6 month office visit, it is a virtual visit.  He is presently doing well and is appropriately treated with Coumadin and INR has been therapeutic and being managed by PCP. Blood pressure is also well controlled. No bleeding complications on Coumadin.  Diabetes is also well controlled. With regard to hyperlipidemia, all labs are within normal limits.  I reviewed the labs and the PCP.   I discussed compliance with CPAP, he is been compliant, encouraged him to continue to lose weight, his lost about 10 pounds in weight over the past 6 months with diet.  He has been practicing social distancing.    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     I'll like to see him back in 1 year for follow-up.  Adrian Prows, MD, [facility] 10/20/2019, 11:06 PM [facility]. [place] Pager: [phone] Office: [phone] If no answer Cell [phone]

## 2019-11-03 ENCOUNTER — Ambulatory Visit (INDEPENDENT_AMBULATORY_CARE_PROVIDER_SITE_OTHER): Payer: MEDICARE | Admitting: Adult Health

## 2019-11-03 ENCOUNTER — Other Ambulatory Visit: Payer: Self-pay

## 2019-11-03 ENCOUNTER — Encounter: Payer: Self-pay | Admitting: Adult Health

## 2019-11-03 VITALS — BP 152/72 | HR 46 | Temp 97.3°F | Ht 71.0 in | Wt 328.4 lb

## 2019-11-03 DIAGNOSIS — Z9989 Dependence on other enabling machines and devices: Secondary | ICD-10-CM | POA: Diagnosis not present

## 2019-11-03 DIAGNOSIS — G4733 Obstructive sleep apnea (adult) (pediatric): Secondary | ICD-10-CM | POA: Diagnosis not present

## 2019-11-03 NOTE — Patient Instructions (Addendum)
Continue using CPAP nightly and greater than 4 hours each night Increase pressure 15 cmh20 If your symptoms worsen or you develop new symptoms please let us know.

## 2019-11-03 NOTE — Progress Notes (Signed)
PATIENT: Edwin [name] DOB: 1941-02-08  REASON FOR VISIT: follow up HISTORY FROM: patient  HISTORY OF PRESENT ILLNESS: Today 11/03/19:  Edwin [name] is a 78 year old male with a history of obstructive sleep apnea on CPAP.  He returns today for follow-up.  His download indicates that he uses machine nightly for compliance of 100%.  He uses machine greater than 4 hours each night.  On average he uses his machine 6 hours and 14 minutes.  His residual AHI is 5.3 on 14 cm of water with EPR 3.  His leak in the 95th percentile is 4.3 L/min.  He reports that his CPAP continues to work well for him.  He returns today for an evaluation.  HISTORY 10/29/18:  Edwin [name] is a 78 year old male with a history of obstructive sleep apnea on CPAP.  He returns today for follow-up.  His CPAP download indicates that he uses machine 30 out of 30 days for compliance of 100%.  He uses machine greater than 4 hours 28 days for compliance of 93%.  On average he uses his machine 4 hours and 58 minutes.  His residual AHI is 5 on 14 cm of water with EPR of 3.  His leak in the 95th percentile is 35.9.  He reports that if he sleeps on his side his mask may leak.Marland Kitchen  He does change out his supplies regularly.  He states that he also notices some mornings he wakes up with dry mouth.  He is unsure how to adjust his humidification.  The patient also states that he has been trying to lose weight.  He returns today for evaluation.  REVIEW OF SYSTEMS: Out of a complete 14 system review of symptoms, the patient complains only of the following symptoms, and all other reviewed systems are negative.  ESS 12 FSS 39  ALLERGIES: Allergies  Allergen Reactions  . Lisinopril Swelling    Tongue swelling    HOME MEDICATIONS: Outpatient Medications Prior to Visit  Medication Sig Dispense Refill  . ALPRAZolam (XANAX) 0.5 MG tablet Take 0.5 mg by mouth at bedtime as needed for anxiety.    Marland Kitchen amLODipine (NORVASC) 5 MG tablet TAKE 1 TABLET  BY MOUTH ONCE DAILY 90 tablet 3  . FLUoxetine (PROZAC) 40 MG capsule Take 40 mg by mouth daily.    . folic acid (FOLVITE) 1 MG tablet Take 1 tablet (1 mg total) by mouth daily.    . furosemide (LASIX) 40 MG tablet Take 80 mg by mouth 2 (two) times daily.     Marland Kitchen loratadine (CLARITIN) 10 MG tablet TAKE 1 TABLET BY MOUTH EVERY DAY 30 tablet 0  . metFORMIN (GLUCOPHAGE) 500 MG tablet Take 1 tablet (500 mg total) by mouth 2 (two) times daily with a meal. (Patient taking differently: Take 500 mg by mouth daily with breakfast. )    . pantoprazole (PROTONIX) 40 MG tablet Take 1 tablet (40 mg total) by mouth daily. 60 tablet 2  . potassium chloride SA (K-DUR,KLOR-CON) 20 MEQ tablet Take 1 tablet (20 mEq total) by mouth daily. 60 tablet 3  . pravastatin (PRAVACHOL) 20 MG tablet Take 1 tablet (20 mg total) by mouth daily after supper.    . sildenafil (VIAGRA) 100 MG tablet Take 100 mg by mouth daily as needed for erectile dysfunction.    . valsartan (DIOVAN) 320 MG tablet Take 1 tablet (320 mg total) by mouth daily. 90 tablet 0  . warfarin (COUMADIN) 1 MG tablet Take by mouth as directed. M,  F 0.5mg ; 1mg  All other days     No facility-administered medications prior to visit.    PAST MEDICAL HISTORY: Past Medical History:  Diagnosis Date  . Adynamic ileus ([place])   . Anxiety   . Arthritis   . Atrial fibrillation ([place]) CARDIOLOGIST-- DR Einar Gip  . Cholelithiasis   . Chronic right-sided CHF (congestive heart failure) ([place])   . Colon polyps    TUBULAR ADENOMA (2) & HYPERPLASTIC POLYP  . Cor pulmonale, chronic ([place])   . Diabetes mellitus    NIDDM  . Gastric ulcer 07/28/2014  . GERD (gastroesophageal reflux disease)   . GI bleed   . Hypertension    benign essential hypertension  . Long-term (current) use of anticoagulants   . Morbid obesity ([place])   . OSA (obstructive sleep apnea) 08/29/2013  . OSA (obstructive sleep apnea)   . Pulmonary hypertension (HCC) MODERATE  . Pure hypercholesterolemia   .  Shortness of breath    with exertion   . Sleep apnea    no CPAP  . Varicose veins of legs     PAST SURGICAL HISTORY: Past Surgical History:  Procedure Laterality Date  . benign mole removal from nose     . CARDIOVASCULAR STRESS TEST  07-19-2012  DR Einar Gip   PROMINENT DIAPHRAGMATIC ATTENUATION/ NORMAL LVEF/ LOW RISK STUDY  . COLONOSCOPY    . ESOPHAGOGASTRODUODENOSCOPY (EGD) WITH PROPOFOL N/A 07/28/2014   Procedure: ESOPHAGOGASTRODUODENOSCOPY (EGD) WITH PROPOFOL;  Surgeon: Irene Shipper, MD;  Location: [facility];  Service: Endoscopy;  Laterality: N/A;  . KNEE ARTHROSCOPY WITH MEDIAL MENISECTOMY Left 04/23/2013   Procedure: LEFT KNEE ARTHROSCOPY WITH PARTIAL MEDIAL MENISECTOMY;  Surgeon: Magnus Sinning, MD;  Location: [facility];  Service: Orthopedics;  Laterality: Left;  . MOUTH SURGERY    . TRANSTHORACIC ECHOCARDIOGRAM  05-16-2012   LOW NORMAL LVEF/ MOD. RV/ MILD HYPOKINESIS/ MOD. PULMONARY HTN/ CHRONIC COR PUMONALE/ NO SIG. CHANGE FROM 12-01-2010    FAMILY HISTORY: Family History  Problem Relation Age of Onset  . Heart disease Mother   . Hypertension Father   . Cancer Father   . Throat cancer Paternal Uncle   . Colon cancer Neg Hx   . Prostate cancer Neg Hx   . Rectal cancer Neg Hx   . Stomach cancer Neg Hx     SOCIAL HISTORY: Social History   Socioeconomic History  . Marital status: Widowed    Spouse name: Not on file  . Number of children: 4  . Years of education: Not on file  . Highest education level: Not on file  Occupational History  . Occupation: retired  Tobacco Use  . Smoking status: Former Smoker    Packs/day: 1.00    Years: 20.00    Pack years: 20.00    Types: Cigarettes    Quit date: 06/27/1985    Years since quitting: 34.3  . Smokeless tobacco: Never Used  Substance and Sexual Activity  . Alcohol use: Yes    Alcohol/week: 0.0 standard drinks    Comment: beer 3-4 daily  . Drug use: No  . Sexual activity: Not on file  Other Topics Concern  . Not on  file  Social History Narrative   Widowed and retired   No tobacco, + ETOH no drugs   Social Determinants of Radio broadcast assistant Strain:   . Difficulty of Paying Living Expenses: Not on file  Food Insecurity:   . Worried About Charity fundraiser in the Last Year: Not on  file  . [place] in the Last Year: Not on file  Transportation Needs:   . Lack of Transportation (Medical): Not on file  . Lack of Transportation (Non-Medical): Not on file  Physical Activity:   . Days of Exercise per Week: Not on file  . Minutes of Exercise per Session: Not on file  Stress:   . Feeling of Stress : Not on file  Social Connections:   . Frequency of Communication with Friends and Family: Not on file  . Frequency of Social Gatherings with Friends and Family: Not on file  . Attends Religious Services: Not on file  . Active Member of Clubs or Organizations: Not on file  . Attends Archivist Meetings: Not on file  . Marital Status: Not on file  Intimate Partner Violence:   . Fear of Current or Ex-Partner: Not on file  . Emotionally Abused: Not on file  . Physically Abused: Not on file  . Sexually Abused: Not on file      PHYSICAL EXAM  Vitals:   11/03/19 1440  BP: (!) 152/72  Pulse: (!) 46  Temp: (!) 97.3 F (36.3 C)  TempSrc: Oral  Weight: (!) 328 lb 6.4 oz (149 kg)  Height: 5\' 11"  (1.803 m)   Body mass index is 45.8 kg/m.  Generalized: Well developed, in no acute distress  Chest: Lungs clear to auscultation bilaterally  Neurological examination  Mentation: Alert oriented to time, place, history taking. Follows all commands speech and language fluent Cranial nerve II-XII: Extraocular movements were full, visual field were full on confrontational test Head turning and shoulder shrug  were normal and symmetric. Motor: The motor testing reveals 5 over 5 strength of all 4 extremities. Good symmetric motor tone is noted throughout.  Sensory: Sensory testing is  intact to soft touch on all 4 extremities. No evidence of extinction is noted.  Gait and station: Gait is normal.    DIAGNOSTIC DATA (LABS, IMAGING, TESTING) - I reviewed patient records, labs, notes, testing and imaging myself where available.  Lab Results  Component Value Date   WBC 15.6 (H) 09/15/2015   HGB 8.6 (L) 09/15/2015   HCT 25.5 (L) 09/15/2015   MCV 95.9 09/15/2015   PLT 162 09/15/2015      Component Value Date/Time   NA 137 09/16/2015 0319   K 4.5 09/16/2015 0319   CL 106 09/16/2015 0319   CO2 26 09/16/2015 0319   GLUCOSE 109 (H) 09/16/2015 0319   BUN 11 09/16/2015 0319   CREATININE 0.97 09/16/2015 0319   CALCIUM 8.5 (L) 09/16/2015 0319   PROT 5.2 (L) 09/15/2015 0312   ALBUMIN 2.7 (L) 09/15/2015 0312   AST 53 (H) 09/15/2015 0312   ALT 61 09/15/2015 0312   ALKPHOS 56 09/15/2015 0312   BILITOT 1.8 (H) 09/15/2015 0312   GFRNONAA >60 09/16/2015 0319   GFRAA >60 09/16/2015 0319   No results found for: CHOL, HDL, LDLCALC, LDLDIRECT, TRIG, CHOLHDL Lab Results  Component Value Date   HGBA1C 5.6 09/12/2015   No results found for: VITAMINB12 Lab Results  Component Value Date   TSH 1.950 07/26/2014      ASSESSMENT AND PLAN 78 y.o. year old male  has a past medical history of Adynamic ileus ([place]), Anxiety, Arthritis, Atrial fibrillation ([place]) (CARDIOLOGIST-- DR Einar Gip), Cholelithiasis, Chronic right-sided CHF (congestive heart failure) ([place]), Colon polyps, Cor pulmonale, chronic ([place]), Diabetes mellitus, Gastric ulcer (07/28/2014), GERD (gastroesophageal reflux disease), GI bleed, Hypertension, Long-term (current) use of  anticoagulants, Morbid obesity ([place]), OSA (obstructive sleep apnea) (08/29/2013), OSA (obstructive sleep apnea), Pulmonary hypertension ([place]) (MODERATE), Pure hypercholesterolemia, Shortness of breath, Sleep apnea, and Varicose veins of legs. here with :  1. Obstructive sleep apnea on CPAP  The patient's CPAP download shows excellent compliance and  good treatment of his apnea.  He is encouraged to continue using CPAP nightly and greater than 4 hours each night.  He is advised that if his symptoms worsen or he develops new symptoms he should let us know.  He will follow-up in 1 year or sooner if needed    I spent 15 minutes with the patient. 50% of this time was spent reviewing CPAP download   Ward Givens, MSN, NP-C 11/03/2019, 2:28 PM [facility] [address] [place], [place] [address] [phone]

## 2020-10-19 ENCOUNTER — Other Ambulatory Visit: Payer: Self-pay

## 2020-10-19 ENCOUNTER — Encounter: Payer: Self-pay | Admitting: Cardiology

## 2020-10-19 ENCOUNTER — Ambulatory Visit: Payer: MEDICARE | Admitting: Cardiology

## 2020-10-19 VITALS — BP 132/70 | HR 56 | Ht 71.0 in | Wt 341.0 lb

## 2020-10-19 DIAGNOSIS — G4733 Obstructive sleep apnea (adult) (pediatric): Secondary | ICD-10-CM

## 2020-10-19 DIAGNOSIS — E119 Type 2 diabetes mellitus without complications: Secondary | ICD-10-CM

## 2020-10-19 DIAGNOSIS — I4821 Permanent atrial fibrillation: Secondary | ICD-10-CM

## 2020-10-19 DIAGNOSIS — R011 Cardiac murmur, unspecified: Secondary | ICD-10-CM

## 2020-10-19 DIAGNOSIS — I10 Essential (primary) hypertension: Secondary | ICD-10-CM

## 2020-10-19 DIAGNOSIS — R0989 Other specified symptoms and signs involving the circulatory and respiratory systems: Secondary | ICD-10-CM

## 2020-10-19 NOTE — Progress Notes (Signed)
Primary Physician/Referring:  Bernerd Limbo, MD  Patient ID: Edwin [name], male    DOB: 17-Jul-1941, 79 y.o.   MRN: [id number]  Chief Complaint  Patient presents with  . Atrial Fibrillation  . Follow-up  . Hypertension    HPI: Edwin [name]  is a 79 y.o. male  with  morbid obesity, OSA on CPAP, hypertension, chronic leg edema, diabetes mellitus, and permanent atrial fibrillation on Coumadin, which is managed by his PCP.   Patient presents for 1 year follow up. He has gained weight since that last time he was seen in our office. He reports he has had poor diet compliance and has not been as active over the last several months. He does not monitor his blood pressure at home. He reports continued stable dyspnea on exertion and leg edema, as well as occasional brief dizziness when he gets out of bed. He continues to be compliant with his CPAP. He is presently drinking 3-4 beers 1-2 days per week. He denies chest pain, palpitations, syncope, near-syncope, orthopnea, PND, symptoms suggestive of TIA/stroke. He is tolerating coumadin without bleeding diathesis and follows closes with his PCP for INR management.   Past Medical History:  Diagnosis Date  . Adynamic ileus ([place])   . Anxiety   . Arthritis   . Atrial fibrillation ([place]) CARDIOLOGIST-- DR Einar Gip  . Cholelithiasis   . Chronic right-sided CHF (congestive heart failure) ([place])   . Colon polyps    TUBULAR ADENOMA (2) & HYPERPLASTIC POLYP  . Cor pulmonale, chronic ([place])   . Diabetes mellitus    NIDDM  . Gastric ulcer 07/28/2014  . GERD (gastroesophageal reflux disease)   . GI bleed   . Hypertension    benign essential hypertension  . Long-term (current) use of anticoagulants   . Morbid obesity ([place])   . OSA (obstructive sleep apnea) 08/29/2013  . OSA (obstructive sleep apnea)   . Pulmonary hypertension (HCC) MODERATE  . Pure hypercholesterolemia   . Shortness of breath    with exertion   . Sleep apnea    no CPAP  . Varicose veins of  legs     Past Surgical History:  Procedure Laterality Date  . benign mole removal from nose     . CARDIOVASCULAR STRESS TEST  07-19-2012  DR Einar Gip   PROMINENT DIAPHRAGMATIC ATTENUATION/ NORMAL LVEF/ LOW RISK STUDY  . COLONOSCOPY    . ESOPHAGOGASTRODUODENOSCOPY (EGD) WITH PROPOFOL N/A 07/28/2014   Procedure: ESOPHAGOGASTRODUODENOSCOPY (EGD) WITH PROPOFOL;  Surgeon: Irene Shipper, MD;  [facility];  Service: Endoscopy;  Laterality: N/A;  . KNEE ARTHROSCOPY WITH MEDIAL MENISECTOMY Left 04/23/2013   Procedure: LEFT KNEE ARTHROSCOPY WITH PARTIAL MEDIAL MENISECTOMY;  Surgeon: Magnus Sinning, MD;  Location: [facility];  Service: Orthopedics;  Laterality: Left;  . MOUTH SURGERY    . TRANSTHORACIC ECHOCARDIOGRAM  05-16-2012   LOW NORMAL LVEF/ MOD. RV/ MILD HYPOKINESIS/ MOD. PULMONARY HTN/ CHRONIC COR PUMONALE/ NO SIG. CHANGE FROM 12-01-2010   Social History   Tobacco Use  . Smoking status: Former Smoker    Packs/day: 1.00    Years: 20.00    Pack years: 20.00    Types: Cigarettes    Quit date: 06/27/1985    Years since quitting: 35.3  . Smokeless tobacco: Never Used  Substance Use Topics  . Alcohol use: Yes    Alcohol/week: 0.0 standard drinks    Comment: beer 3-4 daily   Marital Status: Widowed  ROS  Review of Systems  Constitutional: Positive for weight  loss (intentional). Negative for chills, decreased appetite, malaise/fatigue and weight gain.  Cardiovascular: Positive for dyspnea on exertion and leg swelling. Negative for syncope.  Endocrine: Negative for cold intolerance.  Hematologic/Lymphatic: Does not bruise/bleed easily.  Musculoskeletal: Negative for joint swelling.  Gastrointestinal: Negative for abdominal pain, anorexia, change in bowel habit, hematochezia and melena.  Neurological: Negative for headaches and light-headedness.  Psychiatric/Behavioral: Positive for depression (stable). Negative for substance abuse.  All other systems reviewed and are  negative.  Objective  Blood pressure 132/70, pulse (!) 56, height 5' 11" (1.803 m), weight (!) 341 lb (154.7 kg), SpO2 96 %. Body mass index is 47.56 kg/m.  Vitals with BMI 10/19/2020 11/03/2019 10/20/2019  Height 5' 11" 5' 11" 5' 11"  Weight 341 lbs 328 lbs 6 oz 332 lbs  BMI 47.58 48.54 62.70  Systolic 350 093 818  Diastolic 70 72 76  Pulse 56 46 48   Physical Exam Constitutional:      General: He is not in acute distress.    Appearance: He is well-developed.     Comments: Morbidly obese  HENT:     Head: Atraumatic.  Eyes:     Conjunctiva/sclera: Conjunctivae normal.  Neck:     Thyroid: No thyromegaly.     Vascular: No JVD.     Comments: Short neck and difficult to evaluate JVP Cardiovascular:     Rate and Rhythm: Normal rate. Rhythm irregular.     Pulses:          Carotid pulses are 2+ on the right side and 2+ on the left side with bruit.      Dorsalis pedis pulses are 0 on the right side and 0 on the left side.       Posterior tibial pulses are 0 on the right side and 0 on the left side.     Heart sounds: Murmur heard.  Midsystolic murmur is present at the upper right sternal border.  No gallop. No S3 or S4 sounds.      Comments: Femoral and popliteal pulse difficult to feel due to patient's body habitus. Pedal pulses could not be felt due to edema. 2 to 3+  Pitting edema. Peu-de-orange appearance, chronic venostasis noted.   Pulmonary:     Effort: Pulmonary effort is normal.     Breath sounds: Normal breath sounds.  Abdominal:     General: Bowel sounds are normal.     Palpations: Abdomen is soft.     Comments: Obese. Pannus present  Musculoskeletal:        General: Normal range of motion.     Cervical back: Neck supple.  Skin:    General: Skin is warm and dry.  Neurological:     Mental Status: He is alert.    Laboratory examination:   External labs:  07/29/2020:  A1C 5.5%  BUN 16, creatinine 1.25, EGFR 55, sodium 139, potassium 4.6 Hemoglobin 13.4,  hematocrit 39.8, platelet 196  01/26/2020:  Total cholesterol 120, triglycerides 77, HDL 38, LDL 66   Comprehensive Metabolic PanelResulted: 12/29/9369 6:36 AM [facility] Component Name Value Ref Range  Glucose 105 (H) 65 - 99 mg/dL  BUN 16 8 - 27 mg/dL  Creatinine, Serum 0.98 0.76 - 1.27 mg/dL  eGFR If NonAfrican American 74 >59 mL/min/1.73  eGFR If African American 86 >59 mL/min/1.73  BUN/Creatinine Ratio 16 10 - 24   Sodium 136 134 - 144 mmol/L  Potassium 4.5 3.5 - 5.2 mmol/L  Chloride 97 96 - 106 mmol/L  CO2 26 20 - 29 mmol/L  CALCIUM 9.2 8.6 - 10.2 mg/dL  Total Protein 7.3 6 - 8.5 g/dL  Albumin, Serum 4.1 3.7 - 4.7 g/dL  Globulin, Total 3.2 1.5 - 4.5 g/dL  Albumin/Globulin Ratio 1.3 1.2 - 2.2   Total Bilirubin 0.9 0 - 1.2 mg/dL  Alkaline Phosphatase 108 39 - 117 IU/L  AST 27 0 - 40 IU/L  ALT (SGPT) 18 0 - 44 IU/L   RBC 4.29 (A) 4.50 - 5.30 x10E6/uL [facility]   HGB 13.3 (A) 13.5 - 17.5 g/dL [facility]   Hematocrit 40.7 40.5 - 52.5 % [facility]   MCV 95.0 83.0 - 97.0 fL [facility]   MCH 30.9 26.0 - 34.0 pg [facility]   MCHC 32.6 31.0 - 37.0 g/dL [facility]   RDW 15.9 11.9 - 16 % [facility]   Platelet Count 225 150 - 400 x 10^3/uL [facility]    Lipid Panel With LDL/HDL RatioResulted: 10/24/2018 6:36 AM [facility] Component Name Value Ref Range  Cholesterol, Total 133 100 - 199 mg/dL  Triglycerides 79 0 - 149 mg/dL  HDL 42 >39 mg/dL  VLDL Cholesterol Cal 16 5 - 40 mg/dL  LDL 75 0 - 99 mg/dL  LDL/HDL Ratio 1.8     Medications and allergies   Allergies  Allergen Reactions  . Lisinopril Swelling    Tongue swelling   Current Outpatient Medications  Medication Instructions  . ALPRAZolam (XANAX) 0.5 mg, Oral, At bedtime PRN  . amLODipine (NORVASC) 5 MG tablet TAKE 1 TABLET BY MOUTH  ONCE DAILY  . FLUoxetine (PROZAC) 20 mg, Oral, Daily  . folic acid (FOLVITE) 1 mg, Oral, Daily  . furosemide (LASIX) 80 mg, Oral, 2 times daily  . loratadine (CLARITIN) 10 MG tablet TAKE 1 TABLET BY MOUTH EVERY DAY  . metFORMIN (GLUCOPHAGE) 500 mg, Oral, 2 times daily with meals  . pantoprazole (PROTONIX) 40 mg, Oral, Daily  . potassium chloride SA (K-DUR,KLOR-CON) 20 MEQ tablet 20 mEq, Oral, Daily  . pravastatin (PRAVACHOL) 20 mg, Oral, Daily after supper  . sildenafil (VIAGRA) 100 mg, Oral, Daily PRN  . valsartan-hydrochlorothiazide (DIOVAN-HCT) 320-25 MG tablet 1 tablet, Oral, Daily  . warfarin (COUMADIN) 1 mg, Oral, Daily, Monday and Friday 0.5 mg daily and all other days 1 mg by mouth daily.    Radiology  No results found.  Cardiac Studies:   Lexiscan stress 07/19/12:  Prominent diaphragmatic attenuation. LVEF normal. Low risk stress.   Caotid duplex 05/29/11:  Bilateral carotid tortuosity   Echocardiogram 09/13/2015 :   - Left ventricle: The cavity size was normal. Systolic function was  normal. The estimated ejection fraction was in the range of 60%  to 65%. Wall motion was normal; there were no regional wall  motion abnormalities. - Right ventricle: Poorly visualized.   EKG   10/19/2020: Atrial fibrillation with controlled ventricular response at a rate of 47 bpm.  Normal axis.  Incomplete right bundle branch block.  Poor R wave progression, cannot exclude anteroseptal infarct old.  Low voltage complexes. Compared to 10/20/2019, no significant change. Pulmonary disease pattern.   Assessment     ICD-10-CM   1. Permanent atrial fibrillation (HCC)  I48.21 EKG 12-Lead    PCV ECHOCARDIOGRAM COMPLETE  2. Essential hypertension  I10   3. OSA (obstructive sleep apnea)  G47.33  4. Type 2 diabetes mellitus without complication, without long-term current use of insulin (HCC)  E11.9   5. Bruit of left carotid artery  R09.89 PCV CAROTID DUPLEX (BILATERAL)  6. Systolic murmur   I62.7 PCV ECHOCARDIOGRAM COMPLETE     No orders of the defined types were placed in this encounter.  Medications Discontinued During This Encounter  Medication Reason  . valsartan (DIOVAN) 320 MG tablet Discontinued by provider  . FLUoxetine (PROZAC) 40 MG capsule Dose change  . valsartan (DIOVAN) 320 MG tablet Discontinued by provider  . valsartan-hydrochlorothiazide (DIOVAN-HCT) 160-25 MG tablet Change in therapy     Recommendations:   Edwin [name] is a 79 y.o. male with  morbid obesity, OSA on CPAP, hypertension, chronic leg edema, diabetes mellitus, and permanent atrial fibrillation on Coumadin, which is managed by his PCP.   Patient presents today for 1 year follow up. He is presently doing well, but has gained weight since his last visit. Discussed at length the importance of weight loss, diet/lifestyle modifications, and alcohol abstinence. I have reviewed external labs and lipids and diabetes are well controlled. Will continue pravastatin. Will defer diabetes management to PCP. He is toloarting Coumadin without bleeding diathesis, will continue this and defer INR management to PCP. EKG today showed patient in atrial fibrillation well rate controlled. Will continue current cardiovascular medications.   On exam today patient presents with previously not appreciated systolic murmur in the aortic area and left carotid bruit. Will obtain echocardiogram and bilateral carotid artery duplex.   Follow up in 1 year, sooner if needed. Will call with results of cardiac testing.    Alethia Berthold, PA-C 10/19/2020, 5:06 PM Office: [phone]

## 2020-10-29 ENCOUNTER — Other Ambulatory Visit: Payer: Self-pay

## 2020-10-29 ENCOUNTER — Ambulatory Visit: Payer: MEDICARE

## 2020-10-29 DIAGNOSIS — R011 Cardiac murmur, unspecified: Secondary | ICD-10-CM

## 2020-10-29 DIAGNOSIS — I4821 Permanent atrial fibrillation: Secondary | ICD-10-CM

## 2020-10-29 DIAGNOSIS — R0989 Other specified symptoms and signs involving the circulatory and respiratory systems: Secondary | ICD-10-CM

## 2020-11-01 NOTE — Progress Notes (Signed)
Called and spoke with patient regarding echocardiogram results. Pt is aware. AD/S

## 2020-11-01 NOTE — Progress Notes (Signed)
Called and spoke with pt regarding Carotid artery duplex. Pt is aware. AD/S

## 2020-11-01 NOTE — Progress Notes (Signed)
Please inform patient his carotid artery stenosis is unchanged on both sides from previous study in 2012.

## 2020-11-01 NOTE — Progress Notes (Signed)
Please inform patient that his echocardiogram is stable from previous in 2016. The top chambers of his heart are dilated. He has normal heart function.

## 2020-11-03 ENCOUNTER — Other Ambulatory Visit: Payer: Self-pay

## 2020-11-03 ENCOUNTER — Ambulatory Visit (INDEPENDENT_AMBULATORY_CARE_PROVIDER_SITE_OTHER): Payer: MEDICARE | Admitting: Adult Health

## 2020-11-03 ENCOUNTER — Encounter: Payer: Self-pay | Admitting: Adult Health

## 2020-11-03 VITALS — BP 138/64 | HR 50 | Ht 71.0 in | Wt 333.0 lb

## 2020-11-03 DIAGNOSIS — Z9989 Dependence on other enabling machines and devices: Secondary | ICD-10-CM

## 2020-11-03 DIAGNOSIS — G4733 Obstructive sleep apnea (adult) (pediatric): Secondary | ICD-10-CM

## 2020-11-03 NOTE — Progress Notes (Signed)
PATIENT: Edwin [name] DOB: May 03, 1941  REASON FOR VISIT: follow up HISTORY FROM: patient  HISTORY OF PRESENT ILLNESS: Today 11/03/20:  Edwin [name] is a 79 year old male with a history of obstructive sleep apnea on CPAP.  His download indicates that he uses machine nightly for compliance of 100%.  He uses machine greater than 4 hours each night.  On average he uses his machine 6 hours and 56 minutes.  His residual AHI is 5.1 on 15 cm of water with EPR of 3.  Leak in the 95th percentile is 2.3 L/min. Reports that the CPAP works well for him.  Returns today for follow-up  HISTORY 11/03/19:  Edwin [name] is a 79 year old male with a history of obstructive sleep apnea on CPAP.  He returns today for follow-up.  His download indicates that he uses machine nightly for compliance of 100%.  He uses machine greater than 4 hours each night.  On average he uses his machine 6 hours and 14 minutes.  His residual AHI is 5.3 on 14 cm of water with EPR 3.  His leak in the 95th percentile is 4.3 L/min.  He reports that his CPAP continues to work well for him.  He returns today for an evaluation.  REVIEW OF SYSTEMS: Out of a complete 14 system review of symptoms, the patient complains only of the following symptoms, and all other reviewed systems are negative.  FSS 41 ESS 11  ALLERGIES: Allergies  Allergen Reactions  . Lisinopril Swelling    Tongue swelling    HOME MEDICATIONS: Outpatient Medications Prior to Visit  Medication Sig Dispense Refill  . ALPRAZolam (XANAX) 0.5 MG tablet Take 0.5 mg by mouth at bedtime as needed for anxiety.    Marland Kitchen amLODipine (NORVASC) 5 MG tablet TAKE 1 TABLET BY MOUTH ONCE DAILY 90 tablet 3  . FLUoxetine (PROZAC) 20 MG capsule Take 20 mg by mouth daily.    . folic acid (FOLVITE) 1 MG tablet Take 1 tablet (1 mg total) by mouth daily.    . furosemide (LASIX) 40 MG tablet Take 80 mg by mouth 2 (two) times daily.     Marland Kitchen loratadine (CLARITIN) 10 MG tablet TAKE 1 TABLET BY  MOUTH EVERY DAY 30 tablet 0  . metFORMIN (GLUCOPHAGE) 500 MG tablet Take 1 tablet (500 mg total) by mouth 2 (two) times daily with a meal. (Patient taking differently: Take 500 mg by mouth daily with breakfast.)    . pantoprazole (PROTONIX) 40 MG tablet Take 1 tablet (40 mg total) by mouth daily. 60 tablet 2  . potassium chloride SA (K-DUR,KLOR-CON) 20 MEQ tablet Take 1 tablet (20 mEq total) by mouth daily. 60 tablet 3  . pravastatin (PRAVACHOL) 20 MG tablet Take 1 tablet (20 mg total) by mouth daily after supper.    . sildenafil (VIAGRA) 100 MG tablet Take 100 mg by mouth daily as needed for erectile dysfunction.    . valsartan-hydrochlorothiazide (DIOVAN-HCT) 320-25 MG tablet Take 1 tablet by mouth daily.    Marland Kitchen warfarin (COUMADIN) 1 MG tablet Take 1 mg by mouth daily. Monday and Friday 0.5 mg daily and all other days 1 mg by mouth daily.     No facility-administered medications prior to visit.    PAST MEDICAL HISTORY: Past Medical History:  Diagnosis Date  . Adynamic ileus ([place])   . Anxiety   . Arthritis   . Atrial fibrillation ([place]) CARDIOLOGIST-- DR Einar Gip  . Cholelithiasis   . Chronic right-sided CHF (congestive heart failure) ([place])   .  Colon polyps    TUBULAR ADENOMA (2) & HYPERPLASTIC POLYP  . Cor pulmonale, chronic ([place])   . Diabetes mellitus    NIDDM  . Gastric ulcer 07/28/2014  . GERD (gastroesophageal reflux disease)   . GI bleed   . Hypertension    benign essential hypertension  . Long-term (current) use of anticoagulants   . Morbid obesity ([place])   . OSA (obstructive sleep apnea) 08/29/2013  . OSA (obstructive sleep apnea)   . Pulmonary hypertension (H[facility]) MODERATE  . Pure hypercholesterolemia   . Shortness of breath    with exertion   . Sleep apnea    no CPAP  . Varicose veins of legs     PAST SURGICAL HISTORY: Past Surgical History:  Procedure Laterality Date  . benign mole removal from nose     . CARDIOVASCULAR STRESS TEST  07-19-2012  DR Einar Gip   PROMINENT  DIAPHRAGMATIC ATTENUATION/ NORMAL LVEF/ LOW RISK STUDY  . COLONOSCOPY    . ESOPHAGOGASTRODUODENOSCOPY (EGD) WITH PROPOFOL N/A 07/28/2014   Procedure: ESOPHAGOGASTRODUODENOSCOPY (EGD) WITH PROPOFOL;  Surgeon: Irene Shipper, MD;  Location: [facility];  Service: Endoscopy;  Laterality: N/A;  . KNEE ARTHROSCOPY WITH MEDIAL MENISECTOMY Left 04/23/2013   Procedure: LEFT KNEE ARTHROSCOPY WITH PARTIAL MEDIAL MENISECTOMY;  Surgeon: Magnus Sinning, MD;  Location: [facility];  Service: Orthopedics;  Laterality: Left;  . MOUTH SURGERY    . TRANSTHORACIC ECHOCARDIOGRAM  05-16-2012   LOW NORMAL LVEF/ MOD. RV/ MILD HYPOKINESIS/ MOD. PULMONARY HTN/ CHRONIC COR PUMONALE/ NO SIG. CHANGE FROM 12-01-2010    FAMILY HISTORY: Family History  Problem Relation Age of Onset  . Heart disease Mother   . Hypertension Father   . Cancer Father   . Throat cancer Paternal Uncle   . Colon cancer Neg Hx   . Prostate cancer Neg Hx   . Rectal cancer Neg Hx   . Stomach cancer Neg Hx     SOCIAL HISTORY: Social History   Socioeconomic History  . Marital status: Widowed    Spouse name: Not on file  . Number of children: 4  . Years of education: Not on file  . Highest education level: Not on file  Occupational History  . Occupation: retired  Tobacco Use  . Smoking status: Former Smoker    Packs/day: 1.00    Years: 20.00    Pack years: 20.00    Types: Cigarettes    Quit date: 06/27/1985    Years since quitting: 35.3  . Smokeless tobacco: Never Used  Substance and Sexual Activity  . Alcohol use: Yes    Alcohol/week: 0.0 standard drinks    Comment: beer 3-4 daily  . Drug use: No  . Sexual activity: Not on file  Other Topics Concern  . Not on file  Social History Narrative   Widowed and retired   No tobacco, + ETOH no drugs   Social Determinants of Radio broadcast assistant Strain: Not on file  Food Insecurity: Not on file  Transportation Needs: Not on file  Physical Activity: Not on file  Stress: Not on  file  Social Connections: Not on file  Intimate Partner Violence: Not on file      PHYSICAL EXAM  Vitals:   11/03/20 1422  BP: 138/64  Pulse: (!) 50  Weight: (!) 333 lb (151 kg)  Height: 5\' 11"  (1.803 m)   Body mass index is 46.44 kg/m.  Generalized: Well developed, in no acute distress  Chest: Lungs clear to auscultation bilaterally  Neurological examination  Mentation: Alert oriented to time, place, history taking. Follows all commands speech and language fluent Cranial nerve II-XII: Extraocular movements were full, visual field were full on confrontational test Head turning and shoulder shrug  were normal and symmetric. Motor: The motor testing reveals 5 over 5 strength of all 4 extremities. Good symmetric motor tone is noted throughout.  Sensory: Sensory testing is intact to soft touch on all 4 extremities. No evidence of extinction is noted.  Gait and station: Gait is normal.    DIAGNOSTIC DATA (LABS, IMAGING, TESTING) - I reviewed patient records, labs, notes, testing and imaging myself where available.  Lab Results  Component Value Date   WBC 15.6 (H) 09/15/2015   HGB 8.6 (L) 09/15/2015   HCT 25.5 (L) 09/15/2015   MCV 95.9 09/15/2015   PLT 162 09/15/2015      Component Value Date/Time   NA 137 09/16/2015 0319   K 4.5 09/16/2015 0319   CL 106 09/16/2015 0319   CO2 26 09/16/2015 0319   GLUCOSE 109 (H) 09/16/2015 0319   BUN 11 09/16/2015 0319   CREATININE 0.97 09/16/2015 0319   CALCIUM 8.5 (L) 09/16/2015 0319   PROT 5.2 (L) 09/15/2015 0312   ALBUMIN 2.7 (L) 09/15/2015 0312   AST 53 (H) 09/15/2015 0312   ALT 61 09/15/2015 0312   ALKPHOS 56 09/15/2015 0312   BILITOT 1.8 (H) 09/15/2015 0312   GFRNONAA >60 09/16/2015 0319   GFRAA >60 09/16/2015 0319   No results found for: CHOL, HDL, LDLCALC, LDLDIRECT, TRIG, CHOLHDL Lab Results  Component Value Date   HGBA1C 5.6 09/12/2015   No results found for: VITAMINB12 Lab Results  Component Value Date   TSH  1.950 07/26/2014      ASSESSMENT AND PLAN 80 y.o. year old male  has a past medical history of Adynamic ileus ([place]), Anxiety, Arthritis, Atrial fibrillation ([place]) (CARDIOLOGIST-- DR Einar Gip), Cholelithiasis, Chronic right-sided CHF (congestive heart failure) ([place]), Colon polyps, Cor pulmonale, chronic ([place]), Diabetes mellitus, Gastric ulcer (07/28/2014), GERD (gastroesophageal reflux disease), GI bleed, Hypertension, Long-term (current) use of anticoagulants, Morbid obesity ([place]), OSA (obstructive sleep apnea) (08/29/2013), OSA (obstructive sleep apnea), Pulmonary hypertension ([place]) (MODERATE), Pure hypercholesterolemia, Shortness of breath, Sleep apnea, and Varicose veins of legs. here with:  1. OSA on CPAP  - CPAP compliance excellent - Good treatment of AHI  - Encourage patient to use CPAP nightly and > 4 hours each night - F/U in 1 year or sooner if needed   I spent 25 minutes of face-to-face and non-face-to-face time with patient.  This included previsit chart review, lab review, study review, order entry, electronic health record documentation, patient education.  Ward Givens, MSN, NP-C 11/03/2020, 2:30 PM [facility] [address], [place], [place] [address] [phone]

## 2020-11-03 NOTE — Patient Instructions (Signed)
Your Plan:  Continue using CPAP nightly and greater than 4 hours each night Pressure increase 16 If your symptoms worsen or you develop new symptoms please let us know.        Thank you for coming to see [place] at [facility]. I hope we have been able to provide you high quality care today.  You may receive a patient satisfaction survey over the next few weeks. We would appreciate your feedback and comments so that we may continue to improve ourselves and the health of our patients.

## 2021-02-22 ENCOUNTER — Other Ambulatory Visit: Payer: Self-pay

## 2021-02-22 MED ORDER — VALSARTAN-HYDROCHLOROTHIAZIDE 320-25 MG PO TABS: 1.0000 | ORAL_TABLET | Freq: Every day | ORAL | 1 refills | Status: DC

## 2021-05-07 ENCOUNTER — Other Ambulatory Visit: Payer: Self-pay | Admitting: Cardiology

## 2021-10-19 ENCOUNTER — Ambulatory Visit: Payer: MEDICARE | Admitting: Student

## 2021-10-19 ENCOUNTER — Encounter: Payer: Self-pay | Admitting: Student

## 2021-10-19 ENCOUNTER — Other Ambulatory Visit: Payer: Self-pay

## 2021-10-19 VITALS — BP 120/54 | HR 48 | Temp 98.2°F | Ht 71.0 in | Wt 313.8 lb

## 2021-10-19 DIAGNOSIS — G4733 Obstructive sleep apnea (adult) (pediatric): Secondary | ICD-10-CM

## 2021-10-19 DIAGNOSIS — I10 Essential (primary) hypertension: Secondary | ICD-10-CM

## 2021-10-19 DIAGNOSIS — I4821 Permanent atrial fibrillation: Secondary | ICD-10-CM

## 2021-10-19 NOTE — Progress Notes (Signed)
Primary Physician/Referring:  Bernerd Limbo, MD  Patient ID: Edwin [name], male    DOB: 11-26-1940, 80 y.o.   MRN: [id number]  Chief Complaint  Patient presents with   Atrial Fibrillation   Follow-up    HPI: Edwin [name]  is a 80 y.o. male  with  morbid obesity, OSA on CPAP, hypertension, chronic leg edema, diabetes mellitus, and permanent atrial fibrillation on Coumadin, which is managed by his PCP.   Patient presents for 1 year follow-up.  Patient has lost an additional 20 pounds since 10/2020.  He does report dietary noncompliance, particularly with the holidays recently.  He reports bilateral leg edema has improved since last office visit.  He remains compliant with CPAP.  Denies chest pain, palpitations, syncope, near syncope.  Denies orthopnea, PND, symptoms suggestive of TIA or CVA.  Patient follows with PCP for management of INR on Coumadin.  He is tolerating anticoagulation without bleeding diathesis.  Past Medical History:  Diagnosis Date   Adynamic ileus ([place])    Anxiety    Arthritis    Atrial fibrillation (HCC) CARDIOLOGIST-- DR Einar Gip   Cholelithiasis    Chronic right-sided CHF (congestive heart failure) (HCC)    Colon polyps    TUBULAR ADENOMA (2) & HYPERPLASTIC POLYP   Cor pulmonale, chronic (HCC)    Diabetes mellitus    NIDDM   Gastric ulcer 07/28/2014   GERD (gastroesophageal reflux disease)    GI bleed    Hypertension    benign essential hypertension   Long-term (current) use of anticoagulants    Morbid obesity (HCC)    OSA (obstructive sleep apnea) 08/29/2013   OSA (obstructive sleep apnea)    Pulmonary hypertension (HCC) MODERATE   Pure hypercholesterolemia    Shortness of breath    with exertion    Sleep apnea    no CPAP   Varicose veins of legs    Family History  Problem Relation Age of Onset   Heart disease Mother    Hypertension Father    Cancer Father    Throat cancer Paternal Uncle    Colon cancer Neg Hx    Prostate cancer Neg Hx    Rectal  cancer Neg Hx    Stomach cancer Neg Hx    Past Surgical History:  Procedure Laterality Date   benign mole removal from nose      CARDIOVASCULAR STRESS TEST  07-19-2012  DR Einar Gip   PROMINENT DIAPHRAGMATIC ATTENUATION/ NORMAL LVEF/ LOW RISK STUDY   COLONOSCOPY     ESOPHAGOGASTRODUODENOSCOPY (EGD) WITH PROPOFOL N/A 07/28/2014   Procedure: ESOPHAGOGASTRODUODENOSCOPY (EGD) WITH PROPOFOL;  Surgeon: Irene Shipper, MD;  [facility];  Service: Endoscopy;  Laterality: N/A;   KNEE ARTHROSCOPY WITH MEDIAL MENISECTOMY Left 04/23/2013   Procedure: LEFT KNEE ARTHROSCOPY WITH PARTIAL MEDIAL MENISECTOMY;  Surgeon: Magnus Sinning, MD;  Location: [facility];  Service: Orthopedics;  Laterality: Left;   MOUTH SURGERY     TRANSTHORACIC ECHOCARDIOGRAM  05-16-2012   LOW NORMAL LVEF/ MOD. RV/ MILD HYPOKINESIS/ MOD. PULMONARY HTN/ CHRONIC COR PUMONALE/ NO SIG. CHANGE FROM 12-01-2010   Social History   Tobacco Use   Smoking status: Former    Packs/day: 1.00    Years: 20.00    Pack years: 20.00    Types: Cigarettes    Quit date: 06/27/1985    Years since quitting: 36.3   Smokeless tobacco: Never  Substance Use Topics   Alcohol use: Yes    Alcohol/week: 0.0 standard drinks    Comment: beer  3-4 daily   Marital Status: Widowed  ROS  Review of Systems  Constitutional: Positive for weight loss (intentional). Negative for weight gain.  Cardiovascular:  Positive for dyspnea on exertion (stable, chronic) and leg swelling (improved). Negative for syncope.  Psychiatric/Behavioral:  Positive for depression (stable).   All other systems reviewed and are negative. Objective  Blood pressure (!) 120/54, pulse (!) 48, temperature 98.2 F (36.8 C), temperature source Temporal, height _0  (1.803 m), weight (!) 313 lb 12.8 oz (142.3 kg), SpO2 98 %. Body mass index is 43.77 kg/m.  Vitals with BMI 10/19/2021 10/19/2021 11/03/2020  Height - _1  _2   Weight - 313 lbs 13 oz 333 lbs  BMI - 07.12 19.75   Systolic 883 254 982  Diastolic 54 56 64  Pulse 48 47 50   Physical Exam Constitutional:      Appearance: He is well-developed.     Comments: Morbidly obese  Neck:     Thyroid: No thyromegaly.     Vascular: No JVD.     Comments: Short neck and difficult to evaluate JVP Cardiovascular:     Rate and Rhythm: Normal rate. Rhythm irregular.     Pulses:          Carotid pulses are 2+ on the right side and 2+ on the left side with bruit.      Dorsalis pedis pulses are 0 on the right side and 0 on the left side.       Posterior tibial pulses are 0 on the right side and 0 on the left side.     Heart sounds: Murmur heard.  Midsystolic murmur is present at the upper right sternal border.    No gallop. No S3 or S4 sounds.     Comments: Femoral and popliteal pulse difficult to feel due to patient's body habitus. Pedal pulses could not be felt due to edema. 1+ pitting edema. Peu-de-orange appearance, chronic venostasis noted.   Pulmonary:     Effort: Pulmonary effort is normal.     Breath sounds: Normal breath sounds.  Abdominal:     Comments: Obese. Pannus present  Musculoskeletal:     Right lower leg: Edema present.     Left lower leg: Edema present.  Skin:    General: Skin is warm and dry.  Neurological:     Mental Status: He is alert.   Laboratory examination:   CMP Latest Ref Rng & Units 09/16/2015 09/15/2015 09/14/2015  Glucose 65 - 99 mg/dL 109(H) 146(H) 142(H)  BUN 6 - 20 mg/dL _3 Creatinine 0.61 - 1.24 mg/dL 0.97 1.03 0.92  Sodium 135 - 145 mmol/L 137 133(L) 132(L)  Potassium 3.5 - 5.1 mmol/L 4.5 3.8 3.8  Chloride 101 - 111 mmol/L 106 102 101  CO2 22 - 32 mmol/L _4 Calcium 8.9 - 10.3 mg/dL 8.5(L) 8.6(L) 8.4(L)  Total Protein 6.5 - 8.1 g/dL - 5.2(L) 5.5(L)  Total Bilirubin 0.3 - 1.2 mg/dL - 1.8(H) 2.0(H)  Alkaline Phos 38 - 126 U/L - 56 56  AST 15 - 41 U/L - 53(H) 71(H)  ALT 17 - 63 U/L - 61 68(H)   CBC Latest Ref Rng & Units 09/15/2015 09/14/2015  09/13/2015  WBC 4.0 - 10.5 K/uL 15.6(H) 16.6(H) 24.8(H)  Hemoglobin 13.0 - 17.0 g/dL 8.6(L) 8.8(L) 10.0(L)  Hematocrit 39.0 - 52.0 % 25.5(L) 25.8(L) 29.1(L)  Platelets 150 - 400 K/uL 162 136(L) 176   Lipid Panel  No results found for:  CHOL, TRIG, HDL, CHOLHDL, VLDL, LDLCALC, LDLDIRECT HEMOGLOBIN A1C Lab Results  Component Value Date   HGBA1C 5.6 09/12/2015   MPG 114 09/12/2015   TSH No results for input(s): TSH in the last 8760 hours.  External labs:  10/03/2021: INR 2.4  08/02/2021: Total cholesterol 111, triglycerides 125, HDL 36, LDL 53 BUN 19, creatinine 1.25, GFR 59, sodium 136, potassium 3.8 Hgb 12.0, HCT 35.3, MCV 97.3, platelet 157  07/29/2020:  A1C 5.5%  BUN 16, creatinine 1.25, EGFR 55, sodium 139, potassium 4.6 Hemoglobin 13.4, hematocrit 39.8, platelet 196  01/26/2020:  Total cholesterol 120, triglycerides 77, HDL 38, LDL 66  Allergies   Allergies  Allergen Reactions   Lisinopril Swelling    Tongue swelling    Medications Prior to Visit:   Outpatient Medications Prior to Visit  Medication Sig Dispense Refill   ALPRAZolam (XANAX) 0.5 MG tablet Take 0.5 mg by mouth at bedtime as needed for anxiety.     amLODipine (NORVASC) 5 MG tablet TAKE 1 TABLET BY MOUTH ONCE DAILY 90 tablet 3   FLUoxetine (PROZAC) 20 MG capsule Take 20 mg by mouth daily.     folic acid (FOLVITE) 1 MG tablet Take 1 tablet (1 mg total) by mouth daily.     furosemide (LASIX) 40 MG tablet Take 80 mg by mouth 2 (two) times daily.      loratadine (CLARITIN) 10 MG tablet TAKE 1 TABLET BY MOUTH EVERY DAY 30 tablet 0   metFORMIN (GLUCOPHAGE) 500 MG tablet Take 1 tablet (500 mg total) by mouth 2 (two) times daily with a meal. (Patient taking differently: Take 500 mg by mouth daily with breakfast.)     pantoprazole (PROTONIX) 40 MG tablet Take 1 tablet (40 mg total) by mouth daily. 60 tablet 2   potassium chloride SA (K-DUR,KLOR-CON) 20 MEQ tablet Take 1 tablet (20 mEq total) by mouth daily. 60  tablet 3   pravastatin (PRAVACHOL) 20 MG tablet Take 1 tablet (20 mg total) by mouth daily after supper.     valsartan-hydrochlorothiazide (DIOVAN-HCT) 320-25 MG tablet TAKE 1 TABLET BY MOUTH  DAILY 90 tablet 3   warfarin (COUMADIN) 1 MG tablet Take 1 mg by mouth daily. Monday and Friday 0.5 mg daily and all other days 1 mg by mouth daily.     sildenafil (VIAGRA) 100 MG tablet Take 100 mg by mouth daily as needed for erectile dysfunction.     No facility-administered medications prior to visit.   Final Medications at End of Visit    Current Meds  Medication Sig   ALPRAZolam (XANAX) 0.5 MG tablet Take 0.5 mg by mouth at bedtime as needed for anxiety.   amLODipine (NORVASC) 5 MG tablet TAKE 1 TABLET BY MOUTH ONCE DAILY   FLUoxetine (PROZAC) 20 MG capsule Take 20 mg by mouth daily.   folic acid (FOLVITE) 1 MG tablet Take 1 tablet (1 mg total) by mouth daily.   furosemide (LASIX) 40 MG tablet Take 80 mg by mouth 2 (two) times daily.    loratadine (CLARITIN) 10 MG tablet TAKE 1 TABLET BY MOUTH EVERY DAY   metFORMIN (GLUCOPHAGE) 500 MG tablet Take 1 tablet (500 mg total) by mouth 2 (two) times daily with a meal. (Patient taking differently: Take 500 mg by mouth daily with breakfast.)   pantoprazole (PROTONIX) 40 MG tablet Take 1 tablet (40 mg total) by mouth daily.   potassium chloride SA (K-DUR,KLOR-CON) 20 MEQ tablet Take 1 tablet (20 mEq total) by mouth daily.  pravastatin (PRAVACHOL) 20 MG tablet Take 1 tablet (20 mg total) by mouth daily after supper.   valsartan-hydrochlorothiazide (DIOVAN-HCT) 320-25 MG tablet TAKE 1 TABLET BY MOUTH  DAILY   warfarin (COUMADIN) 1 MG tablet Take 1 mg by mouth daily. Monday and Friday 0.5 mg daily and all other days 1 mg by mouth daily.   Radiology  No results found.  Cardiac Studies:   Lexiscan stress 07/19/12:  Prominent diaphragmatic attenuation. LVEF normal. Low risk stress.    Caotid duplex 05/29/11:  Bilateral carotid tortuosity     Echocardiogram 09/13/2015 :   - Left ventricle: The cavity size was normal. Systolic function was  normal. The estimated ejection fraction was in the range of 60%  to 65%. Wall motion was normal; there were no regional wall  motion abnormalities. - Right ventricle: Poorly visualized.   EKG  10/19/2021: Atrial fibrillation with controlled ventricular response at a rate of 52 bpm.  Normal axis.  Incomplete right bundle branch block.  Poor R wave progression, cannot exclude anteroseptal infarct old.  Low voltage complexes.  Compared EKG 10/19/2020, no significant change.  Consider pulmonary disease pattern.  Assessment     ICD-10-CM   1. Permanent atrial fibrillation (HCC)  I48.21 EKG 12-Lead    2. Essential hypertension  I10     3. OSA (obstructive sleep apnea)  G47.33        No orders of the defined types were placed in this encounter.  There are no discontinued medications.    Recommendations:   Fuller Makin is a 80 y.o. male with  morbid obesity, OSA on CPAP, hypertension, chronic leg edema, diabetes mellitus, and permanent atrial fibrillation on Coumadin, which is managed by his PCP.   Patient presents for 1 year follow-up.  He remains asymptomatic.  Weight has continued to trend down since December 2021.  I personally reviewed external labs, lipids are under excellent control.  Blood pressure was elevated in the office initially, however it is well controlled on recheck.  Reviewed and discussed results of echocardiogram and carotid artery duplex, details above.  Echo remained stable and carotid duplex shows no significant carotid artery disease.  Patient continues to tolerate anticoagulation without bleeding diathesis.  He remains in atrial fibrillation which is well rate controlled.  Will defer INR management to PCP on Coumadin.  Will not make changes to medications at today's office visit.  There is no clinical evidence of acute decompensated heart failure at this  time.  Follow-up in 1 year, sooner if needed, for atrial fibrillation, hypertension.   Alethia Berthold, PA-C 10/19/2021, 2:23 PM Office: [phone]

## 2021-11-08 ENCOUNTER — Telehealth (INDEPENDENT_AMBULATORY_CARE_PROVIDER_SITE_OTHER): Payer: MEDICARE | Admitting: Adult Health

## 2021-11-08 DIAGNOSIS — Z9989 Dependence on other enabling machines and devices: Secondary | ICD-10-CM

## 2021-11-08 DIAGNOSIS — G4733 Obstructive sleep apnea (adult) (pediatric): Secondary | ICD-10-CM | POA: Diagnosis not present

## 2021-11-08 NOTE — Progress Notes (Signed)
PATIENT: Edwin [name] DOB: [birth year]  REASON FOR VISIT: follow up HISTORY FROM: patient PRIMARY NEUROLOGIST:   Virtual Visit via Video Note  I connected with Edwin [name] on 11/08/21 at  2:00 PM EST by a video enabled telemedicine application located remotely at [facility] and verified that I am speaking with the correct person using two identifiers who was located at their own home.   I discussed the limitations of evaluation and management by telemedicine and the availability of in person appointments. The patient expressed understanding and agreed to proceed.   PATIENT: Edwin [name] DOB: June 27, 1941  REASON FOR VISIT: follow up HISTORY FROM: patient  HISTORY OF PRESENT ILLNESS: Today 11/08/21:  Edwin [name] is a 80 year old male with a history of obstructive sleep apnea on CPAP.  He returns today for follow-up.  He reports that the CPAP continues to work well for him.  He states his only issue is the strap cuts into the back of his neck.  Even replacing the strap with a new one does not help.  He returns today for an evaluation    REVIEW OF SYSTEMS: Out of a complete 14 system review of symptoms, the patient complains only of the following symptoms, and all other reviewed systems are negative.  ALLERGIES: Allergies  Allergen Reactions   Lisinopril Swelling    Tongue swelling    HOME MEDICATIONS: Outpatient Medications Prior to Visit  Medication Sig Dispense Refill   ALPRAZolam (XANAX) 0.5 MG tablet Take 0.5 mg by mouth at bedtime as needed for anxiety.     amLODipine (NORVASC) 5 MG tablet TAKE 1 TABLET BY MOUTH ONCE DAILY 90 tablet 3   FLUoxetine (PROZAC) 20 MG capsule Take 20 mg by mouth daily.     folic acid (FOLVITE) 1 MG tablet Take 1 tablet (1 mg total) by mouth daily.     furosemide (LASIX) 40 MG tablet Take 80 mg by mouth 2 (two) times daily.      loratadine (CLARITIN) 10 MG tablet TAKE 1 TABLET BY MOUTH EVERY DAY 30 tablet 0   metFORMIN  (GLUCOPHAGE) 500 MG tablet Take 1 tablet (500 mg total) by mouth 2 (two) times daily with a meal. (Patient taking differently: Take 500 mg by mouth daily with breakfast.)     pantoprazole (PROTONIX) 40 MG tablet Take 1 tablet (40 mg total) by mouth daily. 60 tablet 2   potassium chloride SA (K-DUR,KLOR-CON) 20 MEQ tablet Take 1 tablet (20 mEq total) by mouth daily. 60 tablet 3   pravastatin (PRAVACHOL) 20 MG tablet Take 1 tablet (20 mg total) by mouth daily after supper.     sildenafil (VIAGRA) 100 MG tablet Take 100 mg by mouth daily as needed for erectile dysfunction.     valsartan-hydrochlorothiazide (DIOVAN-HCT) 320-25 MG tablet TAKE 1 TABLET BY MOUTH  DAILY 90 tablet 3   warfarin (COUMADIN) 1 MG tablet Take 1 mg by mouth daily. Monday and Friday 0.5 mg daily and all other days 1 mg by mouth daily.     No facility-administered medications prior to visit.    PAST MEDICAL HISTORY: Past Medical History:  Diagnosis Date   Adynamic ileus ([place])    Anxiety    Arthritis    Atrial fibrillation ([place]) CARDIOLOGIST-- DR Einar Gip   Cholelithiasis    Chronic right-sided CHF (congestive heart failure) (HCC)    Colon polyps    TUBULAR ADENOMA (2) & HYPERPLASTIC POLYP   Cor pulmonale, chronic (HCC)    Diabetes mellitus  NIDDM   Gastric ulcer 07/28/2014   GERD (gastroesophageal reflux disease)    GI bleed    Hypertension    benign essential hypertension   Long-term (current) use of anticoagulants    Morbid obesity (HCC)    OSA (obstructive sleep apnea) 08/29/2013   OSA (obstructive sleep apnea)    Pulmonary hypertension (HCC) MODERATE   Pure hypercholesterolemia    Shortness of breath    with exertion    Sleep apnea    no CPAP   Varicose veins of legs     PAST SURGICAL HISTORY: Past Surgical History:  Procedure Laterality Date   benign mole removal from nose      CARDIOVASCULAR STRESS TEST  07-19-2012  DR Einar Gip   PROMINENT DIAPHRAGMATIC ATTENUATION/ NORMAL LVEF/ LOW RISK STUDY    COLONOSCOPY     ESOPHAGOGASTRODUODENOSCOPY (EGD) WITH PROPOFOL N/A 07/28/2014   Procedure: ESOPHAGOGASTRODUODENOSCOPY (EGD) WITH PROPOFOL;  Surgeon: Irene Shipper, MD;  Location: [facility];  Service: Endoscopy;  Laterality: N/A;   KNEE ARTHROSCOPY WITH MEDIAL MENISECTOMY Left 04/23/2013   Procedure: LEFT KNEE ARTHROSCOPY WITH PARTIAL MEDIAL MENISECTOMY;  Surgeon: Magnus Sinning, MD;  Location: [facility];  Service: Orthopedics;  Laterality: Left;   MOUTH SURGERY     TRANSTHORACIC ECHOCARDIOGRAM  05-16-2012   LOW NORMAL LVEF/ MOD. RV/ MILD HYPOKINESIS/ MOD. PULMONARY HTN/ CHRONIC COR PUMONALE/ NO SIG. CHANGE FROM 12-01-2010    FAMILY HISTORY: Family History  Problem Relation Age of Onset   Heart disease Mother    Hypertension Father    Cancer Father    Throat cancer Paternal Uncle    Colon cancer Neg Hx    Prostate cancer Neg Hx    Rectal cancer Neg Hx    Stomach cancer Neg Hx     SOCIAL HISTORY: Social History   Socioeconomic History   Marital status: Widowed    Spouse name: Not on file   Number of children: 4   Years of education: Not on file   Highest education level: Not on file  Occupational History   Occupation: retired  Tobacco Use   Smoking status: Former    Packs/day: 1.00    Years: 20.00    Pack years: 20.00    Types: Cigarettes    Quit date: 06/27/1985    Years since quitting: 36.3   Smokeless tobacco: Never  Substance and Sexual Activity   Alcohol use: Yes    Alcohol/week: 0.0 standard drinks    Comment: beer 3-4 daily   Drug use: No   Sexual activity: Not on file  Other Topics Concern   Not on file  Social History Narrative   Widowed and retired   No tobacco, + ETOH no drugs   Social Determinants of Radio broadcast assistant Strain: Not on file  Food Insecurity: Not on file  Transportation Needs: Not on file  Physical Activity: Not on file  Stress: Not on file  Social Connections: Not on file  Intimate Partner Violence: Not on file       PHYSICAL EXAM Generalized: Well developed, in no acute distress   Neurological examination  Mentation: Alert oriented to time, place, history taking. Follows all commands speech and language fluent Cranial nerve II-XII:Extraocular movements were full. Facial symmetry noted.  Head turning and shoulder shrug  were normal and symmetric.  Reflexes: UTA  DIAGNOSTIC DATA (LABS, IMAGING, TESTING) - I reviewed patient records, labs, notes, testing and imaging myself where available.  Lab Results  Component Value Date  WBC 15.6 (H) 09/15/2015   HGB 8.6 (L) 09/15/2015   HCT 25.5 (L) 09/15/2015   MCV 95.9 09/15/2015   PLT 162 09/15/2015      Component Value Date/Time   NA 137 09/16/2015 0319   K 4.5 09/16/2015 0319   CL 106 09/16/2015 0319   CO2 26 09/16/2015 0319   GLUCOSE 109 (H) 09/16/2015 0319   BUN 11 09/16/2015 0319   CREATININE 0.97 09/16/2015 0319   CALCIUM 8.5 (L) 09/16/2015 0319   PROT 5.2 (L) 09/15/2015 0312   ALBUMIN 2.7 (L) 09/15/2015 0312   AST 53 (H) 09/15/2015 0312   ALT 61 09/15/2015 0312   ALKPHOS 56 09/15/2015 0312   BILITOT 1.8 (H) 09/15/2015 0312   GFRNONAA >60 09/16/2015 0319   GFRAA >60 09/16/2015 0319   No results found for: CHOL, HDL, LDLCALC, LDLDIRECT, TRIG, CHOLHDL Lab Results  Component Value Date   HGBA1C 5.6 09/12/2015   No results found for: VITAMINB12 Lab Results  Component Value Date   TSH 1.950 07/26/2014      ASSESSMENT AND PLAN 80 y.o. year old male  has a past medical history of Adynamic ileus ([place]), Anxiety, Arthritis, Atrial fibrillation ([place]) (CARDIOLOGIST-- DR Einar Gip), Cholelithiasis, Chronic right-sided CHF (congestive heart failure) ([place]), Colon polyps, Cor pulmonale, chronic ([place]), Diabetes mellitus, Gastric ulcer (07/28/2014), GERD (gastroesophageal reflux disease), GI bleed, Hypertension, Long-term (current) use of anticoagulants, Morbid obesity ([place]), OSA (obstructive sleep apnea) (08/29/2013), OSA (obstructive sleep  apnea), Pulmonary hypertension ([place]) (MODERATE), Pure hypercholesterolemia, Shortness of breath, Sleep apnea, and Varicose veins of legs. here with:  OSA on CPAP  CPAP compliance excellent Residual AHI is good Encouraged patient to continue using CPAP nightly and > 4 hours each night Order placed for mask refitting F/U in 1 year or sooner if needed  I spent 20 minutes of face-to-face and non-face-to-face time with patient.  This included previsit chart review, lab review, study review, order entry, electronic health record documentation, patient education.  Ward Givens, MSN, NP-C 11/08/2021, 2:19 PM [facility] [address], [place], [place] [address] [phone]

## 2021-11-08 NOTE — Progress Notes (Signed)
Order for mask refitting sent to [facility].

## 2022-04-12 ENCOUNTER — Other Ambulatory Visit: Payer: Self-pay | Admitting: Cardiology

## 2022-08-16 ENCOUNTER — Telehealth: Payer: Self-pay | Admitting: Adult Health

## 2022-08-16 DIAGNOSIS — G4733 Obstructive sleep apnea (adult) (pediatric): Secondary | ICD-10-CM

## 2022-08-16 NOTE — Telephone Encounter (Signed)
  Last seen 11/08/21 Next visit 11/07/22

## 2022-08-16 NOTE — Telephone Encounter (Signed)
Pt reached out to DME re: his request of a new CPAP since he had had his for 5 years.  [facility] informed him to contact our office for orders of a new mach, please call

## 2022-08-21 NOTE — Telephone Encounter (Signed)
Advise patient that we like to repeat HST before ordering new machine

## 2022-08-24 NOTE — Addendum Note (Signed)
Addended by: Gildardo Griffes on: 08/24/2022 10:07 AM   Modules accepted: Orders

## 2022-08-24 NOTE — Telephone Encounter (Signed)
Patient left a voicemail on my phone about scheduling at [place] but I don't see one ordered yet.

## 2022-08-24 NOTE — Addendum Note (Signed)
Addended by: Trudie Buckler on: 08/24/2022 01:05 PM   Modules accepted: Orders

## 2022-09-04 ENCOUNTER — Telehealth: Payer: Self-pay | Admitting: Adult Health

## 2022-09-04 DIAGNOSIS — G4733 Obstructive sleep apnea (adult) (pediatric): Secondary | ICD-10-CM

## 2022-09-04 NOTE — Telephone Encounter (Signed)
HST- Medicare/uhc no auth req.  Patient is scheduled at [facility] r 09/19/22 at 3 pm.  Mailed packet to the patient.

## 2022-09-19 ENCOUNTER — Ambulatory Visit: Payer: MEDICARE | Admitting: Neurology

## 2022-09-19 DIAGNOSIS — G4733 Obstructive sleep apnea (adult) (pediatric): Secondary | ICD-10-CM

## 2022-09-21 NOTE — Telephone Encounter (Signed)
r/s from 09/19/22 no reading found on device HST- Medicare/UHC no auth req .  Scheduled at [facility] for 10/10/22 at 3 pm.  Mailed packet to the patient.

## 2022-10-10 ENCOUNTER — Ambulatory Visit: Payer: MEDICARE | Admitting: Neurology

## 2022-10-16 NOTE — Addendum Note (Signed)
Addended by: Gildardo Griffes on: 10/16/2022 02:16 PM   Modules accepted: Orders

## 2022-10-16 NOTE — Telephone Encounter (Signed)
No reading from the HST that was done on 10/10/22. Patient finger probe wasn't on correctly. Since patient attempted two times he will have to do a in lab. Can you put a in lab order in for me for me to do the PA for his [facility] insurance.

## 2022-10-16 NOTE — Telephone Encounter (Signed)
NPSG ordered

## 2022-10-19 ENCOUNTER — Ambulatory Visit: Payer: MEDICARE | Admitting: Student

## 2022-10-19 ENCOUNTER — Ambulatory Visit: Payer: MEDICARE

## 2022-10-19 ENCOUNTER — Other Ambulatory Visit: Payer: MEDICARE

## 2022-10-19 VITALS — BP 137/51 | HR 39 | Ht 71.0 in | Wt 329.4 lb

## 2022-10-19 DIAGNOSIS — G4733 Obstructive sleep apnea (adult) (pediatric): Secondary | ICD-10-CM

## 2022-10-19 DIAGNOSIS — I4949 Other premature depolarization: Secondary | ICD-10-CM

## 2022-10-19 DIAGNOSIS — I4821 Permanent atrial fibrillation: Secondary | ICD-10-CM

## 2022-10-19 DIAGNOSIS — I10 Essential (primary) hypertension: Secondary | ICD-10-CM

## 2022-10-19 NOTE — Progress Notes (Signed)
Primary Physician/Referring:  Bernerd Limbo, MD  Patient ID: Edwin [name], [name]    DOB: May 07, 1941, 81 y.o.   MRN: [id number]  Chief Complaint  Patient presents with   Atrial Fibrillation   Hypertension   Follow-up    1 year    HPI: Edwin [name]  is a 81 y.o. [name]  with  morbid obesity, OSA on CPAP, hypertension, chronic leg edema, diabetes mellitus, and permanent atrial fibrillation on Coumadin, which is managed by his PCP.   Patient presents for 1 year follow-up.  Patient has lost an additional 20 pounds since 10/2020.  He does report dietary noncompliance, particularly with the holidays recently and unfortunately has gained 16 pounds since last visit  He reports bilateral leg edema has improved since last office visit.  He remains compliant with CPAP.  Denies chest pain, palpitations, syncope, near syncope.  Denies orthopnea, PND, symptoms suggestive of TIA or CVA.  Patient follows with PCP for management of INR on Coumadin.  He is tolerating anticoagulation without bleeding diathesis.  Past Medical History:  Diagnosis Date   Adynamic ileus ([place])    Anxiety    Arthritis    Atrial fibrillation (HCC) CARDIOLOGIST-- DR Einar Gip   Cholelithiasis    Chronic right-sided CHF (congestive heart failure) (HCC)    Colon polyps    TUBULAR ADENOMA (2) & HYPERPLASTIC POLYP   Cor pulmonale, chronic (HCC)    Diabetes mellitus    NIDDM   Gastric ulcer 07/28/2014   GERD (gastroesophageal reflux disease)    GI bleed    Hypertension    benign essential hypertension   Long-term (current) use of anticoagulants    Morbid obesity (HCC)    OSA (obstructive sleep apnea) 08/29/2013   OSA (obstructive sleep apnea)    Pulmonary hypertension (HCC) MODERATE   Pure hypercholesterolemia    Shortness of breath    with exertion    Sleep apnea    no CPAP   Varicose veins of legs    Family History  Problem Relation Age of Onset   Heart disease Mother    Hypertension Father    Cancer Father    Throat  cancer Paternal Uncle    Liver cancer Son    Colon cancer Neg Hx    Prostate cancer Neg Hx    Rectal cancer Neg Hx    Stomach cancer Neg Hx    Past Surgical History:  Procedure Laterality Date   benign mole removal from nose      CARDIOVASCULAR STRESS TEST  07-19-2012  DR Einar Gip   PROMINENT DIAPHRAGMATIC ATTENUATION/ NORMAL LVEF/ LOW RISK STUDY   COLONOSCOPY     ESOPHAGOGASTRODUODENOSCOPY (EGD) WITH PROPOFOL N/A 07/28/2014   Procedure: ESOPHAGOGASTRODUODENOSCOPY (EGD) WITH PROPOFOL;  Surgeon: Irene Shipper, MD;  Location: [facility];  Service: Endoscopy;  Laterality: N/A;   KNEE ARTHROSCOPY WITH MEDIAL MENISECTOMY Left 04/23/2013   Procedure: LEFT KNEE ARTHROSCOPY WITH PARTIAL MEDIAL MENISECTOMY;  Surgeon: Magnus Sinning, MD;  Location: [facility];  Service: Orthopedics;  Laterality: Left;   MOUTH SURGERY     TRANSTHORACIC ECHOCARDIOGRAM  05-16-2012   LOW NORMAL LVEF/ MOD. RV/ MILD HYPOKINESIS/ MOD. PULMONARY HTN/ CHRONIC COR PUMONALE/ NO SIG. CHANGE FROM 12-01-2010   Social History   Tobacco Use   Smoking status: Former    Packs/day: 1.00    Years: 20.00    Total pack years: 20.00    Types: Cigarettes    Quit date: 06/27/1985    Years since quitting: 69.3  Smokeless tobacco: Never  Substance Use Topics   Alcohol use: Yes    Alcohol/week: 0.0 standard drinks of alcohol    Comment: beer 3-4 daily   Marital Status: Widowed  ROS  Review of Systems  Constitutional: Positive for weight gain. Negative for weight loss (=).  Cardiovascular:  Positive for dyspnea on exertion (stable, chronic) and leg swelling (improved). Negative for syncope.  Psychiatric/Behavioral:  Positive for depression (stable).   All other systems reviewed and are negative.  Objective  Blood pressure (!) 137/51, pulse (!) 39, height '5\' 11"'$  (1.803 m), weight (!) 329 lb 6.4 oz (149.4 kg), SpO2 95 %. Body mass index is 45.94 kg/m.     10/19/2022    1:52 PM 10/19/2021    2:09 PM 10/19/2021    1:50 PM   Vitals with BMI  Height '5\' 11"'$   '5\' 11"'$   Weight 329 lbs 6 oz  313 lbs 13 oz  BMI 21.30  86.57  Systolic 846 962 952  Diastolic 51 54 56  Pulse 39 48 47   Physical Exam Neck:     Thyroid: No thyromegaly.     Vascular: No JVD.  Cardiovascular:     Rate and Rhythm: Normal rate. Rhythm irregular.     Pulses:          Carotid pulses are 2+ on the right side and 2+ on the left side with bruit.      Dorsalis pedis pulses are 0 on the right side and 0 on the left side.       Posterior tibial pulses are 0 on the right side and 0 on the left side.     Heart sounds: Murmur heard.     Midsystolic murmur is present at the upper right sternal border.     No gallop. No S3 or S4 sounds.     Comments: Femoral and popliteal pulse difficult to feel due to patient's body habitus. Pulmonary:     Effort: Pulmonary effort is normal. No respiratory distress.     Breath sounds: Normal breath sounds. No wheezing or rales.  Musculoskeletal:     Right lower leg: Edema present.     Left lower leg: Edema present.  Skin:    General: Skin is warm and dry.    Laboratory examination:      Latest Ref Rng & Units 09/16/2015    3:19 AM 09/15/2015    3:12 AM 09/14/2015    3:45 AM  CMP  Glucose 65 - 99 mg/dL 109  146  142   BUN 6 - 20 mg/dL '11  9  10   '$ Creatinine 0.61 - 1.24 mg/dL 0.97  1.03  0.92   Sodium 135 - 145 mmol/L 137  133  132   Potassium 3.5 - 5.1 mmol/L 4.5  3.8  3.8   Chloride 101 - 111 mmol/L 106  102  101   CO2 22 - 32 mmol/L '26  25  25   '$ Calcium 8.9 - 10.3 mg/dL 8.5  8.6  8.4   Total Protein 6.5 - 8.1 g/dL  5.2  5.5   Total Bilirubin 0.3 - 1.2 mg/dL  1.8  2.0   Alkaline Phos 38 - 126 U/L  56  56   AST 15 - 41 U/L  53  71   ALT 17 - 63 U/L  61  68       Latest Ref Rng & Units 09/15/2015    3:12 AM 09/14/2015    3:45  AM 09/13/2015    4:30 AM  CBC  WBC 4.0 - 10.5 K/uL 15.6  16.6  24.8   Hemoglobin 13.0 - 17.0 g/dL 8.6  8.8  10.0   Hematocrit 39.0 - 52.0 % 25.5  25.8  29.1    Platelets 150 - 400 K/uL 162  136  176    HEMOGLOBIN A1C Lab Results  Component Value Date   HGBA1C 5.6 09/12/2015   MPG 114 09/12/2015   External labs:  08/07/2022: Hemoglobin 11, hematocrit 33, MCV 96.6, platelets 144 Hemoglobin A1c 5.5% Potassium 3.9, sodium 136, glucose 165, BUN 31, creatinine 1.61 Cholesterol 88, triglycerides 99, HDL 32, LDL 37  Allergies   Allergies  Allergen Reactions   Lisinopril Swelling    Tongue swelling    Medications Prior to Visit:   Outpatient Medications Prior to Visit  Medication Sig Dispense Refill   ALPRAZolam (XANAX) 0.5 MG tablet Take 0.5 mg by mouth at bedtime as needed for anxiety.     amLODipine (NORVASC) 5 MG tablet TAKE 1 TABLET BY MOUTH ONCE DAILY 90 tablet 3   FLUoxetine (PROZAC) 20 MG capsule Take 20 mg by mouth daily.     folic acid (FOLVITE) 1 MG tablet Take 1 tablet (1 mg total) by mouth daily.     furosemide (LASIX) 40 MG tablet Take 80 mg by mouth 2 (two) times daily.      loratadine (CLARITIN) 10 MG tablet TAKE 1 TABLET BY MOUTH EVERY DAY 30 tablet 0   metFORMIN (GLUCOPHAGE) 500 MG tablet Take 1 tablet (500 mg total) by mouth 2 (two) times daily with a meal. (Patient taking differently: Take 500 mg by mouth daily with breakfast.)     pantoprazole (PROTONIX) 40 MG tablet Take 1 tablet (40 mg total) by mouth daily. 60 tablet 2   potassium chloride SA (K-DUR,KLOR-CON) 20 MEQ tablet Take 1 tablet (20 mEq total) by mouth daily. 60 tablet 3   pravastatin (PRAVACHOL) 20 MG tablet Take 1 tablet (20 mg total) by mouth daily after supper.     sildenafil (VIAGRA) 100 MG tablet Take 100 mg by mouth daily as needed for erectile dysfunction.     valsartan-hydrochlorothiazide (DIOVAN-HCT) 320-25 MG tablet TAKE 1 TABLET BY MOUTH  DAILY 90 tablet 3   warfarin (COUMADIN) 1 MG tablet Take 1 mg by mouth daily. Monday and Friday 0.5 mg daily and all other days 1 mg by mouth daily.     No facility-administered medications prior to visit.    Final Medications at End of Visit    Current Meds  Medication Sig   ALPRAZolam (XANAX) 0.5 MG tablet Take 0.5 mg by mouth at bedtime as needed for anxiety.   amLODipine (NORVASC) 5 MG tablet TAKE 1 TABLET BY MOUTH ONCE DAILY   FLUoxetine (PROZAC) 20 MG capsule Take 20 mg by mouth daily.   folic acid (FOLVITE) 1 MG tablet Take 1 tablet (1 mg total) by mouth daily.   furosemide (LASIX) 40 MG tablet Take 80 mg by mouth 2 (two) times daily.    loratadine (CLARITIN) 10 MG tablet TAKE 1 TABLET BY MOUTH EVERY DAY   metFORMIN (GLUCOPHAGE) 500 MG tablet Take 1 tablet (500 mg total) by mouth 2 (two) times daily with a meal. (Patient taking differently: Take 500 mg by mouth daily with breakfast.)   pantoprazole (PROTONIX) 40 MG tablet Take 1 tablet (40 mg total) by mouth daily.   potassium chloride SA (K-DUR,KLOR-CON) 20 MEQ tablet Take 1 tablet (20 mEq total)  by mouth daily.   pravastatin (PRAVACHOL) 20 MG tablet Take 1 tablet (20 mg total) by mouth daily after supper.   sildenafil (VIAGRA) 100 MG tablet Take 100 mg by mouth daily as needed for erectile dysfunction.   valsartan-hydrochlorothiazide (DIOVAN-HCT) 320-25 MG tablet TAKE 1 TABLET BY MOUTH  DAILY   warfarin (COUMADIN) 1 MG tablet Take 1 mg by mouth daily. Monday and Friday 0.5 mg daily and all other days 1 mg by mouth daily.   Radiology  No results found.  Cardiac Studies:   Lexiscan stress 07/19/12:  Prominent diaphragmatic attenuation. LVEF normal. Low risk stress.    Caotid duplex 05/29/11:  Bilateral carotid tortuosity    Echocardiogram 09/13/2015 :   - Left ventricle: The cavity size was normal. Systolic function was  normal. The estimated ejection fraction was in the range of 60%  to 65%. Wall motion was normal; there were no regional wall  motion abnormalities. - Right ventricle: Poorly visualized.   EKG  EKG 10/19/2022: Junctional escape rhythm with ventricular bigeminy.  Poor R wave progression and low voltage complexes,  pulmonary disease pattern.  Diffuse nonspecific T wave abnormality.  Compared to previous EKG on 10/19/2021, junctional escape rhythm with ventricular bigeminy is now present.  10/19/2021: Atrial fibrillation with controlled ventricular response at a rate of 52 bpm.  Normal axis.  Incomplete right bundle branch block.  Poor R wave progression, cannot exclude anteroseptal infarct old.  Low voltage complexes.  Compared EKG 10/19/2020, no significant change.  Consider pulmonary disease pattern.  Assessment     ICD-10-CM   1. Permanent atrial fibrillation (HCC)  I48.21 EKG 12-Lead    PCV ECHOCARDIOGRAM COMPLETE    2. 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 Essential hypertension  I10     4. OSA (obstructive sleep apnea)  G47.33        No orders of the defined types were placed in this encounter.  There are no discontinued medications.   Recommendations:   Edwin [name] is a 81 y.o. [name] with  morbid obesity, OSA on CPAP, hypertension, chronic leg edema, diabetes mellitus, and permanent atrial fibrillation on Coumadin, which is managed by his PCP.   Permanent atrial fibrillation (HCC) Junctional escape rhythm EKG EKG at today's visit revealed junctional escape rhythm with ventricular bigeminy with underlying heart rate of approximately 30-35. We will place a 14-day cardiac event monitor for evaluation of arrhythmias. We will schedule for echocardiogram to evaluate cardiac structure. We did have patient walk approximately 10 minutes during today's visit and heart rate increased to 58. He continues to tolerate warfarin without bleeding diathesis.  INR currently managed by PCP. No clinical evidence of heart failure on physical exam, bilateral lower extremity leg edema is chronic and has not progressively worsened.  Essential hypertension Blood pressure acceptable range. Continue current medications without changes. Discussed  importance of dietary changes including low-sodium diet, exercise as tolerated, and weight loss. He has gained 16 pounds since last visit. Independently reviewed and interpreted external labs, kidney function remained stable and lipids are under excellent control.  OSA (obstructive sleep apnea) He reports compliance with CPAP.  Total time spent with patient was 60 minutes and greater than 50% of that time was spent in counseling and coordination care with the patient regarding complex decision making and discussion as state above.  Follow-up in 4 weeks or sooner if needed.   Ernst Spell, [place] Office: [phone] Pager:  [phone]   

## 2022-11-06 ENCOUNTER — Encounter: Payer: Self-pay | Admitting: *Deleted

## 2022-11-06 NOTE — Progress Notes (Signed)
PATIENT: Edwin [name] DOB: [birth year]  REASON FOR VISIT: follow up HISTORY FROM: patient PRIMARY NEUROLOGIST: Dr. Frances Furbish  Virtual Visit via Video Note  I connected with Edwin [name] on 11/07/22 at  2:30 PM EST by a video enabled telemedicine application located remotely at [facility] and verified that I am speaking with the correct person using two identifiers who was located at their own home.   I discussed the limitations of evaluation and management by telemedicine and the availability of in person appointments. The patient expressed understanding and agreed to proceed.   PATIENT: Edwin [name] DOB: Dec 15, 1940  REASON FOR VISIT: follow up HISTORY FROM: patient  HISTORY OF PRESENT ILLNESS: Today 11/07/22:  Mr. Bumgardner is an 81 year old male with a history of obstructive sleep apnea on CPAP.  His download is below. Has repeat in lab testing next month. Tried HST twice but no data. After in lab study will order new machine pending results.      REVIEW OF SYSTEMS: Out of a complete 14 system review of symptoms, the patient complains only of the following symptoms, and all other reviewed systems are negative.  ALLERGIES: Allergies  Allergen Reactions   Lisinopril Swelling    Tongue swelling    HOME MEDICATIONS: Outpatient Medications Prior to Visit  Medication Sig Dispense Refill   ALPRAZolam (XANAX) 0.5 MG tablet Take 0.5 mg by mouth at bedtime as needed for anxiety.     amLODipine (NORVASC) 5 MG tablet TAKE 1 TABLET BY MOUTH ONCE DAILY 90 tablet 3   FLUoxetine (PROZAC) 20 MG capsule Take 20 mg by mouth daily.     folic acid (FOLVITE) 1 MG tablet Take 1 tablet (1 mg total) by mouth daily.     furosemide (LASIX) 40 MG tablet Take 80 mg by mouth 2 (two) times daily.      loratadine (CLARITIN) 10 MG tablet TAKE 1 TABLET BY MOUTH EVERY DAY 30 tablet 0   metFORMIN (GLUCOPHAGE) 500 MG tablet Take 1 tablet (500 mg total) by mouth 2 (two) times daily with a  meal. (Patient taking differently: Take 500 mg by mouth daily with breakfast.)     pantoprazole (PROTONIX) 40 MG tablet Take 1 tablet (40 mg total) by mouth daily. 60 tablet 2   potassium chloride SA (K-DUR,KLOR-CON) 20 MEQ tablet Take 1 tablet (20 mEq total) by mouth daily. 60 tablet 3   pravastatin (PRAVACHOL) 20 MG tablet Take 1 tablet (20 mg total) by mouth daily after supper.     sildenafil (VIAGRA) 100 MG tablet Take 100 mg by mouth daily as needed for erectile dysfunction.     valsartan-hydrochlorothiazide (DIOVAN-HCT) 320-25 MG tablet TAKE 1 TABLET BY MOUTH  DAILY 90 tablet 3   warfarin (COUMADIN) 1 MG tablet Take 1 mg by mouth daily. Monday and Friday 0.5 mg daily and all other days 1 mg by mouth daily.     No facility-administered medications prior to visit.    PAST MEDICAL HISTORY: Past Medical History:  Diagnosis Date   Adynamic ileus (HCC)    Anxiety    Arthritis    Atrial fibrillation (HCC) CARDIOLOGIST-- DR Jacinto Halim   Cholelithiasis    Chronic right-sided CHF (congestive heart failure) (HCC)    Colon polyps    TUBULAR ADENOMA (2) & HYPERPLASTIC POLYP   Cor pulmonale, chronic (HCC)    Diabetes mellitus    NIDDM   Gastric ulcer 07/28/2014   GERD (gastroesophageal reflux disease)    GI bleed  Hypertension    benign essential hypertension   Long-term (current) use of anticoagulants    Morbid obesity (HCC)    OSA (obstructive sleep apnea) 08/29/2013   OSA (obstructive sleep apnea)    Pulmonary hypertension (HCC) MODERATE   Pure hypercholesterolemia    Shortness of breath    with exertion    Sleep apnea    no CPAP   Varicose veins of legs     PAST SURGICAL HISTORY: Past Surgical History:  Procedure Laterality Date   benign mole removal from nose      CARDIOVASCULAR STRESS TEST  07-19-2012  DR Jacinto Halim   PROMINENT DIAPHRAGMATIC ATTENUATION/ NORMAL LVEF/ LOW RISK STUDY   COLONOSCOPY     ESOPHAGOGASTRODUODENOSCOPY (EGD) WITH PROPOFOL N/A 07/28/2014   Procedure:  ESOPHAGOGASTRODUODENOSCOPY (EGD) WITH PROPOFOL;  Surgeon: Hilarie Fredrickson, MD;  Location: [facility];  Service: Endoscopy;  Laterality: N/A;   KNEE ARTHROSCOPY WITH MEDIAL MENISECTOMY Left 04/23/2013   Procedure: LEFT KNEE ARTHROSCOPY WITH PARTIAL MEDIAL MENISECTOMY;  Surgeon: Drucilla Schmidt, MD;  Location: [facility];  Service: Orthopedics;  Laterality: Left;   MOUTH SURGERY     TRANSTHORACIC ECHOCARDIOGRAM  05-16-2012   LOW NORMAL LVEF/ MOD. RV/ MILD HYPOKINESIS/ MOD. PULMONARY HTN/ CHRONIC COR PUMONALE/ NO SIG. CHANGE FROM 12-01-2010    FAMILY HISTORY: Family History  Problem Relation Age of Onset   Heart disease Mother    Hypertension Father    Cancer Father    Throat cancer Paternal Uncle    Liver cancer Son    Colon cancer Neg Hx    Prostate cancer Neg Hx    Rectal cancer Neg Hx    Stomach cancer Neg Hx     SOCIAL HISTORY: Social History   Socioeconomic History   Marital status: Widowed    Spouse name: Not on file   Number of children: 4   Years of education: Not on file   Highest education level: Not on file  Occupational History   Occupation: retired  Tobacco Use   Smoking status: Former    Packs/day: 1.00    Years: 20.00    Total pack years: 20.00    Types: Cigarettes    Quit date: 06/27/1985    Years since quitting: 37.3   Smokeless tobacco: Never  Substance and Sexual Activity   Alcohol use: Yes    Alcohol/week: 0.0 standard drinks of alcohol    Comment: beer 3-4 daily   Drug use: No   Sexual activity: Not on file  Other Topics Concern   Not on file  Social History Narrative   Widowed and retired   No tobacco, + ETOH no drugs   Social Determinants of Corporate investment banker Strain: Not on file  Food Insecurity: Not on file  Transportation Needs: Not on file  Physical Activity: Not on file  Stress: Not on file  Social Connections: Not on file  Intimate Partner Violence: Not on file      PHYSICAL EXAM Generalized: Well developed, in no acute  distress   Neurological examination  Mentation: Alert oriented to time, place, history taking. Follows all commands speech and language fluent Cranial nerve II-XII:Extraocular movements were full. Facial symmetry noted. head turning and shoulder shrug  were normal and symmetric.   DIAGNOSTIC DATA (LABS, IMAGING, TESTING) - I reviewed patient records, labs, notes, testing and imaging myself where available.  Lab Results  Component Value Date   WBC 15.6 (H) 09/15/2015   HGB 8.6 (L) 09/15/2015  HCT 25.5 (L) 09/15/2015   MCV 95.9 09/15/2015   PLT 162 09/15/2015      Component Value Date/Time   NA 137 09/16/2015 0319   K 4.5 09/16/2015 0319   CL 106 09/16/2015 0319   CO2 26 09/16/2015 0319   GLUCOSE 109 (H) 09/16/2015 0319   BUN 11 09/16/2015 0319   CREATININE 0.97 09/16/2015 0319   CALCIUM 8.5 (L) 09/16/2015 0319   PROT 5.2 (L) 09/15/2015 0312   ALBUMIN 2.7 (L) 09/15/2015 0312   AST 53 (H) 09/15/2015 0312   ALT 61 09/15/2015 0312   ALKPHOS 56 09/15/2015 0312   BILITOT 1.8 (H) 09/15/2015 0312   GFRNONAA >60 09/16/2015 0319   GFRAA >60 09/16/2015 0319   No results found for: "CHOL", "HDL", "LDLCALC", "LDLDIRECT", "TRIG", "CHOLHDL" Lab Results  Component Value Date   HGBA1C 5.6 09/12/2015   No results found for: "VITAMINB12" Lab Results  Component Value Date   TSH 1.950 07/26/2014      ASSESSMENT AND PLAN 81 y.o. year old male  has a past medical history of Adynamic ileus (HCC), Anxiety, Arthritis, Atrial fibrillation (HCC) (CARDIOLOGIST-- DR Jacinto Halim), Cholelithiasis, Chronic right-sided CHF (congestive heart failure) (HCC), Colon polyps, Cor pulmonale, chronic (HCC), Diabetes mellitus, Gastric ulcer (07/28/2014), GERD (gastroesophageal reflux disease), GI bleed, Hypertension, Long-term (current) use of anticoagulants, Morbid obesity (HCC), OSA (obstructive sleep apnea) (08/29/2013), OSA (obstructive sleep apnea), Pulmonary hypertension (HCC) (MODERATE), Pure  hypercholesterolemia, Shortness of breath, Sleep apnea, and Varicose veins of legs. here with:  OSA on CPAP  CPAP compliance excellent Residual AHI is good Encouraged patient to continue using CPAP nightly and > 4 hours each night F/U in 1 year or sooner if needed    Butch Penny, MSN, NP-C 11/07/2022, 12:47 PM [facility] [address] [place], [place] [address] [phone]

## 2022-11-07 ENCOUNTER — Telehealth (INDEPENDENT_AMBULATORY_CARE_PROVIDER_SITE_OTHER): Payer: MEDICARE | Admitting: Adult Health

## 2022-11-07 DIAGNOSIS — G4733 Obstructive sleep apnea (adult) (pediatric): Secondary | ICD-10-CM | POA: Diagnosis not present

## 2022-11-09 ENCOUNTER — Ambulatory Visit: Payer: MEDICARE

## 2022-11-09 ENCOUNTER — Telehealth: Payer: Self-pay

## 2022-11-09 DIAGNOSIS — I4949 Other premature depolarization: Secondary | ICD-10-CM

## 2022-11-09 DIAGNOSIS — I4821 Permanent atrial fibrillation: Secondary | ICD-10-CM

## 2022-11-09 NOTE — Telephone Encounter (Signed)
Zio  called to report an abnormal result from monitor. Slow A-fib, 27 bpm, for 60 seconds on 10/21/2022 at 4:49am

## 2022-11-10 NOTE — Telephone Encounter (Signed)
He has permanent atrial fib. We are looking for Pauses, so all good

## 2022-11-23 ENCOUNTER — Ambulatory Visit (INDEPENDENT_AMBULATORY_CARE_PROVIDER_SITE_OTHER): Payer: MEDICARE | Admitting: Neurology

## 2022-11-23 DIAGNOSIS — G4733 Obstructive sleep apnea (adult) (pediatric): Secondary | ICD-10-CM | POA: Diagnosis not present

## 2022-11-23 DIAGNOSIS — G4720 Circadian rhythm sleep disorder, unspecified type: Secondary | ICD-10-CM

## 2022-11-23 DIAGNOSIS — R9431 Abnormal electrocardiogram [ECG] [EKG]: Secondary | ICD-10-CM

## 2022-11-28 NOTE — Procedures (Unsigned)
Physician Interpretation:   Referred by: Ward Givens, NP  History: 82 year old male with an underlying complex medical history of diabetes, reflux disease, hypertension, pulmonary hypertension, hyperlipidemia, anxiety, arthritis, chronic A-fib, congestive heart failure, obstructive sleep apnea, on CPAP therapy, and morbid obesity, who presents for evaluation of his obstructive sleep apnea.  He has an older machine, he has been compliant with CPAP of 16 cm with good apnea control and ongoing benefit.  TITRATION DETAILS (SEE ALSO TABLE AT THE END OF THE REPORT):  The patient qualified for an emergency split sleep study per AASM standards. The baseline AHI was 57.7/h, and O2 nadir 67%. The patient was shown several different interfaces and was subsequently fitted with a medium fullface mask from [employer], [employer] and CPAP was started at 5 cm and gradually increased to a final pressure of 18 cm at which point he achieved a total sleep time of 51 minutes, with supine REM sleep achieved, residual AHI 7.1/h, O2 nadir 88%.   EEG: Review of the EEG showed no abnormal electrical discharges and symmetrical bihemispheric findings.    EKG: The EKG revealed irregularities including evidence of A-fib, and PVCs.    AUDIO/VIDEO REVIEW: The audio and video review did not show any abnormal or unusual behaviors, movements, phonations or vocalizations. The patient took 1 restroom break.  Mild to moderate snoring was detected.  POST-STUDY QUESTIONNAIRE: Post study, the patient indicated, that sleep was *** the same as usual. The patient stated ***  IMPRESSION:   Severe obstructive sleep apnea (OSA) Dysfunctions associated with sleep stages or arousal from sleep Non-specific abnormal electrocardiogram (EKG)  RECOMMENDATIONS:   This patient has severe obstructive sleep apnea. The patient qualified for an emergency split sleep study per AASM standards. The baseline AHI was 57.7/h, and O2 nadir 67%.  The  patient responded well to PAP therapy. CPAP of 18 cm with EPR of 2 resulted in significant reduction of his sleep disordered breathing.  I, therefore, recommend home CPAP therapy at a pressure of 18 cm via medium Simplus fullface mask with heated humidity (or mask of choice, sized to fit, EPR as per tolerance). The patient will be advised to be fully compliant with PAP therapy to improve sleep related symptoms and decrease long term cardiovascular risks. Please note, that untreated obstructive sleep apnea may carry additional perioperative morbidity. Patients with significant obstructive sleep apnea should receive perioperative PAP therapy and the surgeons and particularly the anesthesiologist should be informed of the diagnosis and the severity of the sleep disordered breathing. This study shows sleep fragmentation and abnormal sleep stage percentages; these are nonspecific findings and per se do not signify an intrinsic sleep disorder or a cause for the patient's sleep-related symptoms. Causes include (but are not limited to) the first night effect of the sleep study, circadian rhythm disturbances, medication effect or an underlying mood disorder or medical problem.  The study showed evidence of A-fib and PVCs on single lead EKG; clinical correlation is recommended.  The patient is well-known to cardiology.  The patient should be cautioned not to drive, work at heights, or operate dangerous or heavy equipment when tired or sleepy. Review and reiteration of good sleep hygiene measures should be pursued with any patient. The patient will be seen in follow-up in the sleep clinic at [facility] for discussion of the test results, symptom and treatment compliance review, further management strategies, etc. The referring provider will be notified of the test results.  I certify that I have reviewed the entire raw data  recording prior to the issuance of this report in accordance with the Standards of Accreditation of the  [place] [employer] (AASM).  Star Age, MD, PhD Medical Director, [facility]) Diplomat, ABPN (Neurology and Sleep)   Technical Report:   ***  Titration Table:   ***

## 2022-11-29 ENCOUNTER — Ambulatory Visit: Payer: MEDICARE

## 2022-11-29 ENCOUNTER — Ambulatory Visit: Payer: MEDICARE | Admitting: Internal Medicine

## 2022-11-29 VITALS — BP 130/69 | HR 55 | Ht 71.0 in | Wt 311.2 lb

## 2022-11-29 DIAGNOSIS — G4733 Obstructive sleep apnea (adult) (pediatric): Secondary | ICD-10-CM

## 2022-11-29 DIAGNOSIS — I4821 Permanent atrial fibrillation: Secondary | ICD-10-CM

## 2022-11-29 DIAGNOSIS — I10 Essential (primary) hypertension: Secondary | ICD-10-CM

## 2022-11-29 NOTE — Progress Notes (Signed)
Primary Physician/Referring:  Bernerd Limbo, MD  Patient ID: Edwin [name], male    DOB: 10-Jul-1941, 82 y.o.   MRN: [id number]  Chief Complaint  Patient presents with   Junctional escape rhythm   Results   Follow-up    HPI: Edwin [name]  is a 82 y.o. male  with  morbid obesity, OSA on CPAP, hypertension, chronic leg edema, diabetes mellitus, and permanent atrial fibrillation on Coumadin, which is managed by his PCP.   Patient presents for 4 week follow-up.  Patient has been doing well since his last visit. He is still on his weight loss journey and had a little weight gain around the holidays. He remains compliant with CPAP.  Denies chest pain, palpitations, syncope, near syncope.  Denies orthopnea, PND, symptoms suggestive of TIA or CVA.  Patient follows with PCP for management of INR on Coumadin.  He is tolerating anticoagulation without bleeding diathesis.  Past Medical History:  Diagnosis Date   Adynamic ileus ([place])    Anxiety    Arthritis    Atrial fibrillation (HCC) CARDIOLOGIST-- DR Einar Gip   Cholelithiasis    Chronic right-sided CHF (congestive heart failure) (HCC)    Colon polyps    TUBULAR ADENOMA (2) & HYPERPLASTIC POLYP   Cor pulmonale, chronic (HCC)    Diabetes mellitus    NIDDM   Gastric ulcer 07/28/2014   GERD (gastroesophageal reflux disease)    GI bleed    Hypertension    benign essential hypertension   Long-term (current) use of anticoagulants    Morbid obesity (HCC)    OSA (obstructive sleep apnea) 08/29/2013   OSA (obstructive sleep apnea)    Pulmonary hypertension (HCC) MODERATE   Pure hypercholesterolemia    Shortness of breath    with exertion    Sleep apnea    no CPAP   Varicose veins of legs    Family History  Problem Relation Age of Onset   Heart disease Mother    Hypertension Father    Cancer Father    Throat cancer Paternal Uncle    Liver cancer Son    Colon cancer Neg Hx    Prostate cancer Neg Hx    Rectal cancer Neg Hx    Stomach  cancer Neg Hx    Past Surgical History:  Procedure Laterality Date   benign mole removal from nose      CARDIOVASCULAR STRESS TEST  07-19-2012  DR Einar Gip   PROMINENT DIAPHRAGMATIC ATTENUATION/ NORMAL LVEF/ LOW RISK STUDY   COLONOSCOPY     ESOPHAGOGASTRODUODENOSCOPY (EGD) WITH PROPOFOL N/A 07/28/2014   Procedure: ESOPHAGOGASTRODUODENOSCOPY (EGD) WITH PROPOFOL;  Surgeon: Irene Shipper, MD;  Location: [facility];  Service: Endoscopy;  Laterality: N/A;   KNEE ARTHROSCOPY WITH MEDIAL MENISECTOMY Left 04/23/2013   Procedure: LEFT KNEE ARTHROSCOPY WITH PARTIAL MEDIAL MENISECTOMY;  Surgeon: Magnus Sinning, MD;  Location: [facility];  Service: Orthopedics;  Laterality: Left;   MOUTH SURGERY     TRANSTHORACIC ECHOCARDIOGRAM  05-16-2012   LOW NORMAL LVEF/ MOD. RV/ MILD HYPOKINESIS/ MOD. PULMONARY HTN/ CHRONIC COR PUMONALE/ NO SIG. CHANGE FROM 12-01-2010   Social History   Tobacco Use   Smoking status: Former    Packs/day: 1.00    Years: 20.00    Total pack years: 20.00    Types: Cigarettes    Quit date: 06/27/1985    Years since quitting: 37.4   Smokeless tobacco: Never  Substance Use Topics   Alcohol use: Yes    Alcohol/week: 0.0 standard drinks of  alcohol    Comment: beer 3-4 daily   Marital Status: Widowed  ROS  Review of Systems  Constitutional: Positive for weight gain. Negative for weight loss (=).  Cardiovascular:  Positive for dyspnea on exertion (stable, chronic) and leg swelling (improved). Negative for syncope.  Psychiatric/Behavioral:  Positive for depression (stable).   All other systems reviewed and are negative.  Objective  Blood pressure 130/69, pulse (!) 55, height '5\' 11"'$  (1.803 m), weight (!) 311 lb 3.2 oz (141.2 kg), SpO2 97 %. Body mass index is 43.4 kg/m.     11/29/2022    1:45 PM 10/19/2022    1:52 PM 10/19/2021    2:09 PM  Vitals with BMI  Height '5\' 11"'$  '5\' 11"'$    Weight 311 lbs 3 oz 329 lbs 6 oz   BMI 62.94 76.54   Systolic 650 354 656  Diastolic 69 51 54   Pulse 55 39 48   Physical Exam Neck:     Thyroid: No thyromegaly.     Vascular: No JVD.  Cardiovascular:     Rate and Rhythm: Normal rate. Rhythm irregular.     Pulses:          Carotid pulses are 2+ on the right side and 2+ on the left side with bruit.      Dorsalis pedis pulses are 0 on the right side and 0 on the left side.       Posterior tibial pulses are 0 on the right side and 0 on the left side.     Heart sounds: Murmur heard.     Midsystolic murmur is present at the upper right sternal border.     No gallop. No S3 or S4 sounds.     Comments: Femoral and popliteal pulse difficult to feel due to patient's body habitus. Pulmonary:     Effort: Pulmonary effort is normal. No respiratory distress.     Breath sounds: Normal breath sounds. No wheezing or rales.  Musculoskeletal:     Right lower leg: Edema present.     Left lower leg: Edema present.  Skin:    General: Skin is warm and dry.    Laboratory examination:      Latest Ref Rng & Units 09/16/2015    3:19 AM 09/15/2015    3:12 AM 09/14/2015    3:45 AM  CMP  Glucose 65 - 99 mg/dL 109  146  142   BUN 6 - 20 mg/dL '11  9  10   '$ Creatinine 0.61 - 1.24 mg/dL 0.97  1.03  0.92   Sodium 135 - 145 mmol/L 137  133  132   Potassium 3.5 - 5.1 mmol/L 4.5  3.8  3.8   Chloride 101 - 111 mmol/L 106  102  101   CO2 22 - 32 mmol/L '26  25  25   '$ Calcium 8.9 - 10.3 mg/dL 8.5  8.6  8.4   Total Protein 6.5 - 8.1 g/dL  5.2  5.5   Total Bilirubin 0.3 - 1.2 mg/dL  1.8  2.0   Alkaline Phos 38 - 126 U/L  56  56   AST 15 - 41 U/L  53  71   ALT 17 - 63 U/L  61  68       Latest Ref Rng & Units 09/15/2015    3:12 AM 09/14/2015    3:45 AM 09/13/2015    4:30 AM  CBC  WBC 4.0 - 10.5 K/uL 15.6  16.6  24.8  Hemoglobin 13.0 - 17.0 g/dL 8.6  8.8  10.0   Hematocrit 39.0 - 52.0 % 25.5  25.8  29.1   Platelets 150 - 400 K/uL 162  136  176    HEMOGLOBIN A1C Lab Results  Component Value Date   HGBA1C 5.6 09/12/2015   MPG 114 09/12/2015    External labs:  08/07/2022: Hemoglobin 11, hematocrit 33, MCV 96.6, platelets 144 Hemoglobin A1c 5.5% Potassium 3.9, sodium 136, glucose 165, BUN 31, creatinine 1.61 Cholesterol 88, triglycerides 99, HDL 32, LDL 37  Allergies   Allergies  Allergen Reactions   Lisinopril Swelling    Tongue swelling    Medications Prior to Visit:   Outpatient Medications Prior to Visit  Medication Sig Dispense Refill   ALPRAZolam (XANAX) 0.5 MG tablet Take 0.5 mg by mouth at bedtime as needed for anxiety.     amLODipine (NORVASC) 5 MG tablet TAKE 1 TABLET BY MOUTH ONCE DAILY 90 tablet 3   FLUoxetine (PROZAC) 40 MG capsule Take 40 mg by mouth daily.     folic acid (FOLVITE) 1 MG tablet Take 1 tablet (1 mg total) by mouth daily.     furosemide (LASIX) 40 MG tablet Take 80 mg by mouth 2 (two) times daily.      loratadine (CLARITIN) 10 MG tablet TAKE 1 TABLET BY MOUTH EVERY DAY 30 tablet 0   metFORMIN (GLUCOPHAGE) 500 MG tablet Take 1 tablet (500 mg total) by mouth 2 (two) times daily with a meal. (Patient taking differently: Take 500 mg by mouth daily with breakfast.)     pantoprazole (PROTONIX) 40 MG tablet Take 1 tablet (40 mg total) by mouth daily. 60 tablet 2   potassium chloride SA (K-DUR,KLOR-CON) 20 MEQ tablet Take 1 tablet (20 mEq total) by mouth daily. 60 tablet 3   pravastatin (PRAVACHOL) 20 MG tablet Take 1 tablet (20 mg total) by mouth daily after supper.     Semaglutide,0.25 or 0.'5MG'$ /DOS, (OZEMPIC, 0.25 OR 0.5 MG/DOSE,) 2 MG/1.5ML SOPN Inject 0.5 mg into the skin once a week.     sildenafil (VIAGRA) 100 MG tablet Take 100 mg by mouth daily as needed for erectile dysfunction.     valsartan-hydrochlorothiazide (DIOVAN-HCT) 320-25 MG tablet TAKE 1 TABLET BY MOUTH  DAILY 90 tablet 3   warfarin (COUMADIN) 1 MG tablet Take 1 mg by mouth daily. Monday and Friday 0.5 mg daily and all other days 1 mg by mouth daily.     FLUoxetine (PROZAC) 20 MG capsule Take 20 mg by mouth daily.     No  facility-administered medications prior to visit.   Final Medications at End of Visit    Current Meds  Medication Sig   ALPRAZolam (XANAX) 0.5 MG tablet Take 0.5 mg by mouth at bedtime as needed for anxiety.   amLODipine (NORVASC) 5 MG tablet TAKE 1 TABLET BY MOUTH ONCE DAILY   FLUoxetine (PROZAC) 40 MG capsule Take 40 mg by mouth daily.   folic acid (FOLVITE) 1 MG tablet Take 1 tablet (1 mg total) by mouth daily.   furosemide (LASIX) 40 MG tablet Take 80 mg by mouth 2 (two) times daily.    loratadine (CLARITIN) 10 MG tablet TAKE 1 TABLET BY MOUTH EVERY DAY   metFORMIN (GLUCOPHAGE) 500 MG tablet Take 1 tablet (500 mg total) by mouth 2 (two) times daily with a meal. (Patient taking differently: Take 500 mg by mouth daily with breakfast.)   pantoprazole (PROTONIX) 40 MG tablet Take 1 tablet (40 mg total) by  mouth daily.   potassium chloride SA (K-DUR,KLOR-CON) 20 MEQ tablet Take 1 tablet (20 mEq total) by mouth daily.   pravastatin (PRAVACHOL) 20 MG tablet Take 1 tablet (20 mg total) by mouth daily after supper.   Semaglutide,0.25 or 0.'5MG'$ /DOS, (OZEMPIC, 0.25 OR 0.5 MG/DOSE,) 2 MG/1.5ML SOPN Inject 0.5 mg into the skin once a week.   sildenafil (VIAGRA) 100 MG tablet Take 100 mg by mouth daily as needed for erectile dysfunction.   valsartan-hydrochlorothiazide (DIOVAN-HCT) 320-25 MG tablet TAKE 1 TABLET BY MOUTH  DAILY   warfarin (COUMADIN) 1 MG tablet Take 1 mg by mouth daily. Monday and Friday 0.5 mg daily and all other days 1 mg by mouth daily.   Radiology  No results found.  Cardiac Studies:   Lexiscan stress 07/19/12:  Prominent diaphragmatic attenuation. LVEF normal. Low risk stress.    Caotid duplex 05/29/11:  Bilateral carotid tortuosity    Echocardiogram 09/13/2015 :   - Left ventricle: The cavity size was normal. Systolic function was  normal. The estimated ejection fraction was in the range of 60%  to 65%. Wall motion was normal; there were no regional wall  motion  abnormalities. - Right ventricle: Poorly visualized.  Zio Patch Extended out patient EKG monitoring 14 days starting 10/19/2022: Predominant Rhythm :        Atrial fibrillation Min HR: 24 bpm at 5 AM. Max HR 211 bpm at 10:30 PM Atrial arrhythmias:               Persistent atrial fibrillation Atrial fibrillation:                  100% Ventricular arrhythmias:       Occasional PVCs and ventricular bigeminy, ventricular couplets and 1 ventricular triplet, 4 beat and 6 beats of PVC, overall PVC Burden <1% Heart Block:                        None   Symptoms:                           2 symptoms correlated with ventricular bigeminy and ventricular ectopics.   Echocardiogram 11/09/2022:  Study Quality: Technically difficult study.  Low normal LV systolic function with visual EF 50-55%. Left ventricle  cavity is minimally dilated (upper limits). Mild concentric hypertrophy of  the left ventricle. Hypokinetic global wall motion. Unable to evaluate  diastolic function due to atrial fibrillation.  Right atrial cavity is visually dilated.  Right ventricle cavity is visually dilated and grossly systolic function  is reduced.  Moderate tricuspid regurgitation. Mild pulmonary hypertension. RVSP  measures 40 mmHg.  The aortic root is normal. Mildly dilated proximal ascending aorta., 38 mm  IVC is dilated with respiratory variation, estimated RAP 1.  Prior study 09/13/2015 LVEF 60-65% and right ventricle poorly visualized.    EKG  EKG 10/19/2022: Junctional escape rhythm with ventricular bigeminy.  Poor R wave progression and low voltage complexes, pulmonary disease pattern.  Diffuse nonspecific T wave abnormality.  Compared to previous EKG on 10/19/2021, junctional escape rhythm with ventricular bigeminy is now present.  10/19/2021: Atrial fibrillation with controlled ventricular response at a rate of 52 bpm.  Normal axis.  Incomplete right bundle branch block.  Poor R wave progression, cannot  exclude anteroseptal infarct old.  Low voltage complexes.  Compared EKG 10/19/2020, no significant change.  Consider pulmonary disease pattern.  Assessment     ICD-10-CM  1. Permanent atrial fibrillation (HCC)  I48.21     2. Essential hypertension  I10     3. OSA (obstructive sleep apnea)  G47.33        No orders of the defined types were placed in this encounter.  Medications Discontinued During This Encounter  Medication Reason   FLUoxetine (PROZAC) 20 MG capsule Dose change     Recommendations:   Akiem Urieta is a 82 y.o. male with  morbid obesity, OSA on CPAP, hypertension, chronic leg edema, diabetes mellitus, and permanent atrial fibrillation on Coumadin, which is managed by his PCP.   Permanent atrial fibrillation (HCC) Junctional escape rhythm 14-day cardiac event monitor negative for heart block or arrhythmias. Echocardiogram shows low normal EF 50-55% He continues to tolerate warfarin without bleeding diathesis.  INR currently managed by PCP.   Essential hypertension Blood pressure acceptable range. Continue current medications without changes. Discussed importance of dietary changes including low-sodium diet, exercise as tolerated, and weight loss.   OSA (obstructive sleep apnea) He reports compliance with CPAP.  Follow-up in 6 months or sooner if needed   Edwin Flock, DO, [facility] Office: [phone] Pager: [phone]

## 2022-12-11 NOTE — Addendum Note (Signed)
Addended by: Trudie Buckler on: 12/11/2022 09:16 AM   Modules accepted: Orders

## 2022-12-14 NOTE — Progress Notes (Signed)
New, Willodean Rosenthal, RN; Alonna Minium; Minus Liberty; Nash Shearer Received, Thank you!     Previous Messages    ----- Message ----- From: Brandon Melnick, RN Sent: 12/13/2022   4:46 PM EST To: Darlina Guys; Miquel Dunn; Nash Shearer; * Subject: new machine resmed CPAP                        New order in EPIC for pt  Edwin Odea "[name]" Male, 82 y.o., 01-16-1941 MRN: [id number]   [place]

## 2023-03-10 ENCOUNTER — Other Ambulatory Visit: Payer: Self-pay | Admitting: Cardiology

## 2023-03-21 ENCOUNTER — Encounter: Payer: Self-pay | Admitting: *Deleted

## 2023-03-21 NOTE — Progress Notes (Unsigned)
PATIENT: Edwin [name] DOB: May 23, 1941  REASON FOR VISIT: follow up HISTORY FROM: patient PRIMARY NEUROLOGIST: Dr. Frances Furbish  Virtual Visit via Video Note  I connected with Edwin [name] on 03/22/23 at 11:30 AM EDT by a video enabled telemedicine application located remotely at [facility] and verified that I am speaking with the correct person using two identifiers who was located at their own home.   I discussed the limitations of evaluation and management by telemedicine and the availability of in person appointments. The patient expressed understanding and agreed to proceed.   PATIENT: Edwin [name] DOB: 10-18-1941  REASON FOR VISIT: follow up HISTORY FROM: patient  HISTORY OF PRESENT ILLNESS: Today 03/22/23:  Edwin [name] is a 82 y.o. male with a history of OSA on CPAP. Returns today for follow-up.  He reports that the CPAP is working well.  He does state that his straps used to cut into the back of his head.  He has been putting a washcloth under the strap.  His download is below     11/07/22: Mr. Cirilo is an 82 year old male with a history of obstructive sleep apnea on CPAP.  His download is below. Has repeat in lab testing next month. Tried HST twice but no data. After in lab study will order new machine pending results.      REVIEW OF SYSTEMS: Out of a complete 14 system review of symptoms, the patient complains only of the following symptoms, and all other reviewed systems are negative.  ALLERGIES: Allergies  Allergen Reactions   Lisinopril Swelling    Tongue swelling    HOME MEDICATIONS: Outpatient Medications Prior to Visit  Medication Sig Dispense Refill   ALPRAZolam (XANAX) 0.5 MG tablet Take 0.5 mg by mouth at bedtime as needed for anxiety.     amLODipine (NORVASC) 5 MG tablet TAKE 1 TABLET BY MOUTH ONCE DAILY 90 tablet 3   FLUoxetine (PROZAC) 40 MG capsule Take 40 mg by mouth daily.     folic acid (FOLVITE) 1 MG tablet Take 1 tablet  (1 mg total) by mouth daily.     furosemide (LASIX) 40 MG tablet Take 80 mg by mouth 2 (two) times daily.      loratadine (CLARITIN) 10 MG tablet TAKE 1 TABLET BY MOUTH EVERY DAY 30 tablet 0   metFORMIN (GLUCOPHAGE) 500 MG tablet Take 1 tablet (500 mg total) by mouth 2 (two) times daily with a meal. (Patient taking differently: Take 500 mg by mouth daily with breakfast.)     pantoprazole (PROTONIX) 40 MG tablet Take 1 tablet (40 mg total) by mouth daily. 60 tablet 2   potassium chloride SA (K-DUR,KLOR-CON) 20 MEQ tablet Take 1 tablet (20 mEq total) by mouth daily. 60 tablet 3   pravastatin (PRAVACHOL) 20 MG tablet Take 1 tablet (20 mg total) by mouth daily after supper.     Semaglutide,0.25 or 0.5MG /DOS, (OZEMPIC, 0.25 OR 0.5 MG/DOSE,) 2 MG/1.5ML SOPN Inject 0.5 mg into the skin once a week.     sildenafil (VIAGRA) 100 MG tablet Take 100 mg by mouth daily as needed for erectile dysfunction.     valsartan-hydrochlorothiazide (DIOVAN-HCT) 320-25 MG tablet TAKE 1 TABLET BY MOUTH DAILY 90 tablet 3   warfarin (COUMADIN) 1 MG tablet Take 1 mg by mouth daily. Monday and Friday 0.5 mg daily and all other days 1 mg by mouth daily.     No facility-administered medications prior to visit.    PAST MEDICAL HISTORY: Past Medical History:  Diagnosis Date   Adynamic ileus (HCC)    Anxiety    Arthritis    Atrial fibrillation (HCC) CARDIOLOGIST-- DR Jacinto Halim   Cholelithiasis    Chronic right-sided CHF (congestive heart failure) (HCC)    Colon polyps    TUBULAR ADENOMA (2) & HYPERPLASTIC POLYP   Cor pulmonale, chronic (HCC)    Diabetes mellitus    NIDDM   Gastric ulcer 07/28/2014   GERD (gastroesophageal reflux disease)    GI bleed    Hypertension    benign essential hypertension   Long-term (current) use of anticoagulants    Morbid obesity (HCC)    OSA (obstructive sleep apnea) 08/29/2013   OSA (obstructive sleep apnea)    Pulmonary hypertension (HCC) MODERATE   Pure hypercholesterolemia     Shortness of breath    with exertion    Sleep apnea    no CPAP   Varicose veins of legs     PAST SURGICAL HISTORY: Past Surgical History:  Procedure Laterality Date   benign mole removal from nose      CARDIOVASCULAR STRESS TEST  07-19-2012  DR Jacinto Halim   PROMINENT DIAPHRAGMATIC ATTENUATION/ NORMAL LVEF/ LOW RISK STUDY   COLONOSCOPY     ESOPHAGOGASTRODUODENOSCOPY (EGD) WITH PROPOFOL N/A 07/28/2014   Procedure: ESOPHAGOGASTRODUODENOSCOPY (EGD) WITH PROPOFOL;  Surgeon: Hilarie Fredrickson, MD;  Location: [facility];  Service: Endoscopy;  Laterality: N/A;   KNEE ARTHROSCOPY WITH MEDIAL MENISECTOMY Left 04/23/2013   Procedure: LEFT KNEE ARTHROSCOPY WITH PARTIAL MEDIAL MENISECTOMY;  Surgeon: Drucilla Schmidt, MD;  Location: [facility];  Service: Orthopedics;  Laterality: Left;   MOUTH SURGERY     TRANSTHORACIC ECHOCARDIOGRAM  05-16-2012   LOW NORMAL LVEF/ MOD. RV/ MILD HYPOKINESIS/ MOD. PULMONARY HTN/ CHRONIC COR PUMONALE/ NO SIG. CHANGE FROM 12-01-2010    FAMILY HISTORY: Family History  Problem Relation Age of Onset   Heart disease Mother    Hypertension Father    Cancer Father    Throat cancer Paternal Uncle    Liver cancer Son    Colon cancer Neg Hx    Prostate cancer Neg Hx    Rectal cancer Neg Hx    Stomach cancer Neg Hx     SOCIAL HISTORY: Social History   Socioeconomic History   Marital status: Widowed    Spouse name: Not on file   Number of children: 4   Years of education: Not on file   Highest education level: Not on file  Occupational History   Occupation: retired  Tobacco Use   Smoking status: Former    Packs/day: 1.00    Years: 20.00    Additional pack years: 0.00    Total pack years: 20.00    Types: Cigarettes    Quit date: 06/27/1985    Years since quitting: 37.7   Smokeless tobacco: Never  Substance and Sexual Activity   Alcohol use: Yes    Alcohol/week: 0.0 standard drinks of alcohol    Comment: beer 3-4 daily   Drug use: No   Sexual activity: Not on file   Other Topics Concern   Not on file  Social History Narrative   Widowed and retired   No tobacco, + ETOH no drugs   Social Determinants of Corporate investment banker Strain: Not on file  Food Insecurity: Not on file  Transportation Needs: Not on file  Physical Activity: Not on file  Stress: Not on file  Social Connections: Not on file  Intimate Partner Violence: Not on file  PHYSICAL EXAM Generalized: Well developed, in no acute distress   Neurological examination  Mentation: Alert oriented to time, place, history taking. Follows all commands speech and language fluent Cranial nerve II-XII: Facial symmetry noted.    DIAGNOSTIC DATA (LABS, IMAGING, TESTING) - I reviewed patient records, labs, notes, testing and imaging myself where available.  Lab Results  Component Value Date   WBC 15.6 (H) 09/15/2015   HGB 8.6 (L) 09/15/2015   HCT 25.5 (L) 09/15/2015   MCV 95.9 09/15/2015   PLT 162 09/15/2015      Component Value Date/Time   NA 137 09/16/2015 0319   K 4.5 09/16/2015 0319   CL 106 09/16/2015 0319   CO2 26 09/16/2015 0319   GLUCOSE 109 (H) 09/16/2015 0319   BUN 11 09/16/2015 0319   CREATININE 0.97 09/16/2015 0319   CALCIUM 8.5 (L) 09/16/2015 0319   PROT 5.2 (L) 09/15/2015 0312   ALBUMIN 2.7 (L) 09/15/2015 0312   AST 53 (H) 09/15/2015 0312   ALT 61 09/15/2015 0312   ALKPHOS 56 09/15/2015 0312   BILITOT 1.8 (H) 09/15/2015 0312   GFRNONAA >60 09/16/2015 0319   GFRAA >60 09/16/2015 0319   Lab Results  Component Value Date   HGBA1C 5.6 09/12/2015    Lab Results  Component Value Date   TSH 1.950 07/26/2014      ASSESSMENT AND PLAN 82 y.o. year old male  has a past medical history of Adynamic ileus (HCC), Anxiety, Arthritis, Atrial fibrillation (HCC) (CARDIOLOGIST-- DR Jacinto Halim), Cholelithiasis, Chronic right-sided CHF (congestive heart failure) (HCC), Colon polyps, Cor pulmonale, chronic (HCC), Diabetes mellitus, Gastric ulcer (07/28/2014), GERD  (gastroesophageal reflux disease), GI bleed, Hypertension, Long-term (current) use of anticoagulants, Morbid obesity (HCC), OSA (obstructive sleep apnea) (08/29/2013), OSA (obstructive sleep apnea), Pulmonary hypertension (HCC) (MODERATE), Pure hypercholesterolemia, Shortness of breath, Sleep apnea, and Varicose veins of legs. here with:  OSA on CPAP  CPAP compliance excellent Residual AHI is good Encouraged patient to continue using CPAP nightly and > 4 hours each night Order sent for mask refitting due to straps F/U in 1 year or sooner if needed    Butch Penny, MSN, NP-C 03/22/2023, 12:48 PM [facility] [address] [place], [place] [address] [phone]

## 2023-03-22 ENCOUNTER — Telehealth (INDEPENDENT_AMBULATORY_CARE_PROVIDER_SITE_OTHER): Payer: MEDICARE | Admitting: Adult Health

## 2023-03-22 DIAGNOSIS — G4733 Obstructive sleep apnea (adult) (pediatric): Secondary | ICD-10-CM | POA: Diagnosis not present

## 2023-05-30 ENCOUNTER — Ambulatory Visit: Payer: MEDICARE | Admitting: Cardiology

## 2023-06-12 ENCOUNTER — Encounter: Payer: Self-pay | Admitting: Cardiology

## 2023-06-12 ENCOUNTER — Ambulatory Visit: Payer: MEDICARE | Admitting: Cardiology

## 2023-06-12 VITALS — BP 123/44 | HR 48 | Resp 16 | Ht 71.0 in | Wt 293.0 lb

## 2023-06-12 DIAGNOSIS — I10 Essential (primary) hypertension: Secondary | ICD-10-CM

## 2023-06-12 DIAGNOSIS — I2781 Cor pulmonale (chronic): Secondary | ICD-10-CM

## 2023-06-12 DIAGNOSIS — I4821 Permanent atrial fibrillation: Secondary | ICD-10-CM

## 2023-06-12 NOTE — Progress Notes (Signed)
Primary Physician/Referring:  Tracey Harries, MD  Patient ID: Edwin [name], male    DOB: May 19, 1941, 82 y.o.   MRN: [id number]  Chief Complaint  Patient presents with   Atrial Fibrillation   Follow-up    6 month    HPI: Edwin [name]  is a 82 y.o. male  male with  morbid obesity, OSA on CPAP, hypertension, chronic leg edema, diabetes mellitus, and permanent atrial fibrillation on Coumadin, which is managed by his PCP, chronic cor pulmonale presents here for 75-month office visit.   He remains compliant with CPAP.  Denies chest pain, palpitations, syncope, near syncope.  Denies orthopnea, PND, symptoms suggestive of TIA or CVA.  Patient follows with PCP for management of INR on Coumadin.  He is tolerating anticoagulation without bleeding diathesis.  He was started on Ozempic with 40 pound weight loss since December 2023.  Since then he has noticed marked improvement in overall wellness and also dyspnea.  Leg edema has also improved.  Past Medical History:  Diagnosis Date   Adynamic ileus (HCC)    Anxiety    Arthritis    Atrial fibrillation (HCC) CARDIOLOGIST-- DR Jacinto Halim   Cholelithiasis    Chronic right-sided CHF (congestive heart failure) (HCC)    Colon polyps    TUBULAR ADENOMA (2) & HYPERPLASTIC POLYP   Cor pulmonale, chronic (HCC)    Diabetes mellitus    NIDDM   Gastric ulcer 07/28/2014   GERD (gastroesophageal reflux disease)    GI bleed    Hypertension    benign essential hypertension   Long-term (current) use of anticoagulants    Morbid obesity (HCC)    OSA (obstructive sleep apnea) 08/29/2013   OSA (obstructive sleep apnea)    Pulmonary hypertension (HCC) MODERATE   Pure hypercholesterolemia    Shortness of breath    with exertion    Sleep apnea    no CPAP   Varicose veins of legs    Family History  Problem Relation Age of Onset   Heart disease Mother    Hypertension Father    Cancer Father    Throat cancer Paternal Uncle    Liver cancer Son    Colon cancer Neg  Hx    Prostate cancer Neg Hx    Rectal cancer Neg Hx    Stomach cancer Neg Hx    Past Surgical History:  Procedure Laterality Date   benign mole removal from nose      CARDIOVASCULAR STRESS TEST  07-19-2012  DR Jacinto Halim   PROMINENT DIAPHRAGMATIC ATTENUATION/ NORMAL LVEF/ LOW RISK STUDY   COLONOSCOPY     ESOPHAGOGASTRODUODENOSCOPY (EGD) WITH PROPOFOL N/A 07/28/2014   Procedure: ESOPHAGOGASTRODUODENOSCOPY (EGD) WITH PROPOFOL;  Surgeon: Hilarie Fredrickson, MD;  Location: [facility];  Service: Endoscopy;  Laterality: N/A;   KNEE ARTHROSCOPY WITH MEDIAL MENISECTOMY Left 04/23/2013   Procedure: LEFT KNEE ARTHROSCOPY WITH PARTIAL MEDIAL MENISECTOMY;  Surgeon: Drucilla Schmidt, MD;  Location: [facility];  Service: Orthopedics;  Laterality: Left;   MOUTH SURGERY     TRANSTHORACIC ECHOCARDIOGRAM  05-16-2012   LOW NORMAL LVEF/ MOD. RV/ MILD HYPOKINESIS/ MOD. PULMONARY HTN/ CHRONIC COR PUMONALE/ NO SIG. CHANGE FROM 12-01-2010   Social History   Tobacco Use   Smoking status: Former    Current packs/day: 0.00    Average packs/day: 1 pack/day for 20.0 years (20.0 ttl pk-yrs)    Types: Cigarettes    Start date: 06/27/1965    Quit date: 06/27/1985    Years since quitting: 37.9  Smokeless tobacco: Never  Substance Use Topics   Alcohol use: Yes    Alcohol/week: 0.0 standard drinks of alcohol    Comment: beer 3-4 daily   Marital Status: Widowed  ROS  Review of Systems  Constitutional: Positive for weight gain. Negative for weight loss (=).  Cardiovascular:  Positive for dyspnea on exertion (stable, chronic) and leg swelling (improved). Negative for syncope.  Psychiatric/Behavioral:  Positive for depression (stable).   All other systems reviewed and are negative.  Objective  Blood pressure (!) 123/44, pulse (!) 48, resp. rate 16, height 5\' 11"  (1.803 m), weight 293 lb (132.9 kg), SpO2 96%. Body mass index is 40.87 kg/m.     06/12/2023    2:38 PM 11/29/2022    1:45 PM 10/19/2022    1:52 PM  Vitals with  BMI  Height 5\' 11"  5\' 11"  5\' 11"   Weight 293 lbs 311 lbs 3 oz 329 lbs 6 oz  BMI 40.88 43.42 45.96  Systolic 123 130 643  Diastolic 44 69 51  Pulse 48 55 39   Physical Exam Constitutional:      Appearance: He is morbidly obese.  Neck:     Thyroid: No thyromegaly.     Vascular: No carotid bruit or JVD.  Cardiovascular:     Rate and Rhythm: Bradycardia present. Rhythm irregular.     Pulses:          Dorsalis pedis pulses are 0 on the right side and 0 on the left side.       Posterior tibial pulses are 0 on the right side and 0 on the left side.     Heart sounds: Murmur heard.     Midsystolic murmur is present at the upper right sternal border.     No gallop. No S3 or S4 sounds.  Pulmonary:     Effort: Pulmonary effort is normal. No respiratory distress.     Breath sounds: Normal breath sounds. No wheezing or rales.  Abdominal:     General: Bowel sounds are normal. There is distension.  Musculoskeletal:     Right lower leg: Edema (3+ pitting edema. Chronic venous stasis dermatitis noted) present.     Left lower leg: Edema (2+ pitting edema.  Chronic venous stasis dermatitis noted) present.  Skin:    General: Skin is warm and dry.    Laboratory examination:   External labs:   Labs 02/05/2023:  Serum glucose 140 mg, BUN 23, creatinine 1.37, EGFR 50 to mL, potassium 3.9.  LFTs normal.  Total cholesterol 83, triglycerides 58, HDL 39, LDL 30.  A1c 4.9%.  Hb 10.5/HCT 31.0, platelets 11/29/1975.  Microcytic indicis.  Radiology  No results found.  Cardiac Studies:   Lexiscan stress 07/19/12:  Prominent diaphragmatic attenuation. LVEF normal. Low risk stress.  Carotid artery duplex 10/29/2020:  Stenosis in the right external carotid artery (<50%). Minimal stenosis in the left internal carotid artery (1-15%). Stenosis in the left external carotid artery (<50%). Antegrade right vertebral artery flow. Antegrade left vertebral artery flow. Follow up studies is appropriate when  clinically indicated. External carotid stenosis is the source of bruit. NO significant change from 05/29/2011.    Zio Patch Extended out patient EKG monitoring 14 days starting 10/19/2022: Predominant Rhythm :        Atrial fibrillation Min HR: 24 bpm at 5 AM. Max HR 211 bpm at 10:30 PM Atrial arrhythmias:               Persistent atrial fibrillation Atrial fibrillation:  100% Ventricular arrhythmias:       Occasional PVCs and ventricular bigeminy, ventricular couplets and 1 ventricular triplet, 4 beat and 6 beats of PVC, overall PVC Burden <1% Heart Block:                        None   Symptoms:                           2 symptoms correlated with ventricular bigeminy and ventricular ectopics.   Echocardiogram 11/09/2022: Study Quality: Technically difficult study. Low normal LV systolic function with visual EF 50-55%. Left ventricle cavity is minimally dilated (upper limits). Mild concentric hypertrophy of the left ventricle. Hypokinetic global wall motion. Unable to evaluate diastolic function due to atrial fibrillation.  Right atrial cavity is visually dilated. Right ventricle cavity is visually dilated and grossly systolic function is reduced. Moderate tricuspid regurgitation. Mild pulmonary hypertension. RVSP measures 40 mmHg. The aortic root is normal. Mildly dilated proximal ascending aorta., 38 mm IVC is dilated with respiratory variation, estimated RAP 1. Prior study 09/13/2015 LVEF 60-65% and right ventricle poorly visualized.   EKG   EKG 06/12/2023: Atrial fibrillation with controlled ventricular sponsor at rate of 44 bpm, incomplete right bundle branch block.  Anteroseptal infarct old.  Diffuse nonspecific T abnormality.  Low-voltage complexes.  Consider pulmonary disease pattern.  Compared to 10/19/2022, junctional escape rhythm with frequent PVCs in bigeminal pattern not present.  EKG 10/19/2022: Junctional escape rhythm with ventricular bigeminy.  Poor R  wave progression and low voltage complexes, pulmonary disease pattern.  Diffuse nonspecific T wave abnormality.  Compared to previous EKG on 10/19/2021, junctional escape rhythm with ventricular bigeminy is now present.  Allergies   Allergies  Allergen Reactions   Lisinopril Swelling    Tongue swelling    Current Outpatient Medications:    ALPRAZolam (XANAX) 0.5 MG tablet, Take 0.5 mg by mouth at bedtime as needed for anxiety., Disp: , Rfl:    amLODipine (NORVASC) 5 MG tablet, TAKE 1 TABLET BY MOUTH ONCE DAILY, Disp: 90 tablet, Rfl: 3   FLUoxetine (PROZAC) 40 MG capsule, Take 40 mg by mouth daily., Disp: , Rfl:    folic acid (FOLVITE) 1 MG tablet, Take 1 tablet (1 mg total) by mouth daily., Disp: , Rfl:    furosemide (LASIX) 40 MG tablet, Take 80 mg by mouth 2 (two) times daily. , Disp: , Rfl:    loratadine (CLARITIN) 10 MG tablet, TAKE 1 TABLET BY MOUTH EVERY DAY, Disp: 30 tablet, Rfl: 0   metFORMIN (GLUCOPHAGE) 500 MG tablet, Take 1 tablet (500 mg total) by mouth 2 (two) times daily with a meal. (Patient taking differently: Take 500 mg by mouth daily with breakfast.), Disp: , Rfl:    pantoprazole (PROTONIX) 40 MG tablet, Take 1 tablet (40 mg total) by mouth daily., Disp: 60 tablet, Rfl: 2   potassium chloride SA (K-DUR,KLOR-CON) 20 MEQ tablet, Take 1 tablet (20 mEq total) by mouth daily., Disp: 60 tablet, Rfl: 3   pravastatin (PRAVACHOL) 20 MG tablet, Take 1 tablet (20 mg total) by mouth daily after supper., Disp: , Rfl:    Semaglutide,0.25 or 0.5MG /DOS, (OZEMPIC, 0.25 OR 0.5 MG/DOSE,) 2 MG/1.5ML SOPN, Inject 0.5 mg into the skin once a week., Disp: , Rfl:    sildenafil (VIAGRA) 100 MG tablet, Take 100 mg by mouth daily as needed for erectile dysfunction., Disp: , Rfl:    valsartan-hydrochlorothiazide (DIOVAN-HCT) 320-25 MG  tablet, TAKE 1 TABLET BY MOUTH DAILY, Disp: 90 tablet, Rfl: 3   warfarin (COUMADIN) 1 MG tablet, Take 1 mg by mouth daily. Monday and Friday 0.5 mg daily and all other  days 1 mg by mouth daily., Disp: , Rfl:    Assessment     ICD-10-CM   1. Permanent atrial fibrillation (HCC)  I48.21 EKG 12-Lead    2. Essential hypertension  I10     3. Chronic cor pulmonale (HCC)  I27.81       No orders of the defined types were placed in this encounter.  There are no discontinued medications.  Recommendations:   Edwin [name] is a 82 y.o. male with  morbid obesity, OSA on CPAP, hypertension, chronic leg edema, diabetes mellitus, and permanent atrial fibrillation on Coumadin, which is managed by his PCP, chronic cor pulmonale presents here for 8-month office visit.   1. Permanent atrial fibrillation (HCC) On his last office visit patient was in asymptomatic junctional escape rhythm with frequent PVCs.  Today he is back in atrial fibrillation with controlled ventricular response, he has got chronic bradycardia but essentially remains asymptomatic.  Since being on Ozempic, he has lost about 40 pounds in weight since December 2023.  I have encouraged him to continue to lose more weight, overall he is doing well from a cardiac standpoint.  - EKG 12-Lead  2. Essential hypertension Blood pressure is well-controlled.  I reviewed his external labs, normal renal function.  Blood counts reveal chronic anemia and macrocytic indicis, this could be contributed by his alcohol history in the past he used to drink heavily in the past, now he is drinking about 3-4 light beers in a day.  He has not had B12 and folate checked in quite a while, he is seeing Dr. Everlene Other, in September 2024, will request him to check B12 and folate.  3. 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  He has been compliant with his CPAP.  Overall doing well and stable from cardiac standpoint, his activity level, dyspnea and overall feeling of wellness is improved since weight loss of 40 pounds since he was started on Ozempic.  I will  see him back on annual basis.   Edwin Decamp, MD, [facility] 06/12/2023, 3:25 PM Office: [phone] Fax: [phone] Pager: [phone]

## 2024-01-02 ENCOUNTER — Other Ambulatory Visit: Payer: Self-pay | Admitting: Cardiology

## 2024-03-17 NOTE — Progress Notes (Unsigned)
 Edwin [name]

## 2024-03-18 ENCOUNTER — Telehealth: Payer: MEDICARE | Admitting: Adult Health

## 2024-03-18 DIAGNOSIS — G4733 Obstructive sleep apnea (adult) (pediatric): Secondary | ICD-10-CM | POA: Diagnosis not present

## 2024-03-18 NOTE — Patient Instructions (Signed)
 Continue using CPAP nightly and greater than 4 hours each night If your symptoms worsen or you develop new symptoms please let us know.

## 2024-03-18 NOTE — Progress Notes (Signed)
 PATIENT: Edwin [name] DOB: 1940/11/30  REASON FOR VISIT: follow up HISTORY FROM: patient PRIMARY NEUROLOGIST: Dr. Omar Bibber  Virtual Visit via Video Note  I connected with Edwin [name] on 03/18/24 at  2:30 PM EDT by a video enabled telemedicine application located remotely at [facility] and verified that I am speaking with the correct person using two identifiers who was located at their own home.   I discussed the limitations of evaluation and management by telemedicine and the availability of in person appointments. The patient expressed understanding and agreed to proceed.   PATIENT: Edwin [name] DOB: June 18, 1941  REASON FOR VISIT: follow up HISTORY FROM: patient  HISTORY OF PRESENT ILLNESS: Today 03/18/24:  Edwin [name] is a 83 y.o. male with a history of OSA on CPAP. Returns today for follow-up.  Reports that CPAP is working well.  He states that he has done mask refitting's in the past but his original mask seems to work best for him.  He does feel that the CPAP is beneficial.  His download is below     03/22/23: Edwin [name] is a 83 y.o. male with a history of OSA on CPAP. Returns today for follow-up.  He reports that the CPAP is working well.  He does state that his straps used to cut into the back of his head.  He has been putting a washcloth under the strap.  His download is below     11/07/22: Edwin [name] is an 83 year old male with a history of obstructive sleep apnea on CPAP.  His download is below. Has repeat in lab testing next month. Tried HST twice but no data. After in lab study will order new machine pending results.      REVIEW OF SYSTEMS: Out of a complete 14 system review of symptoms, the patient complains only of the following symptoms, and all other reviewed systems are negative.  ALLERGIES: Allergies  Allergen Reactions   Lisinopril Swelling    Tongue swelling    HOME MEDICATIONS: Outpatient Medications Prior to Visit   Medication Sig Dispense Refill   ALPRAZolam  (XANAX ) 0.5 MG tablet Take 0.5 mg by mouth at bedtime as needed for anxiety.     amLODipine  (NORVASC ) 5 MG tablet TAKE 1 TABLET BY MOUTH ONCE DAILY 90 tablet 3   FLUoxetine  (PROZAC ) 40 MG capsule Take 40 mg by mouth daily.     folic acid  (FOLVITE ) 1 MG tablet Take 1 tablet (1 mg total) by mouth daily.     furosemide  (LASIX ) 40 MG tablet Take 80 mg by mouth 2 (two) times daily.      loratadine (CLARITIN) 10 MG tablet TAKE 1 TABLET BY MOUTH EVERY DAY 30 tablet 0   metFORMIN  (GLUCOPHAGE ) 500 MG tablet Take 1 tablet (500 mg total) by mouth 2 (two) times daily with a meal. (Patient taking differently: Take 500 mg by mouth daily with breakfast.)     pantoprazole  (PROTONIX ) 40 MG tablet Take 1 tablet (40 mg total) by mouth daily. 60 tablet 2   potassium chloride  SA (K-DUR,KLOR-CON ) 20 MEQ tablet Take 1 tablet (20 mEq total) by mouth daily. 60 tablet 3   pravastatin  (PRAVACHOL ) 20 MG tablet Take 1 tablet (20 mg total) by mouth daily after supper.     Semaglutide,0.25 or 0.5MG /DOS, (OZEMPIC, 0.25 OR 0.5 MG/DOSE,) 2 MG/1.5ML SOPN Inject 0.5 mg into the skin once a week.     sildenafil (VIAGRA) 100 MG tablet Take 100 mg by mouth daily as needed for  erectile dysfunction.     valsartan -hydrochlorothiazide  (DIOVAN -HCT) 320-25 MG tablet TAKE 1 TABLET BY MOUTH DAILY 90 tablet 1   warfarin (COUMADIN ) 1 MG tablet Take 1 mg by mouth daily. Monday and Friday 0.5 mg daily and all other days 1 mg by mouth daily.     No facility-administered medications prior to visit.    PAST MEDICAL HISTORY: Past Medical History:  Diagnosis Date   Adynamic ileus (HCC)    Anxiety    Arthritis    Atrial fibrillation (HCC) CARDIOLOGIST-- DR Berry Bristol   Cholelithiasis    Chronic right-sided CHF (congestive heart failure) (HCC)    Colon polyps    TUBULAR ADENOMA (2) & HYPERPLASTIC POLYP   Cor pulmonale, chronic (HCC)    Diabetes mellitus    NIDDM   Gastric ulcer 07/28/2014   GERD  (gastroesophageal reflux disease)    GI bleed    Hypertension    benign essential hypertension   Long-term (current) use of anticoagulants    Morbid obesity (HCC)    OSA (obstructive sleep apnea) 08/29/2013   OSA (obstructive sleep apnea)    Pulmonary hypertension (HCC) MODERATE   Pure hypercholesterolemia    Shortness of breath    with exertion    Sleep apnea    no CPAP   Varicose veins of legs     PAST SURGICAL HISTORY: Past Surgical History:  Procedure Laterality Date   benign mole removal from nose      CARDIOVASCULAR STRESS TEST  07-19-2012  DR Berry Bristol   PROMINENT DIAPHRAGMATIC ATTENUATION/ NORMAL LVEF/ LOW RISK STUDY   COLONOSCOPY     ESOPHAGOGASTRODUODENOSCOPY (EGD) WITH PROPOFOL  N/A 07/28/2014   Procedure: ESOPHAGOGASTRODUODENOSCOPY (EGD) WITH PROPOFOL ;  Surgeon: Tobin Forts, MD;  Location: [facility];  Service: Endoscopy;  Laterality: N/A;   KNEE ARTHROSCOPY WITH MEDIAL MENISECTOMY Left 04/23/2013   Procedure: LEFT KNEE ARTHROSCOPY WITH PARTIAL MEDIAL MENISECTOMY;  Surgeon: Verlinda Gloss, MD;  Location: [facility];  Service: Orthopedics;  Laterality: Left;   MOUTH SURGERY     TRANSTHORACIC ECHOCARDIOGRAM  05-16-2012   LOW NORMAL LVEF/ MOD. RV/ MILD HYPOKINESIS/ MOD. PULMONARY HTN/ CHRONIC COR PUMONALE/ NO SIG. CHANGE FROM 12-01-2010    FAMILY HISTORY: Family History  Problem Relation Age of Onset   Heart disease Mother    Hypertension Father    Cancer Father    Throat cancer Paternal Uncle    Liver cancer Son    Colon cancer Neg Hx    Prostate cancer Neg Hx    Rectal cancer Neg Hx    Stomach cancer Neg Hx     SOCIAL HISTORY: Social History   Socioeconomic History   Marital status: Widowed    Spouse name: Not on file   Number of children: 4   Years of education: Not on file   Highest education level: Not on file  Occupational History   Occupation: retired  Tobacco Use   Smoking status: Former    Current packs/day: 0.00    Average packs/day: 1 pack/day  for 20.0 years (20.0 ttl pk-yrs)    Types: Cigarettes    Start date: 06/27/1965    Quit date: 06/27/1985    Years since quitting: 38.7   Smokeless tobacco: Never  Vaping Use   Vaping status: Never Used  Substance and Sexual Activity   Alcohol use: Yes    Alcohol/week: 0.0 standard drinks of alcohol    Comment: beer 3-4 daily   Drug use: No   Sexual activity: Not on file  Other Topics Concern   Not on file  Social History Narrative   Widowed and retired   No tobacco, + ETOH no drugs   Social Drivers of Corporate investment banker Strain: Low Risk  (02/06/2024)   Received from [facility]   Overall Financial Resource Strain (CARDIA)    Difficulty of Paying Living Expenses: Not very hard  Food Insecurity: No Food Insecurity (02/06/2024)   Received from [facility]   Hunger Vital Sign    Worried About Running Out of Food in the Last Year: Never true    Ran Out of Food in the Last Year: Never true  Transportation Needs: No Transportation Needs (02/06/2024)   Received from [facility] - Transportation    Lack of Transportation (Medical): No    Lack of Transportation (Non-Medical): No  Physical Activity: Insufficiently Active (02/06/2024)   Received from [facility]   Exercise Vital Sign    Days of Exercise per Week: 2 days    Minutes of Exercise per Session: 20 min  Stress: No Stress Concern Present (02/06/2024)   Received from [facility] - Occupational Stress Questionnaire    Feeling of Stress : Not at all  Recent Concern: Stress - Stress Concern Present (12/14/2023)   Received from [facility] - Occupational Stress Questionnaire    Feeling of Stress : To some extent  Social Connections: Socially Integrated (02/06/2024)   Received from [facility]   Social Network    How would you rate your social network (family, work, friends)?: Good participation with social networks   Intimate Partner Violence: Not At Risk (02/06/2024)   Received from [facility]   HITS    Over the last 12 months how often did your partner physically hurt you?: Never    Over the last 12 months how often did your partner insult you or talk down to you?: Never    Over the last 12 months how often did your partner threaten you with physical harm?: Never    Over the last 12 months how often did your partner scream or curse at you?: Never      PHYSICAL EXAM Generalized: Well developed, in no acute distress   Neurological examination  Mentation: Alert oriented to time, place, history taking. Follows all commands speech and language fluent Cranial nerve II-XII: Facial symmetry noted.    DIAGNOSTIC DATA (LABS, IMAGING, TESTING) - I reviewed patient records, labs, notes, testing and imaging myself where available.  Lab Results  Component Value Date   WBC 15.6 (H) 09/15/2015   HGB 8.6 (L) 09/15/2015   HCT 25.5 (L) 09/15/2015   MCV 95.9 09/15/2015   PLT 162 09/15/2015      Component Value Date/Time   NA 137 09/16/2015 0319   K 4.5 09/16/2015 0319   CL 106 09/16/2015 0319   CO2 26 09/16/2015 0319   GLUCOSE 109 (H) 09/16/2015 0319   BUN 11 09/16/2015 0319   CREATININE 0.97 09/16/2015 0319   CALCIUM 8.5 (L) 09/16/2015 0319   PROT 5.2 (L) 09/15/2015 0312   ALBUMIN 2.7 (L) 09/15/2015 0312   AST 53 (H) 09/15/2015 0312   ALT 61 09/15/2015 0312   ALKPHOS 56 09/15/2015 0312   BILITOT 1.8 (H) 09/15/2015 0312   GFRNONAA >60 09/16/2015 0319   GFRAA >60 09/16/2015 0319   Lab Results  Component Value Date   HGBA1C 5.6 09/12/2015  Lab Results  Component Value Date   TSH 1.950 07/26/2014      ASSESSMENT AND PLAN 83 y.o. year old male  has a past medical history of Adynamic ileus (HCC), Anxiety, Arthritis, Atrial fibrillation (HCC) (CARDIOLOGIST-- DR Berry Bristol), Cholelithiasis, Chronic right-sided CHF (congestive heart failure) (HCC), Colon polyps, Cor pulmonale, chronic (HCC),  Diabetes mellitus, Gastric ulcer (07/28/2014), GERD (gastroesophageal reflux disease), GI bleed, Hypertension, Long-term (current) use of anticoagulants, Morbid obesity (HCC), OSA (obstructive sleep apnea) (08/29/2013), OSA (obstructive sleep apnea), Pulmonary hypertension (HCC) (MODERATE), Pure hypercholesterolemia, Shortness of breath, Sleep apnea, and Varicose veins of legs. here with:  OSA on CPAP  CPAP compliance excellent Residual AHI is good Encouraged patient to continue using CPAP nightly and > 4 hours each night F/U in 1 year or sooner if needed    Clem Currier, MSN, NP-C 03/18/2024, 2:23 PM [facility] [address], [address] [place], [place] [address] [phone]

## 2024-03-24 ENCOUNTER — Ambulatory Visit: Payer: MEDICARE | Admitting: Podiatry

## 2024-04-03 ENCOUNTER — Encounter: Payer: Self-pay | Admitting: Podiatry

## 2024-04-03 ENCOUNTER — Ambulatory Visit (INDEPENDENT_AMBULATORY_CARE_PROVIDER_SITE_OTHER): Payer: MEDICARE | Admitting: Podiatry

## 2024-04-03 DIAGNOSIS — E1151 Type 2 diabetes mellitus with diabetic peripheral angiopathy without gangrene: Secondary | ICD-10-CM

## 2024-04-03 DIAGNOSIS — M79671 Pain in right foot: Secondary | ICD-10-CM | POA: Diagnosis not present

## 2024-04-03 DIAGNOSIS — I739 Peripheral vascular disease, unspecified: Secondary | ICD-10-CM

## 2024-04-03 DIAGNOSIS — B351 Tinea unguium: Secondary | ICD-10-CM

## 2024-04-03 DIAGNOSIS — M79672 Pain in left foot: Secondary | ICD-10-CM

## 2024-04-03 NOTE — Progress Notes (Signed)
 Patient presents for evaluation and treatment of tenderness and some redness around nails feet.   Physical exam:  General appearance: Alert, pleasant, and in no acute distress.  Vascular: Pedal pulses: DP diminished bilaterally, PT nonpalpable bilaterally.  Severe edema lower legs bilaterally  Neurological:  No complaint of burning or paresthesias.  Dermatologic:  Nails thickened, disfigured, discolored 1-5 BL with subungual debris.  Some redness along nail folds bilaterally but no signs of drainage or infection.  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 2. Pain toes 1 through 5 bilaterally. 3.  Diabetes mellitus type 2 with PVD 4. PVD  Plan: Debrided onychomycotic nails 1 through 5 bilaterally.  Return 3 months

## 2024-05-31 ENCOUNTER — Other Ambulatory Visit: Payer: Self-pay | Admitting: Cardiology

## 2024-06-11 ENCOUNTER — Ambulatory Visit: Payer: MEDICARE | Admitting: Cardiology

## 2024-06-19 ENCOUNTER — Encounter: Payer: Self-pay | Admitting: Cardiology

## 2024-06-19 ENCOUNTER — Ambulatory Visit: Payer: MEDICARE | Attending: Cardiology | Admitting: Cardiology

## 2024-06-19 VITALS — BP 102/58 | HR 50 | Resp 16 | Ht 71.0 in | Wt 297.1 lb

## 2024-06-19 DIAGNOSIS — R001 Bradycardia, unspecified: Secondary | ICD-10-CM | POA: Diagnosis present

## 2024-06-19 DIAGNOSIS — I4821 Permanent atrial fibrillation: Secondary | ICD-10-CM | POA: Insufficient documentation

## 2024-06-19 DIAGNOSIS — I10 Essential (primary) hypertension: Secondary | ICD-10-CM | POA: Insufficient documentation

## 2024-06-19 DIAGNOSIS — G4733 Obstructive sleep apnea (adult) (pediatric): Secondary | ICD-10-CM | POA: Diagnosis present

## 2024-06-19 NOTE — Progress Notes (Unsigned)
  Cardiology Office Note:  .   Date:  06/19/2024  ID:  Edwin [name], DOB 1941/02/12, MRN [id number] PCP: Pura Lenis, MD  [facility] Providers Cardiologist:  None { Click to update primary MD,subspecialty MD or APP then REFRESH:1}  History of Present Illness: .   Edwin [name] is a 83 y.o.male with OSA on CPAP, hypertension, chronic leg edema, diabetes mellitus, and permanent atrial fibrillation on Coumadin , which is managed by his PCP, chronic cor pulmonale, paroxysmal episodes of junctional escape rhythm noted in 2023 but completely asymptomatic and recommended continued observation, chronic bradycardia and asymptomatic, morbid obesity and has lost significant amount of weight since being on Ozempic a year ago presents for annual visit.  Echocardiogram 11/09/2022: Normal LVEF at 50 to 55% with mild LVH.  RV is mildly dilated with mildly reduced RV function, RV systolic pressure 40 mmHg.  Discussed the use of AI scribe software for clinical note transcription with the patient, who gave verbal consent to proceed.  History of Present Illness   Labs   External Labs:  PCP labs on Care Everywhere 04/29/2024:  Uric acid elevated at 9.3.  Hb 10.5/HCT 32.1, platelets 186, RDW elevated at 17.4.  INR 2.7.  A1c 4.9%.  Labs 02/06/2024:  Total cholesterol 95, triglycerides 87, HDL 39, LDL 39.  BUN 33, creatinine 1.46, EGFR 48 mL, potassium 3.9.  Alkaline phosphatase borderline high at 126 and ALT mildly elevated at 54.  Hb 11.1/HCT 32.6, platelets 275.  RDW elevated at 17.7.  ROS  ***Review of Systems  Cardiovascular:  Positive for dyspnea on exertion (stable) and leg swelling (chronic and improved with weight loss). Negative for chest pain.  Skin:  Positive for dry skin and rash (bilateral legs due to chronic edema).   Physical Exam:   VS:  BP (!) 102/58 (BP Location: Left Arm, Patient Position: Sitting, Cuff Size: Large)   Pulse (!) 50   Resp 16   Ht 5' 11 (1.803 m)   Wt  297 lb 1.6 oz (134.8 kg)   SpO2 96%   BMI 41.44 kg/m    Wt Readings from Last 3 Encounters:  06/19/24 297 lb 1.6 oz (134.8 kg)  06/12/23 293 lb (132.9 kg)  11/29/22 (!) 311 lb 3.2 oz (141.2 kg)    ***Physical Exam Studies Reviewed: SABRA     EKG:    EKG Interpretation Date/Time:  Thursday June 19 2024 14:56:29 EDT Ventricular Rate:  50 PR Interval:    QRS Duration:  104 QT Interval:  460 QTC Calculation: 419 R Axis:   -10  Text Interpretation: EKG 06/19/2024: Atrial fibrillation with controlled ventricular response at the rate of 50 bpm, single PVC.  Nonspecific T abnormality.  No significant change from 09/12/2015, previous heart rate was 80 bpm. Confirmed by Laterria Lasota, Jagadeesh ([id number]) on 06/19/2024 3:12:40 PM    Medications ordered    No orders of the defined types were placed in this encounter.    ASSESSMENT AND PLAN: .      ICD-10-CM   1. Permanent atrial fibrillation (HCC)  I48.21 EKG 12-Lead    2. Bradycardia by electrocardiogram  R00.1     3. Essential hypertension  I10     4. OSA (obstructive sleep apnea)  G47.33      Assessment & Plan      Signed,  Gordy Bergamo, MD, [facility] 06/19/2024, 9:11 PM [facility] [address] [place], [place] [address] Phone: [phone]. Fax:  [phone]

## 2024-06-19 NOTE — Patient Instructions (Signed)
 Medication Instructions:  Your physician recommends that you continue on your current medications as directed. Please refer to the Current Medication list given to you today.  *If you need a refill on your cardiac medications before your next appointment, please call your pharmacy*  Lab Work: NONE If you have labs (blood work) drawn today and your tests are completely normal, you will receive your results only by: MyChart Message (if you have MyChart) OR A paper copy in the mail If you have any lab test that is abnormal or we need to change your treatment, we will call you to review the results.  Testing/Procedures: NONE  Follow-Up: At [facility], you and your health needs are our priority.  As part of our continuing mission to provide you with exceptional heart care, our providers are all part of one team.  This team includes your primary Cardiologist (physician) and Advanced Practice Providers or APPs (Physician Assistants and Nurse Practitioners) who all work together to provide you with the care you need, when you need it.  Your next appointment:   AS NEEDED  Provider:   DR. LADONA  We recommend signing up for the patient portal called MyChart.  Sign up information is provided on this After Visit Summary.  MyChart is used to connect with patients for Virtual Visits (Telemedicine).  Patients are able to view lab/test results, encounter notes, upcoming appointments, etc.  Non-urgent messages can be sent to your provider as well.   To learn more about what you can do with MyChart, go to [email or web address].

## 2024-07-04 ENCOUNTER — Ambulatory Visit (INDEPENDENT_AMBULATORY_CARE_PROVIDER_SITE_OTHER): Payer: MEDICARE | Admitting: Podiatry

## 2024-07-04 DIAGNOSIS — M79672 Pain in left foot: Secondary | ICD-10-CM

## 2024-07-04 DIAGNOSIS — M79671 Pain in right foot: Secondary | ICD-10-CM | POA: Diagnosis not present

## 2024-07-04 DIAGNOSIS — B351 Tinea unguium: Secondary | ICD-10-CM

## 2024-07-04 NOTE — Progress Notes (Signed)
 Patient presents for evaluation and treatment of tenderness and some redness around nails feet.  Tenderness around toes with walking and wearing shoes.  Physical exam:  General appearance: Alert, pleasant, and in no acute distress.  Vascular: Pedal pulses: DP 1/4 B/L, PT 0/4 B/L.  Severe edema lower legs bilaterally  Neurological:    Dermatologic:  Nails thickened, disfigured, discolored 1-5 BL with subungual debris.  Redness and hypertrophic nail folds along nail folds bilaterally but no signs of drainage or infection.  Musculoskeletal:     Diagnosis: 1. Painful onychomycotic nails 1 through 5 bilaterally. 2. Pain toes 1 through 5 bilaterally.  Plan: Debrided onychomycotic nails 1 through 5 bilaterally.  Return 3 months [facility] to visit the Lyrica 1 okay and I will need to sign it with my

## 2024-10-06 ENCOUNTER — Ambulatory Visit: Payer: MEDICARE | Admitting: Podiatry

## 2024-11-26 ENCOUNTER — Ambulatory Visit (INDEPENDENT_AMBULATORY_CARE_PROVIDER_SITE_OTHER): Payer: MEDICARE | Admitting: Podiatry

## 2024-11-26 DIAGNOSIS — L853 Xerosis cutis: Secondary | ICD-10-CM | POA: Diagnosis not present

## 2024-11-26 DIAGNOSIS — B351 Tinea unguium: Secondary | ICD-10-CM

## 2024-11-26 DIAGNOSIS — E1151 Type 2 diabetes mellitus with diabetic peripheral angiopathy without gangrene: Secondary | ICD-10-CM

## 2024-11-26 MED ORDER — AMMONIUM LACTATE 12 % EX LOTN: 1.0000 | TOPICAL_LOTION | CUTANEOUS | 0 refills | Status: AC | PRN

## 2024-11-26 NOTE — Progress Notes (Signed)
"  °  Subjective:  Patient ID: Edwin [name], male    DOB: 1941/04/05,  MRN: [id number]  Chief Complaint  Patient presents with   Nail Problem    Nail trim  Pt stated that he is a diabetic and is on a blood thinner    84 y.o. male returns for the above complaint.  Patient presents with thickened and Largay dystrophic mycotic toenails x 10 mild pain on palpation worse with ambulation and shoe pressure patient would like to have a debridement unable to do it himself.  He also would like to discuss dry skin.  He states he has been doing this for quite some time is failed over-the-counter option he would like to discuss prescription options.  Objective:  There were no vitals filed for this visit. Podiatric Exam: Vascular: dorsalis pedis and posterior tibial pulses are palpable bilateral. Capillary return is immediate. Temperature gradient is WNL. Skin turgor WNL  Sensorium: Normal Semmes Weinstein monofilament test. Normal tactile sensation bilaterally. Nail Exam: Pt has thick disfigured discolored nails with subungual debris noted bilateral entire nail hallux through fifth toenails.  Pain on palpation to the nails. Ulcer Exam: There is no evidence of ulcer or pre-ulcerative changes or infection. Orthopedic Exam: Muscle tone and strength are WNL. No limitations in general ROM. No crepitus or effusions noted.  Skin: No Porokeratosis. No infection or ulcers.  Xerosis of both plantar skin noted no open wounds or lesion noted no fissures noted    Assessment & Plan:   1. 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 Pain due to onychomycosis of toenails of both feet   3. Xerosis of skin     Patient was evaluated and treated and all questions answered.  Xerosis skin bilateral -I explained to the patient the etiology of xerosis and various treatment options were extensively discussed.  I explained to the patient the importance of maintaining  moisturization of the skin with application of over-the-counter lotion such as Eucerin or Luciderm.  Given the amount of xerosis is present patient would benefit from ammonium lactate  ammonium lactate  was sent to the pharmacy encouraged him to apply twice a day he states understanding   Onychomycosis with pain  -Nails palliatively debrided as below. -Educated on self-care  Procedure: Nail Debridement Rationale: pain  Type of Debridement: manual, sharp debridement. Instrumentation: Nail nipper, rotary burr. Number of Nails: 10  Procedures and Treatment: Consent by patient was obtained for treatment procedures. The patient understood the discussion of treatment and procedures well. All questions were answered thoroughly reviewed. Debridement of mycotic and hypertrophic toenails, 1 through 5 bilateral and clearing of subungual debris. No ulceration, no infection noted.  Return Visit-Office Procedure: Patient instructed to return to the office for a follow up visit 3 months for continued evaluation and treatment.  Franky Blanch, DPM    Return in about 3 months (around 02/24/2025) for Routine Foot Care.  "

## 2024-12-11 ENCOUNTER — Other Ambulatory Visit (HOSPITAL_COMMUNITY): Payer: Self-pay

## 2024-12-11 ENCOUNTER — Ambulatory Visit: Payer: MEDICARE | Attending: Cardiology | Admitting: Cardiology

## 2024-12-11 ENCOUNTER — Encounter: Payer: Self-pay | Admitting: Cardiology

## 2024-12-11 VITALS — BP 115/60 | HR 64 | Ht 71.0 in | Wt 317.9 lb

## 2024-12-11 DIAGNOSIS — I4821 Permanent atrial fibrillation: Secondary | ICD-10-CM | POA: Diagnosis not present

## 2024-12-11 DIAGNOSIS — I5033 Acute on chronic diastolic (congestive) heart failure: Secondary | ICD-10-CM | POA: Insufficient documentation

## 2024-12-11 DIAGNOSIS — I10 Essential (primary) hypertension: Secondary | ICD-10-CM | POA: Insufficient documentation

## 2024-12-11 MED ORDER — TORSEMIDE 20 MG PO TABS
40.0000 mg | ORAL_TABLET | Freq: Every day | ORAL | 2 refills | Status: AC
  Filled 2024-12-11: qty 60, 30d supply, fill #0

## 2024-12-11 MED ORDER — VALSARTAN 160 MG PO TABS
160.0000 mg | ORAL_TABLET | Freq: Every evening | ORAL | 2 refills | Status: AC
  Filled 2024-12-11: qty 30, 30d supply, fill #0

## 2024-12-11 NOTE — Progress Notes (Signed)
 " Cardiology Office Note:  .   Date:  12/13/2024  ID:  Edwin [name], DOB December 07, 1940, MRN [id number] PCP: Pura Lenis, MD  [facility] Providers Cardiologist:  Gordy Bergamo, MD   History of Present Illness: .   Edwin [name] is a 84 y.o. with OSA on CPAP, hypertension, chronic leg edema, diabetes mellitus, and permanent atrial fibrillation on Coumadin , which is managed by his PCP, asymptomatic bradycardia paroxysmal episodes of junctional escape rhythm noted in 2023 nd recommended continued observation, chronic cor pulmonale, morbid obesity and has lost significant amount of weight since being on Ozempic a year ago presents for annual visit.   Echocardiogram 11/09/2022: Normal LVEF at 50 to 55% with mild LVH. RV is mildly dilated with mildly reduced RV function, RV systolic pressure 40 mmHg.     Discussed the use of AI scribe software for clinical note transcription with the patient, who gave verbal consent to proceed.  History of Present Illness Edwin [name] is an 84 year old male who presents with weight gain and fluid retention. He was referred by Dr. Alford for evaluation of weight gain and fluid retention.  He reports progressive weight gain that he believes is from fluid retention. He is on Ozempic 1 mL with reduced appetite after decreasing the dose previously due to feeling unwell.  He drinks three to four Bud Light beers nightly and eats sweets at night, which he thinks may contribute to weight gain.  He takes Lasix  two tablets in the morning and two in the afternoon but notes reduced urine output and urinary pressure. He suspects prostate obstruction and was started on medication to relax prostate muscles. He also takes allopurinol 300 mg for gout and has had no recent gout attacks.   Cardiac Studies relevent.    Cardiac Studies & Procedures   ______________________________________________________________________________________________ ECHOCARDIOGRAM COMPLETE  11/09/2022 Study Quality: Technically difficult study. Low normal LV systolic function with visual EF 50-55%. Left ventricle cavity is minimally dilated (upper limits). Mild concentric hypertrophy of the left ventricle. Hypokinetic global wall motion. Unable to evaluate diastolic function due to atrial fibrillation. Right atrial cavity is visually dilated. Right ventricle cavity is visually dilated and grossly systolic function is reduced. Moderate tricuspid regurgitation. Mild pulmonary hypertension. RVSP measures 40 mmHg. The aortic root is normal. Mildly dilated proximal ascending aorta., 38 mm IVC is dilated with respiratory variation, estimated RAP 1. Prior study 09/13/2015 LVEF 60-65% and right ventricle poorly visualized.   LONG TERM MONITOR (3-14 DAYS) 11/09/2022 [facility] Extended out patient EKG monitoring 14 days starting 10/19/2022: Predominant Rhythm : Atrial fibrillation Min HR: 24 bpm at 5 AM. Max HR 211 bpm at 10:30 PM Atrial arrhythmias:   Persistent atrial fibrillation Atrial fibrillation:   100% Ventricular arrhythmias: Occasional PVCs and ventricular bigeminy, ventricular couplets and 1 ventricular triplet, 4 beat and 6 beats of PVC, overall PVC Burden <1% Heart Block:    None Symptoms: 2 symptoms correlated with ventricular bigeminy and ventricular ectopics.  ___________________________________________________________________________________________     EKG:      Labs   Care everywhere/Faxed External Labs:  Labs 11/11/2024:  Serum glucose 108 mg, BUN 34, creatinine 1.57, eGFR 44 mL, potassium 4.6, LFTs normal.  Hb 9.5/HCT 28.9, platelets 150.  Compared to 02/06/2024, Hb 11.1/HCT 32.6.  ROS  Review of Systems  Constitutional: Positive for weight gain.  Cardiovascular:  Positive for leg swelling (chronic and increased recently). Negative for chest pain and dyspnea on exertion.   Physical Exam:   VS:  BP 115/60 (BP Location: Left Arm, Patient Position:  Sitting, Cuff Size: Large)   Pulse 64   Ht 5' 11 (1.803 m)   Wt (!) 317 lb 14.4 oz (144.2 kg)   SpO2 97%   BMI 44.34 kg/m    Wt Readings from Last 3 Encounters:  12/11/24 (!) 317 lb 14.4 oz (144.2 kg)  06/19/24 297 lb 1.6 oz (134.8 kg)  06/12/23 293 lb (132.9 kg)    BP Readings from Last 3 Encounters:  12/11/24 115/60  06/19/24 (!) 102/58  06/12/23 (!) 123/44   Physical Exam Neck:     Vascular: No carotid bruit or JVD.  Cardiovascular:     Rate and Rhythm: Regular rhythm. Bradycardia present.     Pulses: Intact distal pulses.     Heart sounds: Murmur heard.     Midsystolic murmur is present with a grade of 2/6 at the upper right sternal border.     No gallop.  Pulmonary:     Effort: Pulmonary effort is normal.     Breath sounds: Normal breath sounds.  Abdominal:     General: Bowel sounds are normal.     Palpations: Abdomen is soft.  Musculoskeletal:     Right lower leg: Edema (2-3+ pitting, chronic venous stasis changes noted) present.     Left lower leg: Edema (2-3+ pitting, chronic venous stasis changes noted) present.    ASSESSMENT AND PLAN: .      ICD-10-CM   1. Acute on chronic diastolic heart failure (HCC)  P49.66 torsemide  (DEMADEX ) 20 MG tablet    Basic metabolic panel with GFR    Pro b natriuretic peptide (BNP)    ECHOCARDIOGRAM COMPLETE    2. Permanent atrial fibrillation (HCC)  I48.21 Basic metabolic panel with GFR    Pro b natriuretic peptide (BNP)    ECHOCARDIOGRAM COMPLETE    3. Essential hypertension  I10 valsartan  (DIOVAN ) 160 MG tablet    Basic metabolic panel with GFR    Pro b natriuretic peptide (BNP)     Assessment & Plan Acute on chronic diastolic heart failure Fluid retention and weight gain likely due to decreased efficacy of Ozempic. Symptoms include swelling and reduced urination, possibly related to prostate issues. Current diuretic regimen may be insufficient. - Increased Ozempic to 2 mg dose. - Stopped furosemide . - Started  torsemide  20 mg, two tablets once a day. - Ordered cardiogram to assess function. - Ordered b BMP and NT proBNP in 10 days. - Advised to cut back on alcohol and snacks.  Essential hypertension Current antihypertensive regimen includes amlodipine  and valsartan  HCT. Plan to adjust medications to better manage blood pressure and fluid retention. - Stopped valsartan  HCT. - Started plain valsartan  at a reduced dose.  Gout Well-controlled with allopurinol 300 mg. No recent gout attacks reported. - Continue allopurinol 300 mg.   Follow up: CHF and weight gain in 6 weeks. He has now developed worsening anemia as well. Alcohol is contributing to his symptoms I think   Signed,  Gordy Bergamo, MD, [facility] 12/13/2024, 9:14 PM [facility] [address] [place], [place] [address] Phone: [phone]. Fax:  [phone]  "

## 2024-12-11 NOTE — Patient Instructions (Signed)
 Medication Instructions:  START  torsemide  (DEMADEX ) 20 MG tablet         Take 2 tablets (40 mg total) by mouth daily., Starting Thu 12/11/2024, Normal     valsartan  (DIOVAN ) 160 MG tablet        Take 1 tablet (160 mg total) by mouth every evening  STOP   *If you need a refill on your cardiac medications before your next appointment, please call your pharmacy*  Lab Work: (10 DAYS) Lab Orders         Basic metabolic panel with GFR         Pro b natriuretic peptide (BNP)     If you have labs (blood work) drawn today and your tests are completely normal, you will receive your results only by: MyChart Message (if you have MyChart) OR A paper copy in the mail If you have any lab test that is abnormal or we need to change your treatment, we will call you to review the results.  Testing/Procedures: ECHOCARDIOGRAM  Your physician has requested that you have an echocardiogram. Echocardiography is a painless test that uses sound waves to create images of your heart. It provides your doctor with information about the size and shape of your heart and how well your hearts chambers and valves are working. This procedure takes approximately one hour. There are no restrictions for this procedure. Please do NOT wear cologne, perfume, aftershave, or lotions (deodorant is allowed). Please arrive 15 minutes prior to your appointment time.  Please note: We ask at that you not bring children with you during ultrasound (echo/ vascular) testing. Due to room size and safety concerns, children are not allowed in the ultrasound rooms during exams. Our front office staff cannot provide observation of children in our lobby area while testing is being conducted. An adult accompanying a patient to their appointment will only be allowed in the ultrasound room at the discretion of the ultrasound technician under special circumstances. We apologize for any inconvenience.   Follow-Up: At [facility], you and  your health needs are our priority.  As part of our continuing mission to provide you with exceptional heart care, our providers are all part of one team.  This team includes your primary Cardiologist (physician) and Advanced Practice Providers or APPs (Physician Assistants and Nurse Practitioners) who all work together to provide you with the care you need, when you need it.  Your next appointment:   3 week(s)  Provider:   Gordy Bergamo, MD             We recommend signing up for the patient portal called MyChart.  Patients are able to view lab/test results, encounter notes, upcoming appointments, etc.  Non-urgent messages can be sent to your provider as well, go to [email or web address].

## 2024-12-15 ENCOUNTER — Ambulatory Visit: Payer: MEDICARE | Admitting: Cardiology

## 2024-12-16 ENCOUNTER — Other Ambulatory Visit (HOSPITAL_COMMUNITY): Payer: Self-pay

## 2025-01-01 ENCOUNTER — Ambulatory Visit: Payer: MEDICARE | Admitting: Cardiology

## 2025-01-07 ENCOUNTER — Ambulatory Visit (HOSPITAL_COMMUNITY): Payer: MEDICARE

## 2025-01-14 ENCOUNTER — Ambulatory Visit: Payer: MEDICARE | Admitting: Cardiology

## 2025-01-16 ENCOUNTER — Ambulatory Visit: Payer: MEDICARE | Admitting: Emergency Medicine

## 2025-02-24 ENCOUNTER — Ambulatory Visit: Payer: MEDICARE | Admitting: Podiatry

## 2025-03-17 ENCOUNTER — Telehealth: Payer: MEDICARE | Admitting: Adult Health
